# Patient Record
Sex: Female | Born: 1950
Health system: Southern US, Community
[De-identification: ages and names within clinical notes are randomized; demographics above are authoritative.]

## PROBLEM LIST (undated history)

## (undated) DIAGNOSIS — F419 Anxiety disorder, unspecified: Secondary | ICD-10-CM

## (undated) DIAGNOSIS — K8020 Calculus of gallbladder without cholecystitis without obstruction: Secondary | ICD-10-CM

## (undated) DIAGNOSIS — IMO0001 Reserved for inherently not codable concepts without codable children: Secondary | ICD-10-CM

## (undated) DIAGNOSIS — E669 Obesity, unspecified: Secondary | ICD-10-CM

## (undated) DIAGNOSIS — K589 Irritable bowel syndrome without diarrhea: Secondary | ICD-10-CM

## (undated) DIAGNOSIS — I10 Essential (primary) hypertension: Secondary | ICD-10-CM

## (undated) DIAGNOSIS — M5430 Sciatica, unspecified side: Secondary | ICD-10-CM

## (undated) DIAGNOSIS — E785 Hyperlipidemia, unspecified: Secondary | ICD-10-CM

## (undated) HISTORY — DX: Hyperlipidemia, unspecified: E78.5

## (undated) HISTORY — DX: Reserved for inherently not codable concepts without codable children: IMO0001

## (undated) HISTORY — DX: Irritable bowel syndrome, unspecified: K58.9

## (undated) HISTORY — DX: Anxiety disorder, unspecified: F41.9

## (undated) HISTORY — DX: Obesity, unspecified: E66.9

## (undated) HISTORY — DX: Sciatica, unspecified side: M54.30

## (undated) HISTORY — DX: Essential (primary) hypertension: I10

## (undated) HISTORY — DX: Calculus of gallbladder without cholecystitis without obstruction: K80.20

---

## 1997-05-30 ENCOUNTER — Ambulatory Visit (HOSPITAL_COMMUNITY): Admission: RE | Admit: 1997-05-30 | Discharge: 1997-05-30 | Payer: Self-pay | Admitting: Obstetrics & Gynecology

## 1998-06-13 ENCOUNTER — Other Ambulatory Visit: Admission: RE | Admit: 1998-06-13 | Discharge: 1998-06-13 | Payer: Self-pay | Admitting: Obstetrics & Gynecology

## 1999-11-04 ENCOUNTER — Ambulatory Visit (HOSPITAL_COMMUNITY): Admission: RE | Admit: 1999-11-04 | Discharge: 1999-11-04 | Payer: Self-pay | Admitting: Obstetrics & Gynecology

## 1999-11-04 ENCOUNTER — Encounter: Payer: Self-pay | Admitting: Obstetrics & Gynecology

## 1999-11-18 ENCOUNTER — Other Ambulatory Visit: Admission: RE | Admit: 1999-11-18 | Discharge: 1999-11-18 | Payer: Self-pay | Admitting: Obstetrics & Gynecology

## 2000-01-08 ENCOUNTER — Encounter (INDEPENDENT_AMBULATORY_CARE_PROVIDER_SITE_OTHER): Payer: Self-pay

## 2000-01-08 ENCOUNTER — Other Ambulatory Visit: Admission: RE | Admit: 2000-01-08 | Discharge: 2000-01-08 | Payer: Self-pay | Admitting: Obstetrics and Gynecology

## 2000-12-25 ENCOUNTER — Ambulatory Visit (HOSPITAL_COMMUNITY): Admission: RE | Admit: 2000-12-25 | Discharge: 2000-12-25 | Payer: Self-pay | Admitting: Obstetrics and Gynecology

## 2000-12-25 ENCOUNTER — Encounter: Payer: Self-pay | Admitting: Obstetrics and Gynecology

## 2001-03-03 ENCOUNTER — Other Ambulatory Visit: Admission: RE | Admit: 2001-03-03 | Discharge: 2001-03-03 | Payer: Self-pay | Admitting: Obstetrics and Gynecology

## 2002-03-16 ENCOUNTER — Other Ambulatory Visit: Admission: RE | Admit: 2002-03-16 | Discharge: 2002-03-16 | Payer: Self-pay | Admitting: Obstetrics and Gynecology

## 2002-04-05 ENCOUNTER — Encounter: Payer: Self-pay | Admitting: Obstetrics and Gynecology

## 2002-04-05 ENCOUNTER — Ambulatory Visit (HOSPITAL_COMMUNITY): Admission: RE | Admit: 2002-04-05 | Discharge: 2002-04-05 | Payer: Self-pay | Admitting: Obstetrics and Gynecology

## 2003-06-06 ENCOUNTER — Other Ambulatory Visit: Admission: RE | Admit: 2003-06-06 | Discharge: 2003-06-06 | Payer: Self-pay | Admitting: Obstetrics and Gynecology

## 2003-06-08 ENCOUNTER — Ambulatory Visit (HOSPITAL_COMMUNITY): Admission: RE | Admit: 2003-06-08 | Discharge: 2003-06-08 | Payer: Self-pay | Admitting: Obstetrics and Gynecology

## 2004-06-11 ENCOUNTER — Other Ambulatory Visit: Admission: RE | Admit: 2004-06-11 | Discharge: 2004-06-11 | Payer: Self-pay | Admitting: Obstetrics and Gynecology

## 2004-06-28 ENCOUNTER — Ambulatory Visit (HOSPITAL_COMMUNITY): Admission: RE | Admit: 2004-06-28 | Discharge: 2004-06-28 | Payer: Self-pay | Admitting: Obstetrics and Gynecology

## 2004-07-22 ENCOUNTER — Ambulatory Visit: Payer: Self-pay | Admitting: Internal Medicine

## 2004-07-26 ENCOUNTER — Ambulatory Visit: Payer: Self-pay | Admitting: Internal Medicine

## 2004-10-01 ENCOUNTER — Ambulatory Visit: Payer: Self-pay | Admitting: Internal Medicine

## 2004-11-15 ENCOUNTER — Ambulatory Visit: Payer: Self-pay | Admitting: Internal Medicine

## 2005-02-05 ENCOUNTER — Ambulatory Visit: Payer: Self-pay | Admitting: Internal Medicine

## 2005-04-04 ENCOUNTER — Ambulatory Visit: Payer: Self-pay | Admitting: Internal Medicine

## 2005-07-10 ENCOUNTER — Other Ambulatory Visit: Admission: RE | Admit: 2005-07-10 | Discharge: 2005-07-10 | Payer: Self-pay | Admitting: Obstetrics and Gynecology

## 2005-07-28 ENCOUNTER — Ambulatory Visit: Payer: Self-pay | Admitting: Gastroenterology

## 2005-07-28 ENCOUNTER — Ambulatory Visit (HOSPITAL_COMMUNITY): Admission: RE | Admit: 2005-07-28 | Discharge: 2005-07-28 | Payer: Self-pay | Admitting: Obstetrics and Gynecology

## 2005-09-12 ENCOUNTER — Ambulatory Visit: Payer: Self-pay | Admitting: Gastroenterology

## 2005-09-13 ENCOUNTER — Encounter (INDEPENDENT_AMBULATORY_CARE_PROVIDER_SITE_OTHER): Payer: Self-pay | Admitting: Specialist

## 2005-10-21 ENCOUNTER — Ambulatory Visit: Payer: Self-pay | Admitting: Gastroenterology

## 2006-01-14 ENCOUNTER — Ambulatory Visit: Payer: Self-pay | Admitting: Internal Medicine

## 2006-01-22 ENCOUNTER — Ambulatory Visit: Payer: Self-pay | Admitting: Internal Medicine

## 2006-01-27 ENCOUNTER — Encounter: Admission: RE | Admit: 2006-01-27 | Discharge: 2006-01-27 | Payer: Self-pay | Admitting: Internal Medicine

## 2006-02-10 DIAGNOSIS — IMO0001 Reserved for inherently not codable concepts without codable children: Secondary | ICD-10-CM

## 2006-02-10 HISTORY — PX: NM MYOVIEW LTD: HXRAD82

## 2006-02-10 HISTORY — DX: Reserved for inherently not codable concepts without codable children: IMO0001

## 2006-02-16 ENCOUNTER — Ambulatory Visit: Payer: Self-pay

## 2006-02-16 ENCOUNTER — Encounter: Payer: Self-pay | Admitting: Internal Medicine

## 2006-12-31 ENCOUNTER — Telehealth: Payer: Self-pay | Admitting: Internal Medicine

## 2007-01-12 ENCOUNTER — Ambulatory Visit: Payer: Self-pay | Admitting: Internal Medicine

## 2007-01-12 DIAGNOSIS — I10 Essential (primary) hypertension: Secondary | ICD-10-CM | POA: Insufficient documentation

## 2007-01-12 DIAGNOSIS — R29818 Other symptoms and signs involving the nervous system: Secondary | ICD-10-CM | POA: Insufficient documentation

## 2007-01-12 DIAGNOSIS — J309 Allergic rhinitis, unspecified: Secondary | ICD-10-CM | POA: Insufficient documentation

## 2007-01-12 DIAGNOSIS — E785 Hyperlipidemia, unspecified: Secondary | ICD-10-CM | POA: Insufficient documentation

## 2007-02-23 ENCOUNTER — Ambulatory Visit: Payer: Self-pay | Admitting: Internal Medicine

## 2007-02-23 LAB — CONVERTED CEMR LAB
BUN: 12 mg/dL (ref 6–23)
Basophils Relative: 0.5 % (ref 0.0–1.0)
Bilirubin, Direct: 0.2 mg/dL (ref 0.0–0.3)
CO2: 25 meq/L (ref 19–32)
Cholesterol: 172 mg/dL (ref 0–200)
Creatinine,U: 143.4 mg/dL
GFR calc Af Amer: 96 mL/min
Glucose, Bld: 170 mg/dL — ABNORMAL HIGH (ref 70–99)
HCT: 41 % (ref 36.0–46.0)
HDL: 54.3 mg/dL (ref >39.0)
Hemoglobin: 14.6 g/dL (ref 12.0–15.0)
Lymphocytes Relative: 32.4 % (ref 12.0–46.0)
Microalb Creat Ratio: 9.8 mg/g (ref 0.0–30.0)
Microalb, Ur: 1.4 mg/dL (ref 0.0–1.9)
Monocytes Absolute: 0.4 10*3/uL (ref 0.2–0.7)
Monocytes Relative: 6.4 % (ref 3.0–11.0)
Neutro Abs: 4.2 10*3/uL (ref 1.4–7.7)
Neutrophils Relative %: 58.8 % (ref 43.0–77.0)
Potassium: 5.2 meq/L — ABNORMAL HIGH (ref 3.5–5.1)
Sodium: 138 meq/L (ref 135–145)
Total Bilirubin: 0.9 mg/dL (ref 0.3–1.2)
Total Protein: 6.6 g/dL (ref 6.0–8.3)
VLDL: 26 mg/dL (ref 0–40)

## 2007-03-12 ENCOUNTER — Telehealth: Payer: Self-pay | Admitting: Internal Medicine

## 2007-03-22 ENCOUNTER — Ambulatory Visit: Payer: Self-pay | Admitting: Internal Medicine

## 2007-03-22 DIAGNOSIS — E559 Vitamin D deficiency, unspecified: Secondary | ICD-10-CM | POA: Insufficient documentation

## 2007-03-22 DIAGNOSIS — H9209 Otalgia, unspecified ear: Secondary | ICD-10-CM | POA: Insufficient documentation

## 2007-03-22 LAB — CONVERTED CEMR LAB
HDL goal, serum: 40 mg/dL
LDL Goal: 100 mg/dL

## 2007-03-30 ENCOUNTER — Encounter: Payer: Self-pay | Admitting: Internal Medicine

## 2007-04-15 ENCOUNTER — Encounter: Admission: RE | Admit: 2007-04-15 | Discharge: 2007-04-15 | Payer: Self-pay | Admitting: Otolaryngology

## 2007-04-28 ENCOUNTER — Encounter: Payer: Self-pay | Admitting: Internal Medicine

## 2007-06-29 ENCOUNTER — Ambulatory Visit: Payer: Self-pay | Admitting: Internal Medicine

## 2007-06-29 LAB — CONVERTED CEMR LAB: Hgb A1c MFr Bld: 6.9 % — ABNORMAL HIGH (ref 4.6–6.0)

## 2007-07-07 ENCOUNTER — Ambulatory Visit: Payer: Self-pay | Admitting: Internal Medicine

## 2007-07-07 DIAGNOSIS — M25519 Pain in unspecified shoulder: Secondary | ICD-10-CM | POA: Insufficient documentation

## 2007-07-07 DIAGNOSIS — M25559 Pain in unspecified hip: Secondary | ICD-10-CM | POA: Insufficient documentation

## 2007-12-10 ENCOUNTER — Ambulatory Visit: Payer: Self-pay | Admitting: Internal Medicine

## 2007-12-10 LAB — CONVERTED CEMR LAB
Cholesterol: 175 mg/dL (ref 0–200)
Hgb A1c MFr Bld: 7.4 % — ABNORMAL HIGH (ref 4.6–6.0)
Microalb Creat Ratio: 2.6 mg/g (ref 0.0–30.0)
Microalb, Ur: 0.2 mg/dL (ref 0.0–1.9)
Triglycerides: 191 mg/dL — ABNORMAL HIGH (ref 0–149)

## 2007-12-14 ENCOUNTER — Ambulatory Visit: Payer: Self-pay | Admitting: Internal Medicine

## 2008-04-14 ENCOUNTER — Ambulatory Visit: Payer: Self-pay | Admitting: Internal Medicine

## 2008-04-14 LAB — CONVERTED CEMR LAB
AST: 29 units/L (ref 0–37)
Albumin: 3.7 g/dL (ref 3.5–5.2)
Alkaline Phosphatase: 94 units/L (ref 39–117)
Bilirubin, Direct: 0.1 mg/dL (ref 0.0–0.3)
Cholesterol: 184 mg/dL (ref 0–200)
Total CHOL/HDL Ratio: 3.2
Total Protein: 7 g/dL (ref 6.0–8.3)
Triglycerides: 159 mg/dL — ABNORMAL HIGH (ref 0–149)

## 2008-04-28 ENCOUNTER — Ambulatory Visit: Payer: Self-pay | Admitting: Internal Medicine

## 2008-06-05 ENCOUNTER — Telehealth: Payer: Self-pay | Admitting: *Deleted

## 2008-07-12 ENCOUNTER — Ambulatory Visit: Payer: Self-pay | Admitting: Internal Medicine

## 2008-07-12 DIAGNOSIS — K589 Irritable bowel syndrome without diarrhea: Secondary | ICD-10-CM | POA: Insufficient documentation

## 2008-07-12 DIAGNOSIS — M79609 Pain in unspecified limb: Secondary | ICD-10-CM | POA: Insufficient documentation

## 2008-07-17 ENCOUNTER — Ambulatory Visit: Payer: Self-pay | Admitting: Internal Medicine

## 2008-07-21 ENCOUNTER — Ambulatory Visit (HOSPITAL_COMMUNITY): Admission: RE | Admit: 2008-07-21 | Discharge: 2008-07-21 | Payer: Self-pay | Admitting: Obstetrics and Gynecology

## 2008-09-05 ENCOUNTER — Ambulatory Visit: Payer: Self-pay | Admitting: Internal Medicine

## 2008-09-05 LAB — CONVERTED CEMR LAB
BUN: 12 mg/dL (ref 6–23)
CO2: 27 meq/L (ref 19–32)
Creatinine,U: 211.2 mg/dL
GFR calc non Af Amer: 91.9 mL/min (ref >60)
Glucose, Bld: 128 mg/dL — ABNORMAL HIGH (ref 70–99)
Microalb Creat Ratio: 2.4 mg/g (ref 0.0–30.0)
Potassium: 3.9 meq/L (ref 3.5–5.1)
Sodium: 141 meq/L (ref 135–145)
Vit D, 25-Hydroxy: 22 ng/mL — ABNORMAL LOW (ref 30–89)

## 2008-09-14 ENCOUNTER — Telehealth: Payer: Self-pay | Admitting: Internal Medicine

## 2008-09-18 ENCOUNTER — Encounter: Payer: Self-pay | Admitting: Internal Medicine

## 2008-10-09 ENCOUNTER — Ambulatory Visit: Payer: Self-pay | Admitting: Internal Medicine

## 2008-10-09 DIAGNOSIS — M5430 Sciatica, unspecified side: Secondary | ICD-10-CM | POA: Insufficient documentation

## 2008-10-09 DIAGNOSIS — R946 Abnormal results of thyroid function studies: Secondary | ICD-10-CM | POA: Insufficient documentation

## 2008-10-09 DIAGNOSIS — K8020 Calculus of gallbladder without cholecystitis without obstruction: Secondary | ICD-10-CM | POA: Insufficient documentation

## 2008-11-03 ENCOUNTER — Ambulatory Visit: Payer: Self-pay | Admitting: Internal Medicine

## 2008-11-09 LAB — CONVERTED CEMR LAB
AST: 28 units/L (ref 0–37)
Albumin: 3.6 g/dL (ref 3.5–5.2)
Free T4: 0.9 ng/dL (ref 0.6–1.6)
T3, Free: 2.1 pg/mL — ABNORMAL LOW (ref 2.3–4.2)
TSH: 1.06 microintl units/mL (ref 0.35–5.50)
Total Bilirubin: 0.8 mg/dL (ref 0.3–1.2)

## 2008-12-01 ENCOUNTER — Ambulatory Visit: Payer: Self-pay | Admitting: Endocrinology

## 2009-03-21 ENCOUNTER — Telehealth: Payer: Self-pay | Admitting: Internal Medicine

## 2009-03-27 ENCOUNTER — Ambulatory Visit: Payer: Self-pay | Admitting: Internal Medicine

## 2009-03-27 LAB — CONVERTED CEMR LAB
Albumin: 3.5 g/dL (ref 3.5–5.2)
CO2: 27 meq/L (ref 19–32)
Chloride: 106 meq/L (ref 96–112)
Creatinine, Ser: 0.7 mg/dL (ref 0.4–1.2)
Free T4: 1 ng/dL (ref 0.6–1.6)
Hgb A1c MFr Bld: 8 % — ABNORMAL HIGH (ref 4.6–6.5)
Potassium: 4.1 meq/L (ref 3.5–5.1)
Sodium: 138 meq/L (ref 135–145)
T3, Free: 2.8 pg/mL (ref 2.3–4.2)
TSH: 0.92 microintl units/mL (ref 0.35–5.50)
Total Protein: 6.7 g/dL (ref 6.0–8.3)
Vit D, 25-Hydroxy: 44 ng/mL (ref 30–89)

## 2009-04-03 ENCOUNTER — Ambulatory Visit: Payer: Self-pay | Admitting: Internal Medicine

## 2009-04-03 DIAGNOSIS — R05 Cough: Secondary | ICD-10-CM

## 2009-04-03 DIAGNOSIS — R059 Cough, unspecified: Secondary | ICD-10-CM | POA: Insufficient documentation

## 2009-05-25 ENCOUNTER — Ambulatory Visit: Payer: Self-pay | Admitting: Internal Medicine

## 2009-05-25 DIAGNOSIS — M722 Plantar fascial fibromatosis: Secondary | ICD-10-CM | POA: Insufficient documentation

## 2009-09-03 ENCOUNTER — Ambulatory Visit: Payer: Self-pay | Admitting: Internal Medicine

## 2009-09-03 LAB — CONVERTED CEMR LAB
ALT: 22 units/L (ref 0–35)
AST: 21 units/L (ref 0–37)
Albumin: 3.7 g/dL (ref 3.5–5.2)
BUN: 13 mg/dL (ref 6–23)
Cholesterol: 233 mg/dL — ABNORMAL HIGH (ref 0–200)
Creatinine, Ser: 0.7 mg/dL (ref 0.4–1.2)
GFR calc non Af Amer: 94.69 mL/min (ref >60)
Glucose, Bld: 208 mg/dL — ABNORMAL HIGH (ref 70–99)
HDL: 57 mg/dL (ref >39.00)
Hgb A1c MFr Bld: 7.4 % — ABNORMAL HIGH (ref 4.6–6.5)
Microalb Creat Ratio: 1.1 mg/g (ref 0.0–30.0)
Microalb, Ur: 2.6 mg/dL — ABNORMAL HIGH (ref 0.0–1.9)
Potassium: 4.8 meq/L (ref 3.5–5.1)
VLDL: 57 mg/dL — ABNORMAL HIGH (ref 0.0–40.0)

## 2009-09-10 ENCOUNTER — Ambulatory Visit: Payer: Self-pay | Admitting: Internal Medicine

## 2009-09-10 DIAGNOSIS — E669 Obesity, unspecified: Secondary | ICD-10-CM | POA: Insufficient documentation

## 2010-01-14 ENCOUNTER — Ambulatory Visit: Payer: Self-pay | Admitting: Internal Medicine

## 2010-01-14 LAB — CONVERTED CEMR LAB
AST: 31 units/L (ref 0–37)
Albumin: 3.6 g/dL (ref 3.5–5.2)
Alkaline Phosphatase: 98 units/L (ref 39–117)
Cholesterol: 221 mg/dL — ABNORMAL HIGH (ref 0–200)
Direct LDL: 132.6 mg/dL
Total Protein: 6.4 g/dL (ref 6.0–8.3)

## 2010-01-21 ENCOUNTER — Ambulatory Visit: Payer: Self-pay | Admitting: Internal Medicine

## 2010-01-21 DIAGNOSIS — Z9119 Patient's noncompliance with other medical treatment and regimen: Secondary | ICD-10-CM | POA: Insufficient documentation

## 2010-03-03 ENCOUNTER — Encounter: Payer: Self-pay | Admitting: Neurology

## 2010-03-03 ENCOUNTER — Encounter: Payer: Self-pay | Admitting: Otolaryngology

## 2010-03-10 LAB — CONVERTED CEMR LAB
AST: 19 units/L (ref 0–37)
BUN: 12 mg/dL (ref 6–23)
Basophils Absolute: 0.1 10*3/uL (ref 0.0–0.1)
Creatinine,U: 45.1 mg/dL
Glomerular Filtration Rate, Af Am: 96 mL/min/{1.73_m2}
HCT: 43.5 % (ref 36.0–46.0)
Hemoglobin: 14.9 g/dL (ref 12.0–15.0)
MCHC: 34.2 g/dL (ref 30.0–36.0)
MCV: 90.4 fL (ref 78.0–100.0)
Microalb, Ur: 0.2 mg/dL (ref 0.0–1.9)
Monocytes Absolute: 0.6 10*3/uL (ref 0.2–0.7)
Neutrophils Relative %: 63.4 % (ref 43.0–77.0)
Potassium: 3.7 meq/L (ref 3.5–5.1)
Pro B Natriuretic peptide (BNP): 7 pg/mL (ref 0.0–100.0)
TSH: 0.52 microintl units/mL (ref 0.35–5.50)

## 2010-03-14 NOTE — Assessment & Plan Note (Signed)
Summary: 3 month ov//ccm/pt rsc/cjr   Vital Signs:  Patient profile:   60 year old female Menstrual status:  postmenopausal Height:      63 inches Weight:      205 pounds BMI:     36.45 Pulse rate:   80 / minute BP sitting:   112 / 70  (left arm) Cuff size:   large  Vitals Entered By: Romualdo Bolk, CMA (AAMA) (January 21, 2010 9:02 AM) CC: Follow-up visit on labs, Hypertension Management   History of Present Illness: [name] [name] comes in today  for follow up of multiple medical problems . last visit was August 2011.  LIPIDS: taking meds ?  how often no se  DM: not taking the onglyza   cost lus other reasongs  No low s   COUGH :  x 2 weeks  began as runny nose  and inc in am  trying delsym.     no fever cp sob.  denoes med effect  HT :  controlled?Marland Kitchen   Hypertension History:      She denies headache, chest pain, palpitations, dyspnea with exertion, orthopnea, PND, peripheral edema, visual symptoms, neurologic problems, syncope, and side effects from treatment.  She notes no problems with any antihypertensive medication side effects.        Positive major cardiovascular risk factors include female age 58 years old or older, diabetes, hyperlipidemia, and hypertension.  Negative major cardiovascular risk factors include non-tobacco-user status.        Further assessment for target organ damage reveals no history of ASHD, stroke/TIA, or peripheral vascular disease.      Preventive Screening-Counseling & Management  Alcohol-Tobacco     Alcohol drinks/day: 0     Smoking Status: never  Caffeine-Diet-Exercise     Caffeine use/day: 2     Does Patient Exercise: no  Current Medications (verified): 1)  Freestyle Test   Strp (Glucose Blood) .... Use As Directed 2)  Freestyle Unistick Ii Lancets   Misc (Lancets) .... Use As Directed 3)  Amaryl 2 Mg  Tabs (Glimepiride) .Marland Kitchen.. 1 By Mouth Once Daily 4)  Actoplus Met 15-850 Mg  Tabs (Pioglitazone Hcl-Metformin Hcl) .Marland Kitchen.. 1 By Mouth  Two Times A Day 5)  Multivitamins   Tabs (Multiple Vitamin) 6)  Zocor 20 Mg Tabs (Simvastatin) .Marland Kitchen.. 1 By Mouth Once Daily 7)  Glycopyrrolate 2 Mg Tabs (Glycopyrrolate) .Marland Kitchen.. 1 By Mouth Two Times A Day Ibs 8)  Arthrotec 75 75-200 Mg-Mcg Tabs (Diclofenac-Misoprostol) .... As Needed 9)  Losartan Potassium 50 Mg Tabs (Losartan Potassium) .Marland Kitchen.. 1 By Mouth Once Daily For Blood Pressure 10)  Onglyza 5 Mg Tabs (Saxagliptin Hcl) .Marland Kitchen.. 1 By Mouth Once Daily  Allergies (verified): 1)  Naprosyn  Past History:  Past medical, surgical, family and social histories (including risk factors) reviewed, and no changes noted (except as noted below).  Past Medical History: Reviewed history from 10/09/2008 and no changes required. Allergic rhinitis Diabetes mellitus, type II Hyperlipidemia Hypertension myoview low risk study  1/08  IBS  Sciatica   Past Surgical History: Reviewed history from 03/22/2007 and no changes required. C-Sections x [phone]  Past History:  Care Management: Neurology: [facility] Gastroenterology: Dr. Russella Dar Gynecology: Dr. Pennie Rushing Orthopedics: [facility]  Family History: Reviewed history from 12/01/2008 and no changes required. [place] Family History Lung cancer  MGF   MGM Family History Diabetes 1st degree relative father , PGM  Family History Hypertension   MOM died when pt was 71 of AML aunt with  breast cancer age 49 no thyroid problem  Social History: Reviewed history from 05/25/2009 and no changes required. Never Smoked separated single mom Alcohol use-no Drug use-no Regular exercise-no hh of 4  works for [employer] co   sits all day at terminal works [employer]  Review of Systems  The patient denies anorexia, fever, weight loss, weight gain, vision loss, decreased hearing, hoarseness, chest pain, headaches, abdominal pain, melena, hematochezia, severe indigestion/heartburn, transient blindness, difficulty walking, enlarged lymph nodes, angioedema, and  breast masses.         no falling   Physical Exam  General:  Well-developed,well-nourished,in no acute distress; alert,appropriate and cooperative throughout examination Head:  Normocephalic and atraumatic without obvious abnormalities. No apparent alopecia or balding. Eyes:  clear  without discharge  Ears:  R ear normal, L ear normal, and no external deformities.   Nose:  no external deformity and no nasal discharge.   Mouth:  pharynx pink and moist.   Neck:  No deformities, masses, or tenderness noted. Lungs:  Normal respiratory effort, chest expands symmetrically. Lungs are clear to auscultation, no crackles or wheezes.no dullness.   Heart:  Normal rate and regular rhythm. S1 and S2 normal without gallop, murmur, click, rub or other extra sounds.no lifts.   Abdomen:  Bowel sounds positive,abdomen soft and non-tender without masses, organomegaly or hernias noted. Pulses:  pulses intact without delay   Neurologic:  grossly non focal  Cervical Nodes:  No lymphadenopathy noted Psych:  Oriented X3, normally interactive, good eye contact, not anxious appearing, and not depressed appearing.     Impression & Recommendations:  Problem # 1:  DIABETES MELLITUS, TYPE II (ICD-250.00) Assessment Deteriorated compliance/ adherance an issues for various reasons    actos expensive  a nd  risk  asks about  options . Last eye check 2009  should get a n updated one.   consider  swith to plain metformin  but  lifestyle intervention central to success. Her updated medication list for this problem includes:    Amaryl 2 Mg Tabs (Glimepiride) .Marland Kitchen... 1 by mouth once daily    Actoplus Met 15-850 Mg Tabs (Pioglitazone hcl-metformin hcl) .Marland Kitchen... 1 by mouth two times a day    Losartan Potassium 50 Mg Tabs (Losartan potassium) .Marland Kitchen... 1 by mouth once daily for blood pressure    Onglyza 5 Mg Tabs (Saxagliptin hcl) .Marland Kitchen... 1 by mouth once daily  Labs Reviewed: Creat: 0.7 (09/03/2009)     Last Eye Exam: normal  (07/12/2007) Reviewed HgBA1c results: 8.3 (01/14/2010)  7.4 (09/03/2009)  Problem # 2:  HYPERTENSION (ICD-401.9) Assessment: Unchanged  Her updated medication list for this problem includes:    Losartan Potassium 50 Mg Tabs (Losartan potassium) .Marland Kitchen... 1 by mouth once daily for blood pressure  BP today: 112/70 Prior BP: 130/80 (09/10/2009)  10 Yr Risk Heart Disease: 7 % Prior 10 Yr Risk Heart Disease: 11 % (04/28/2008)  Labs Reviewed: K+: 4.8 (09/03/2009) Creat: : 0.7 (09/03/2009)   Chol: 221 (01/14/2010)   HDL: 57.70 (01/14/2010)   LDL: 95 (04/14/2008)   TG: 139.0 (01/14/2010)  Problem # 3:  COUGH (ICD-786.2) Assessment: New ? viral vs allergic   cause.Marland Kitchen u pperairway  syndrome ..   disc rx options  Problem # 4:  HYPERLIPIDEMIA (ICD-272.4)  Her updated medication list for this problem includes:    Zocor 20 Mg Tabs (Simvastatin) .Marland Kitchen... 1 by mouth once daily  Labs Reviewed: SGOT: 31 (01/14/2010)   SGPT: 36 (01/14/2010)  Lipid Goals: Chol Goal:  200 (03/22/2007)   HDL Goal: 40 (03/22/2007)   LDL Goal: 100 (03/22/2007)   TG Goal: 150 (03/22/2007)  10 Yr Risk Heart Disease: 7 % Prior 10 Yr Risk Heart Disease: 11 % (04/28/2008)   HDL:57.70 (01/14/2010), 57.00 (09/03/2009)  LDL:95 (04/14/2008), 82 (12/10/2007)  Chol:221 (01/14/2010), 233 (09/03/2009)  Trig:139.0 (01/14/2010), 285.0 (09/03/2009)  Problem # 5:  NON ADHERENCE  TO MEDICATION (ICD-V15.81) Assessment: New reviewed interventions  to ensure adherance/ compliance   Complete Medication List: 1)  Freestyle Test Strp (Glucose blood) .... Use as directed 2)  Freestyle Unistick Ii Lancets Misc (Lancets) .... Use as directed 3)  Amaryl 2 Mg Tabs (Glimepiride) .Marland Kitchen.. 1 by mouth once daily 4)  Actoplus Met 15-850 Mg Tabs (Pioglitazone hcl-metformin hcl) .Marland Kitchen.. 1 by mouth two times a day 5)  Multivitamins Tabs (Multiple vitamin) 6)  Zocor 20 Mg Tabs (Simvastatin) .Marland Kitchen.. 1 by mouth once daily 7)  Glycopyrrolate 2 Mg Tabs  (Glycopyrrolate) .Marland Kitchen.. 1 by mouth two times a day ibs 8)  Arthrotec 75 75-200 Mg-mcg Tabs (Diclofenac-misoprostol) .... As needed 9)  Losartan Potassium 50 Mg Tabs (Losartan potassium) .Marland Kitchen.. 1 by mouth once daily for blood pressure 10)  Onglyza 5 Mg Tabs (Saxagliptin hcl) .Marland Kitchen.. 1 by mouth once daily 11)  Hydrocodone-homatropine 5-1.5 Mg/71ml Syrp (Hydrocodone-homatropine) .Marland Kitchen.. 1-2 tsp by mouth q4-6 hours as needed cough  Other Orders: Admin 1st Vaccine (95188) Flu Vaccine 50yrs + (41660) Pneumococcal Vaccine (63016) Admin of Any Addtl Vaccine (01093)  Hypertension Assessment/Plan:      The patient's hypertensive risk group is category C: Target organ damage and/or diabetes.  Her calculated 10 year risk of coronary heart disease is 7 %.  Today's blood pressure is 112/70.  Her blood pressure goal is < 130/80.  Patient Instructions: 1)  start the onglyza . 2)  if want to  can change to plain metformin  call for this if want to stop the actos  3)  Cough med as needed antihistamine  at night may help for dripping.  4)  Take meds regularly.  5)  LIPDS , Hg a1c , LFTS , BMP, in 3 months   6)  ROV in 3 months  Prescriptions: HYDROCODONE-HOMATROPINE 5-1.5 MG/5ML SYRP (HYDROCODONE-HOMATROPINE) 1-2 tsp by mouth q4-6 hours as needed cough  #6 oz x 0   Entered and Authorized by:   Madelin Headings MD   Signed by:   Madelin Headings MD on 01/21/2010   Method used:   Print then Give to Patient   RxID:   [id number] ONGLYZA 5 MG TABS (SAXAGLIPTIN HCL) 1 by mouth once daily  #30 x 6   Entered and Authorized by:   Madelin Headings MD   Signed by:   Madelin Headings MD on 01/21/2010   Method used:   Print then Give to Patient   RxID:   [id number]    Orders Added: 1)  Admin 1st Vaccine [90471] 2)  Flu Vaccine 38yrs + [37106] 3)  Pneumococcal Vaccine [26948] 4)  Admin of Any Addtl Vaccine [90472] 5)  Est. Patient Level IV [54627]   Immunizations Administered:  Pneumonia Vaccine:    Vaccine  Type: Pneumovax    Site: right deltoid    Mfr: Merck    Dose: 0.5 ml    Route: IM    Given by: Romualdo Bolk, CMA (AAMA)    Exp. Date: 06/12/2011    Lot #: [id number]    Immunizations Administered:  Pneumonia Vaccine:  Vaccine Type: Pneumovax    Site: right deltoid    Mfr: Merck    Dose: 0.5 ml    Route: IM    Given by: Romualdo Bolk, CMA (AAMA)    Exp. Date: 06/12/2011    Lot #: [id number] Flu Vaccine Consent Questions     Do you have a history of severe allergic reactions to this vaccine? no    Any prior history of allergic reactions to egg and/or gelatin? no    Do you have a sensitivity to the preservative Thimersol? no    Do you have a past history of Guillan-Barre Syndrome? no    Do you currently have an acute febrile illness? no    Have you ever had a severe reaction to latex? no    Vaccine information given and explained to patient? yes    Are you currently pregnant? no    Lot Number:[id number]   Exp Date:08/10/2010   Site Given  Left Deltoid IM Romualdo Bolk, CMA (AAMA)  January 21, 2010 9:06 AM  .lbflu

## 2010-03-14 NOTE — Assessment & Plan Note (Signed)
Summary: 2 MONTH ROV/NJR PT RSC/NJR   Vital Signs:  Patient profile:   60 year old female Menstrual status:  postmenopausal Weight:      206 pounds Pulse rate:   60 / minute BP sitting:   130 / 80  (left arm) Cuff size:   regular  Vitals Entered By: Romualdo Bolk, CMA (AAMA) (September 10, 2009 1:14 PM) CC: Follow-up visit on labs, Hypertension Management   History of Present Illness: [name] [name] comes in today  for diabetes managment  and multiple medical problems   LIPIDs  didnt take the zocor  lost the rx  DM  ran out of onglyza a month ago and didnt get med filled .  HT : see above  doing ok  No change in vision  csp sob neuro  infecctions  ... No reported se of meds.    Hypertension History:      She complains of peripheral edema, but denies headache, chest pain, palpitations, dyspnea with exertion, orthopnea, PND, visual symptoms, neurologic problems, syncope, and side effects from treatment.  She notes no problems with any antihypertensive medication side effects.  Rt ankle swelling.        Positive major cardiovascular risk factors include female age 106 years old or older, diabetes, hyperlipidemia, and hypertension.  Negative major cardiovascular risk factors include non-tobacco-user status.        Further assessment for target organ damage reveals no history of ASHD, stroke/TIA, or peripheral vascular disease.      Preventive Screening-Counseling & Management  Alcohol-Tobacco     Alcohol drinks/day: 0     Smoking Status: never  Caffeine-Diet-Exercise     Caffeine use/day: 2     Does Patient Exercise: no  Current Medications (verified): 1)  Freestyle Test   Strp (Glucose Blood) .... Use As Directed 2)  Freestyle Unistick Ii Lancets   Misc (Lancets) .... Use As Directed 3)  Amaryl 2 Mg  Tabs (Glimepiride) .Marland Kitchen.. 1 By Mouth Once Daily 4)  Actoplus Met 15-850 Mg  Tabs (Pioglitazone Hcl-Metformin Hcl) .Marland Kitchen.. 1 By Mouth Two Times A Day 5)  Vitamin D 1000 Unit  Tabs (Cholecalciferol) 6)  Zocor 20 Mg Tabs (Simvastatin) .Marland Kitchen.. 1 By Mouth Once Daily 7)  Glycopyrrolate 2 Mg Tabs (Glycopyrrolate) .Marland Kitchen.. 1 By Mouth Two Times A Day Ibs 8)  Arthrotec 75 75-200 Mg-Mcg Tabs (Diclofenac-Misoprostol) .... As Needed 9)  Losartan Potassium 50 Mg Tabs (Losartan Potassium) .Marland Kitchen.. 1 By Mouth Once Daily For Blood Pressure 10)  Onglyza 5 Mg Tabs (Saxagliptin Hcl) .Marland Kitchen.. 1 By Mouth Once Daily  Allergies (verified): 1)  Naprosyn  Past History:  Past medical, surgical, family and social histories (including risk factors) reviewed, and no changes noted (except as noted below).  Past Medical History: Reviewed history from 10/09/2008 and no changes required. Allergic rhinitis Diabetes mellitus, type II Hyperlipidemia Hypertension myoview low risk study  1/08  IBS  Sciatica   Past Surgical History: Reviewed history from 03/22/2007 and no changes required. C-Sections x [phone]  Past History:  Care Management: Neurology: [facility] Gastroenterology: Dr. Russella Dar Gynecology: Dr. Pennie Rushing Orthopedics: [facility]  Family History: Reviewed history from 12/01/2008 and no changes required. [place] Family History Lung cancer  MGF   MGM Family History Diabetes 1st degree relative father , PGM  Family History Hypertension   MOM died when pt was 81 of AML aunt with breast cancer age 34 no thyroid problem  Social History: Reviewed history from 05/25/2009 and no changes required. Never  Smoked separated single mom Alcohol use-no Drug use-no Regular exercise-no hh of 4  works for [employer] co   sits all day at terminal works [employer]  Review of Systems  The patient denies anorexia, fever, weight loss, vision loss, chest pain, syncope, abdominal pain, melena, hematochezia, severe indigestion/heartburn, hematuria, muscle weakness, transient blindness, unusual weight change, enlarged lymph nodes, and angioedema.         tired and doesnt sleep well   Physical  Exam  General:  alert, well-developed, and well-nourished.   Head:  normocephalic and atraumatic.   Eyes:  vision grossly intact, pupils equal, and pupils round.   Neck:  No deformities, masses, or tenderness noted. Lungs:  Normal respiratory effort, chest expands symmetrically. Lungs are clear to auscultation, no crackles or wheezes. Heart:  Normal rate and regular rhythm. S1 and S2 normal without gallop, murmur, click, rub or other extra sounds. Abdomen:  Bowel sounds positive,abdomen soft and non-tender without masses, organomegaly or noted. Pulses:  pulses intact without delay   Extremities:  . Neurologic:  non focal Skin:  turgor normal, color normal, no ecchymoses, and no petechiae.   Cervical Nodes:  No lymphadenopathy noted Psych:  Oriented X3, normally interactive, good eye contact, not anxious appearing, and not depressed appearing.    Diabetes Management Exam:    Eye Exam:       Eye Exam done elsewhere          Date: 07/12/2007          Results: normal          Done by: Kerin Perna labs reviewed with patient  Impression & Recommendations:  Problem # 1:  DIABETES MELLITUS, TYPE II (ICD-250.00) rans out of onglyza and didnt continue for thpast month  however still better a1c  The following medications were removed from the medication list:    Lisinopril 10 Mg Tabs (Lisinopril) .Marland Kitchen... 1 by mouth once daily Her updated medication list for this problem includes:    Amaryl 2 Mg Tabs (Glimepiride) .Marland Kitchen... 1 by mouth once daily    Actoplus Met 15-850 Mg Tabs (Pioglitazone hcl-metformin hcl) .Marland Kitchen... 1 by mouth two times a day    Losartan Potassium 50 Mg Tabs (Losartan potassium) .Marland Kitchen... 1 by mouth once daily for blood pressure    Onglyza 5 Mg Tabs (Saxagliptin hcl) .Marland Kitchen... 1 by mouth once daily  Labs Reviewed: Creat: 0.7 (09/03/2009)     Last Eye Exam: normal (07/12/2007) Reviewed HgBA1c results: 7.4 (09/03/2009)  8.0 (03/27/2009)  Problem # 2:  HYPERLIPIDEMIA (ICD-272.4) didnt  start med yet    disc   Her updated medication list for this problem includes:    Zocor 20 Mg Tabs (Simvastatin) .Marland Kitchen... 1 by mouth once daily  Labs Reviewed: SGOT: 21 (09/03/2009)   SGPT: 22 (09/03/2009)  Lipid Goals: Chol Goal: 200 (03/22/2007)   HDL Goal: 40 (03/22/2007)   LDL Goal: 100 (03/22/2007)   TG Goal: 150 (03/22/2007)  Prior 10 Yr Risk Heart Disease: 11 % (04/28/2008)   HDL:57.00 (09/03/2009), 57.0 (04/14/2008)  LDL:95 (04/14/2008), 82 (12/10/2007)  Chol:233 (09/03/2009), 184 (04/14/2008)  Trig:285.0 (09/03/2009), 159 (04/14/2008)  Problem # 3:  HYPERTENSION (ICD-401.9)  The following medications were removed from the medication list:    Lisinopril 10 Mg Tabs (Lisinopril) .Marland Kitchen... 1 by mouth once daily Her updated medication list for this problem includes:    Losartan Potassium 50 Mg Tabs (Losartan potassium) .Marland Kitchen... 1 by mouth once daily for blood pressure  BP today: 130/80 Prior  BP: 122/80 (05/25/2009)  Prior 10 Yr Risk Heart Disease: 11 % (04/28/2008)  Labs Reviewed: K+: 4.8 (09/03/2009) Creat: : 0.7 (09/03/2009)   Chol: 233 (09/03/2009)   HDL: 57.00 (09/03/2009)   LDL: 95 (04/14/2008)   TG: 285.0 (09/03/2009)  Problem # 4:  OBESITY (ICD-278.00)  Ht: 63 (12/01/2008)   Wt: 206 (09/10/2009)   BMI: 35.73 (04/03/2009)  Complete Medication List: 1)  Freestyle Test Strp (Glucose blood) .... Use as directed 2)  Freestyle Unistick Ii Lancets Misc (Lancets) .... Use as directed 3)  Amaryl 2 Mg Tabs (Glimepiride) .Marland Kitchen.. 1 by mouth once daily 4)  Actoplus Met 15-850 Mg Tabs (Pioglitazone hcl-metformin hcl) .Marland Kitchen.. 1 by mouth two times a day 5)  Vitamin D 1000 Unit Tabs (Cholecalciferol) 6)  Zocor 20 Mg Tabs (Simvastatin) .Marland Kitchen.. 1 by mouth once daily 7)  Glycopyrrolate 2 Mg Tabs (Glycopyrrolate) .Marland Kitchen.. 1 by mouth two times a day ibs 8)  Arthrotec 75 75-200 Mg-mcg Tabs (Diclofenac-misoprostol) .... As needed 9)  Losartan Potassium 50 Mg Tabs (Losartan potassium) .Marland Kitchen.. 1 by mouth once daily  for blood pressure 10)  Onglyza 5 Mg Tabs (Saxagliptin hcl) .Marland Kitchen.. 1 by mouth once daily  Hypertension Assessment/Plan:      The patient's hypertensive risk group is category C: Target organ damage and/or diabetes.  Her calculated 10 year risk of coronary heart disease is 11 %.  Today's blood pressure is 130/80.  Her blood pressure goal is < 130/80.  Patient Instructions: 1)  you need to lose weight   to help all of the medical problems . 2)  Restart the onglyza  and  take a lipid med .  3)  Hepatic Panel prior to visit ICD-9:     4)  Lipid panel prior to visit ICD-9 :    272.4  5)  HgBA1c prior to visit  ICD-9:  6)  rov in 3 months  Prescriptions: ZOCOR 20 MG TABS (SIMVASTATIN) 1 by mouth once daily  #30 x 6   Entered and Authorized by:   Madelin Headings MD   Signed by:   Madelin Headings MD on 09/10/2009   Method used:   Electronically to        [facility]  [phone]* (retail)       [address]       [place], [place]  [address]       Ph: [phone] or [phone]       Fax: [phone]   RxID:   [id number]  coupon  and samplr of onglyza

## 2010-03-14 NOTE — Progress Notes (Signed)
Summary: Pt req to add blood sugar check to TSH  Free T3 Free T4  and Lft  Phone Note Call from Patient Call back at Work Phone [phone]   Caller: Patient Summary of Call: Pt has scheduled labs for TSH  Free T3 Free T4  and Lfts per your pt instuction from ov in Aug 2010. Pt would like to add lab to get Blood Sugar checked and Vit D recheck. Please advise.  Initial call taken by: Lucy Antigua,  March 21, 2009 3:33 PM  Follow-up for Phone Call        yes add hg a1c and bmp and vit d     dx diabetes  and vit d deficiency Follow-up by: Madelin Headings MD,  March 22, 2009 10:18 AM  Additional Follow-up for Phone Call Additional follow up Details #1::        I added the labs to pts scheduled lab appt, as noted above per Dr. Fabian Sharp.  Additional Follow-up by: Lucy Antigua,  March 22, 2009 11:43 AM

## 2010-03-14 NOTE — Assessment & Plan Note (Signed)
Summary: 2 month fup//ccm/pt rsc/cjr   Vital Signs:  Patient profile:   60 year old female Menstrual status:  postmenopausal Weight:      200 pounds BP sitting:   122 / 80  (left arm) Cuff size:   regular  Vitals Entered By: Raechel Ache, RN (May 25, 2009 3:23 PM) CC: 2 mo ROV, c/o heel pain. Is Patient Diabetic? Yes   History of Present Illness: [name] [name] comesin comes in today  for follow up of multiple medical problems .  Had problem with both heels   . CO of 6 monthsof symptom when steps in the am.  has tried inserts and stretchiing but still a problem  DM   HAd gotten worse    and Bg  high after a meal s    up to 200 and now getting better after getting onglyza  . NO lows  No se of meds  BP stable LIPIDS: NO change  meds    Preventive Screening-Counseling & Management  Alcohol-Tobacco     Alcohol drinks/day: 0     Smoking Status: never  Caffeine-Diet-Exercise     Caffeine use/day: 2     Does Patient Exercise: no  Allergies: 1)  Naprosyn  Past History:  Past medical, surgical, family and social histories (including risk factors) reviewed, and no changes noted (except as noted below).  Past Medical History: Reviewed history from 10/09/2008 and no changes required. Allergic rhinitis Diabetes mellitus, type II Hyperlipidemia Hypertension myoview low risk study  1/08  IBS  Sciatica   Past Surgical History: Reviewed history from 03/22/2007 and no changes required. C-Sections x [phone]  Family History: Reviewed history from 12/01/2008 and no changes required. [place] Family History Lung cancer  MGF   MGM Family History Diabetes 1st degree relative father , PGM  Family History Hypertension   MOM died when pt was 58 of AML aunt with breast cancer age 2 no thyroid problem  Social History: Reviewed history from 07/12/2008 and no changes required. Never Smoked separated single mom Alcohol use-no Drug use-no Regular exercise-no hh of 4  works  for [employer]   sits all day at terminal works [employer]  Review of Systems  The patient denies anorexia, fever, weight loss, weight gain, vision loss, decreased hearing, chest pain, syncope, dyspnea on exertion, abdominal pain, abnormal bleeding, and enlarged lymph nodes.    Physical Exam  General:  Well-developed,well-nourished,in no acute distress; alert,appropriate and cooperative throughout examination Neck:  No deformities, masses, or tenderness noted. Lungs:  normal respiratory effort, no intercostal retractions, and no accessory muscle use.   Heart:  normal rate and regular rhythm.   Extremities:  no clubbing cyanosis or edema  tender at both heels no deformity there .   Neurologic:  alert & oriented X3 and strength normal in all extremities.   Skin:  turgor normal, color normal, no ecchymoses, and no petechiae.   Cervical Nodes:  No lymphadenopathy noted Psych:  Oriented X3, good eye contact, not anxious appearing, and not depressed appearing.    Diabetes Management Exam:    Foot Exam (with socks and/or shoes not present):       Sensory-Monofilament:          Left foot: normal          Right foot: normal   Impression & Recommendations:  Problem # 1:  DIABETES MELLITUS, TYPE II (ICD-250.00) improved from last visit    reviewed goals and lifestyle intervention   cross  training .   continue on onglyza and samples given. HO x 2  Her updated medication list for this problem includes:    Lisinopril 10 Mg Tabs (Lisinopril) .Marland Kitchen... 1 by mouth once daily    Amaryl 2 Mg Tabs (Glimepiride) .Marland Kitchen... 1 by mouth once daily    Actoplus Met 15-850 Mg Tabs (Pioglitazone hcl-metformin hcl) .Marland Kitchen... 1 by mouth two times a day    Losartan Potassium 50 Mg Tabs (Losartan potassium) .Marland Kitchen... 1 by mouth once daily for blood pressure    Onglyza 5 Mg Tabs (Saxagliptin hcl) .Marland Kitchen... 1 by mouth once daily  Problem # 2:  HYPERTENSION (ICD-401.9)  Her updated medication list for this problem  includes:    Lisinopril 10 Mg Tabs (Lisinopril) .Marland Kitchen... 1 by mouth once daily    Losartan Potassium 50 Mg Tabs (Losartan potassium) .Marland Kitchen... 1 by mouth once daily for blood pressure  Problem # 3:  HYPERLIPIDEMIA (ICD-272.4)  Her updated medication list for this problem includes:    Zocor 20 Mg Tabs (Simvastatin) .Marland Kitchen... 1 by mouth once daily  Problem # 4:  PLANTAR FASCIITIS, BILATERAL (ICD-728.71) rx reviewed  and already has instituted some    have ortho    Her updated medication list for this problem includes:    Arthrotec 75 75-200 Mg-mcg Tabs (Diclofenac-misoprostol) .Marland Kitchen... As needed  Complete Medication List: 1)  Lisinopril 10 Mg Tabs (Lisinopril) .Marland Kitchen.. 1 by mouth once daily 2)  Freestyle Test Strp (Glucose blood) .... Use as directed 3)  Freestyle Unistick Ii Lancets Misc (Lancets) .... Use as directed 4)  Amaryl 2 Mg Tabs (Glimepiride) .Marland Kitchen.. 1 by mouth once daily 5)  Actoplus Met 15-850 Mg Tabs (Pioglitazone hcl-metformin hcl) .Marland Kitchen.. 1 by mouth two times a day 6)  Drisdol 16109 Unit Caps (Ergocalciferol) .Marland Kitchen.. 1 by mouth q week every other week 7)  Zocor 20 Mg Tabs (Simvastatin) .Marland Kitchen.. 1 by mouth once daily 8)  Glycopyrrolate 2 Mg Tabs (Glycopyrrolate) .Marland Kitchen.. 1 by mouth two times a day ibs 9)  Methocarbamol 500 Mg Tabs (Methocarbamol) 10)  Arthrotec 75 75-200 Mg-mcg Tabs (Diclofenac-misoprostol) .... As needed 11)  Losartan Potassium 50 Mg Tabs (Losartan potassium) .Marland Kitchen.. 1 by mouth once daily for blood pressure 12)  Onglyza 5 Mg Tabs (Saxagliptin hcl) .Marland Kitchen.. 1 by mouth once daily  Patient Instructions: 1)  i think you have  plantar fasciitis and  se e Dr Prince Rome  for opinion.  2)  Please schedule a follow-up appointment in 2 months.  3)  BMP prior to visit, ICD-9:  4)  Hepatic Panel prior to visit ICD-9:  5)  Lipid panel prior to visit ICD-9 :  6)  HgBA1c prior to visit  ICD-9:  7)  Urine Microalbumin prior to visit ICD-9 :  Prescriptions: FREESTYLE UNISTICK II LANCETS   MISC (LANCETS) use as  directed  #100 x 11   Entered and Authorized by:   Madelin Headings MD   Signed by:   Madelin Headings MD on 05/25/2009   Method used:   Print then Give to Patient   RxID:   [id number] FREESTYLE TEST   STRP (GLUCOSE BLOOD) use as directed  #100 x 11   Entered and Authorized by:   Madelin Headings MD   Signed by:   Madelin Headings MD on 05/25/2009   Method used:   Print then Give to Patient   RxID:   [id number]   Prevention & Chronic Care Immunizations   Influenza vaccine: Historical  (05/11/2008)  Influenza vaccine due: 01/12/2008    Tetanus booster: 02/10/2005: Historical    Pneumococcal vaccine: Not documented  Colorectal Screening   Hemoccult: Not documented    Colonoscopy: normal  (02/10/2006)  Other Screening   Pap smear: Not documented    Mammogram: Not documented   Smoking status: never  (05/25/2009)  Diabetes Mellitus   HgbA1C: 8.0  (03/27/2009)   Hemoglobin A1C due: 07/15/2008    Eye exam: Not documented    Foot exam: yes  (05/25/2009)   High risk foot: Not documented   Foot care education: Not documented    Urine microalbumin/creatinine ratio: 2.4  (09/05/2008)   Urine microalbumin/cr due: 12/09/2008    Diabetes flowsheet reviewed?: Yes   Progress toward A1C goal: Improved  Lipids   Total Cholesterol: 184  (04/14/2008)   LDL: 95  (04/14/2008)   LDL Direct: Not documented   HDL: 57.0  (04/14/2008)   Triglycerides: 159  (04/14/2008)    SGOT (AST): 23  (03/27/2009)   SGPT (ALT): 26  (03/27/2009)   Alkaline phosphatase: 88  (03/27/2009)   Total bilirubin: 0.6  (03/27/2009)  Hypertension   Last Blood Pressure: 122 / 80  (05/25/2009)   Serum creatinine: 0.7  (03/27/2009)   Serum potassium 4.1  (03/27/2009)  Self-Management Support :    Diabetes self-management support: Not documented    Hypertension self-management support: Not documented    Lipid self-management support: Not documented    greater than 50% of visit spent in  counseling  25 minutes

## 2010-03-14 NOTE — Assessment & Plan Note (Signed)
Summary: follow up/cjr   Vital Signs:  Patient profile:   60 year old female Menstrual status:  postmenopausal Weight:      201 pounds BMI:     35.73 O2 Sat:      98 % on Room air Temp:     98 degrees F oral Pulse rate:   104 / minute BP sitting:   118 / 74  (left arm)  Vitals Entered By: Gladis Riffle, RN (April 03, 2009 3:30 PM)  O2 Flow:  Room air CC: FU, labs done--needs Rx for most meds and is going to [facility] tomorrow so neds done today Is Patient Diabetic? Yes Did you bring your meter with you today? No Comments CBGs average 130 at home, higher after meals   History of Present Illness: [name] [name] comes in today for   for follow up of multiple medical problems .  Since last visit  here  there have been no major changes in health status     has seen dr E for thyroid abnormality and   DM : ran out of medication  for about 2 weeks near  x mas . and some adheranc e.   No  low bg and no infections newer  better now.    130- 160    fasting   no recent  HT :  controlled Cough nagging  minor cough ? cause no cp or sob    on lisinopril   Preventive Screening-Counseling & Management  Alcohol-Tobacco     Alcohol drinks/day: 0     Smoking Status: never  Current Medications (verified): 1)  Lisinopril 10 Mg Tabs (Lisinopril) .Marland Kitchen.. 1 By Mouth Once Daily 2)  Freestyle Test   Strp (Glucose Blood) .... Use As Directed 3)  Freestyle Unistick Ii Lancets   Misc (Lancets) .... Use As Directed 4)  Amaryl 2 Mg  Tabs (Glimepiride) .Marland Kitchen.. 1 By Mouth Once Daily 5)  Actoplus Met 15-850 Mg  Tabs (Pioglitazone Hcl-Metformin Hcl) .Marland Kitchen.. 1 By Mouth Two Times A Day 6)  Drisdol 78295 Unit  Caps (Ergocalciferol) .Marland Kitchen.. 1 By Mouth Q Week Every Other Week 7)  Zocor 20 Mg Tabs (Simvastatin) .Marland Kitchen.. 1 By Mouth Once Daily 8)  Glycopyrrolate 2 Mg Tabs (Glycopyrrolate) .Marland Kitchen.. 1 By Mouth Two Times A Day Ibs 9)  Methocarbamol 500 Mg Tabs (Methocarbamol) 10)  Arthrotec 75 75-200 Mg-Mcg Tabs (Diclofenac-Misoprostol)  .... As Needed  Allergies: 1)  Naprosyn  Past History:  Past medical, surgical, family and social histories (including risk factors) reviewed, and no changes noted (except as noted below).  Past Medical History: Reviewed history from 10/09/2008 and no changes required. Allergic rhinitis Diabetes mellitus, type II Hyperlipidemia Hypertension myoview low risk study  1/08  IBS  Sciatica   Past Surgical History: Reviewed history from 03/22/2007 and no changes required. C-Sections x [phone]  Family History: Reviewed history from 12/01/2008 and no changes required. [place] Family History Lung cancer  MGF   MGM Family History Diabetes 1st degree relative father , PGM  Family History Hypertension   MOM died when pt was 85 of AML aunt with breast cancer age 71 no thyroid problem  Social History: Reviewed history from 07/12/2008 and no changes required. 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         sore heel pain.   hurts when standing.     right latealr rib are apain when moves a certain way at time.   Physical Exam  General:  Well-developed,well-nourished,in no acute distress; alert,appropriate and cooperative throughout examination Head:  normocephalic and atraumatic.   Eyes:  vision grossly intact and pupils equal.   Ears:  R ear normal, L ear normal, and no external deformities.   Neck:  No deformities, masses, or tenderness noted. Lungs:  Normal respiratory effort, chest expands symmetrically. Lungs are  clear to auscultation, no crackles or wheezes. Heart:  Normal rate and regular rhythm. S1 and S2 normal without gallop, murmur, click, rub or other extra sounds. Abdomen:  Bowel sounds positive,abdomen soft and non-tender without masses, organomegaly or  noted.  ? tender right lower rib cage when sitting  no abd pain Pulses:  pulses intact without delay   Neurologic:  nl gait   non focal  Skin:  turgor normal, color normal, no ecchymoses, no petechiae, and no purpura.   Cervical Nodes:  No lymphadenopathy noted Psych:  Oriented X3, good eye contact, not anxious appearing, and not depressed appearing.     Impression & Recommendations:  Problem # 1:  DIABETES MELLITUS, TYPE II (ICD-250.00) Assessment Deteriorated add med and encourage adherence  Her updated medication list for this problem includes:    Lisinopril 10 Mg Tabs (Lisinopril) .Marland Kitchen... 1 by mouth once daily    Amaryl 2 Mg Tabs (Glimepiride) .Marland Kitchen... 1 by mouth once daily    Actoplus Met 15-850 Mg Tabs (Pioglitazone hcl-metformin hcl) .Marland Kitchen... 1 by mouth two times a day    Losartan Potassium 50 Mg Tabs (Losartan potassium) .Marland Kitchen... 1 by mouth once daily for blood pressure    Onglyza 5 Mg Tabs (Saxagliptin hcl) .Marland Kitchen... 1 by mouth once daily  Labs Reviewed: Creat: 0.7 (03/27/2009)    Reviewed HgBA1c results: 8.0 (03/27/2009)  7.4 (09/05/2008)  Problem # 2:  COUGH (ICD-786.2)  ? from ace       change to cozaar   Problem # 3:  HYPERTENSION (ICD-401.9)  Her updated medication list for this problem includes:    Lisinopril 10 Mg Tabs (Lisinopril) .Marland Kitchen... 1 by mouth once daily    Losartan Potassium 50 Mg Tabs (Losartan potassium) .Marland Kitchen... 1 by mouth once daily for blood pressure  Problem # 4:  THYROID FUNCTION TEST, ABNORMAL (ICD-794.5) Assessment: Improved  Problem # 5:  UNSPECIFIED VITAMIN D DEFICIENCY (ICD-268.9) Assessment: Improved can change to otc med   Problem # 6:  HYPERLIPIDEMIA (ICD-272.4)  Her updated medication list for this  problem includes:    Zocor 20 Mg Tabs (Simvastatin) .Marland Kitchen... 1 by mouth once daily  Labs Reviewed: SGOT: 23 (03/27/2009)   SGPT: 26 (03/27/2009)  Lipid Goals: Chol Goal: 200 (03/22/2007)   HDL Goal: 40 (03/22/2007)   LDL Goal: 100 (03/22/2007)   TG Goal: 150 (03/22/2007)  Prior 10 Yr Risk Heart Disease: 11 % (04/28/2008)   HDL:57.0 (04/14/2008), 54.7 (12/10/2007)  LDL:95 (04/14/2008), 82 (12/10/2007)  Chol:184 (04/14/2008), 175 (12/10/2007)  Trig:159 (04/14/2008), 191 (12/10/2007)  Complete Medication List: 1)  Lisinopril 10 Mg Tabs (Lisinopril) .Marland Kitchen.. 1 by mouth once daily 2)  Freestyle Test Strp (Glucose blood) .... Use as directed 3)  Freestyle Unistick Ii Lancets Misc (Lancets) .... Use as directed 4)  Amaryl 2 Mg Tabs (Glimepiride) .Marland Kitchen.. 1 by mouth once daily 5)  Actoplus Met 15-850 Mg Tabs (Pioglitazone hcl-metformin hcl) .Marland Kitchen.. 1 by mouth two times a day 6)  Drisdol 21308 Unit Caps (Ergocalciferol) .Marland Kitchen.. 1 by mouth q week every other week 7)  Zocor 20 Mg Tabs (Simvastatin) .Marland Kitchen.. 1 by mouth once daily 8)  Glycopyrrolate 2 Mg Tabs (Glycopyrrolate) .Marland Kitchen.. 1 by mouth two times a day ibs 9)  Methocarbamol 500 Mg Tabs (Methocarbamol) 10)  Arthrotec 75 75-200 Mg-mcg Tabs (Diclofenac-misoprostol) .... As needed 11)  Losartan Potassium 50 Mg Tabs (Losartan potassium) .Marland Kitchen.. 1 by mouth once daily for blood pressure 12)  Onglyza 5 Mg Tabs (Saxagliptin hcl) .Marland Kitchen.. 1 by mouth once daily  Patient Instructions: 1)  i think your cough could be the Lisinopril  and will  change medication      2)  monitor your Bp readings int the meantime.  3)  ok to do otc vit d  now  4)  Decrease sugars and take your dm med  each day.   5)  Add  onglyza 1 by mouth once daily   6)  rov in 1-2 months and we will check your BP cough and BG readings.  Prescriptions: ONGLYZA 5 MG TABS (SAXAGLIPTIN HCL) 1 by mouth once daily  #30 x 3   Entered and Authorized by:   Madelin Headings MD   Signed by:   Madelin Headings MD on  04/03/2009   Method used:   Print then Give to Patient   RxID:   [id number] ZOCOR 20 MG TABS (SIMVASTATIN) 1 by mouth once daily  #30 x 6   Entered and Authorized by:   Madelin Headings MD   Signed by:   Madelin Headings MD on 04/03/2009   Method used:   Print then Give to Patient   RxID:   [id number] LOSARTAN POTASSIUM 50 MG TABS (LOSARTAN POTASSIUM) 1 by mouth once daily for blood pressure  #30 x 3   Entered and Authorized by:   Madelin Headings MD   Signed by:   Madelin Headings MD on 04/03/2009   Method used:   Print then Give to Patient   RxID:   [id number] ACTOPLUS MET 15-850 MG  TABS (PIOGLITAZONE HCL-METFORMIN HCL) 1 by mouth two times a day  #60 Each x 3   Entered and Authorized by:   Madelin Headings MD   Signed by:   Madelin Headings MD on 04/03/2009   Method used:   Electronically to        [facility]  [phone]* (retail)       [address]       [place], [place]  [address]       Ph: [phone] or [phone]       Fax: [phone]   RxID:   [id number] AMARYL 2 MG  TABS (GLIMEPIRIDE) 1 by mouth once daily  #90 x 3   Entered and Authorized by:   Madelin Headings MD   Signed by:   Madelin Headings MD on 04/03/2009   Method used:   Electronically to        [facility]  [phone]* (retail)       [address]       [place], [place]  [address]       Ph: [phone] or  [phone]       Fax: [phone]   RxID:   [id number]

## 2010-03-29 ENCOUNTER — Other Ambulatory Visit: Payer: Self-pay | Admitting: Internal Medicine

## 2010-06-28 ENCOUNTER — Other Ambulatory Visit: Payer: Self-pay | Admitting: Internal Medicine

## 2010-06-28 DIAGNOSIS — E119 Type 2 diabetes mellitus without complications: Secondary | ICD-10-CM

## 2010-06-28 DIAGNOSIS — I10 Essential (primary) hypertension: Secondary | ICD-10-CM

## 2010-06-28 DIAGNOSIS — E785 Hyperlipidemia, unspecified: Secondary | ICD-10-CM

## 2010-06-28 MED ORDER — SAXAGLIPTIN HCL 5 MG PO TABS: 5.0000 mg | ORAL_TABLET | Freq: Every day | ORAL | Status: DC

## 2010-06-28 MED ORDER — PIOGLITAZONE HCL-METFORMIN HCL 15-850 MG PO TABS: 1.0000 | ORAL_TABLET | Freq: Two times a day (BID) | ORAL | Status: DC

## 2010-06-28 NOTE — Telephone Encounter (Signed)
Refill of Onglyza and Actoplus to [facility] on [address].

## 2010-06-28 NOTE — Telephone Encounter (Signed)
Pt needs to have lipids, hga1c,lft's and bmp with a rov done before next refill. Letter sent to pt. Orders in epic.

## 2010-06-28 NOTE — Assessment & Plan Note (Signed)
[facility]                           GASTROENTEROLOGY OFFICE NOTE   NAME:[name], [name]                     MRN:          [id number]  DATE:10/21/2005                            DOB:          01/09/1951    [name] [name] reports resolution of her symptoms following a course of  Xifaxan.  She has very minimal diarrhea about once a week.  Her colonoscopy  was unremarkable, and the biopsies were negative.  She did not fill her  Robinul, as her symptoms have been so minimal.   CURRENT MEDICATIONS:  Listed on the chart, updated and reviewed.   MEDICATION ALLERGIES:  None known.   PHYSICAL EXAMINATION:  In no acute distress.  Weight 178.8.  Blood pressure  is 100/64, pulse 64 and regular.  She is not re-examined.   ASSESSMENT AND PLAN:  Presumed irritable bowel syndrome.  Substantial  symptomatic improvement following a course of Xifaxan.  Return office visit  p.r.n.  May use Robinul Forte 1 b.i.d. p.r.n. if her symptoms return.                                   Venita Lick. Russella Dar, MD, [facility]   MTS/MedQ  DD:  10/22/2005  DT:  10/23/2005  Job #:  [id number]   cc:   Hal Morales, M.D.

## 2010-07-23 ENCOUNTER — Encounter: Payer: Self-pay | Admitting: Internal Medicine

## 2010-07-31 ENCOUNTER — Other Ambulatory Visit (INDEPENDENT_AMBULATORY_CARE_PROVIDER_SITE_OTHER): Payer: 59

## 2010-07-31 DIAGNOSIS — E119 Type 2 diabetes mellitus without complications: Secondary | ICD-10-CM

## 2010-07-31 DIAGNOSIS — E785 Hyperlipidemia, unspecified: Secondary | ICD-10-CM

## 2010-07-31 DIAGNOSIS — I10 Essential (primary) hypertension: Secondary | ICD-10-CM

## 2010-07-31 LAB — HEPATIC FUNCTION PANEL
ALT: 26 U/L (ref 0–35)
Albumin: 3.7 g/dL (ref 3.5–5.2)
Alkaline Phosphatase: 81 U/L (ref 39–117)
Total Protein: 6.1 g/dL (ref 6.0–8.3)

## 2010-07-31 LAB — BASIC METABOLIC PANEL
CO2: 25 mEq/L (ref 19–32)
Calcium: 8.8 mg/dL (ref 8.4–10.5)
Chloride: 106 mEq/L (ref 96–112)
Creatinine, Ser: 0.6 mg/dL (ref 0.4–1.2)
Glucose, Bld: 165 mg/dL — ABNORMAL HIGH (ref 70–99)

## 2010-07-31 LAB — LIPID PANEL
HDL: 55.1 mg/dL (ref >39.00)
Triglycerides: 150 mg/dL — ABNORMAL HIGH (ref 0.0–149.0)

## 2010-07-31 LAB — HEMOGLOBIN A1C: Hgb A1c MFr Bld: 8.6 % — ABNORMAL HIGH (ref 4.6–6.5)

## 2010-08-05 ENCOUNTER — Encounter: Payer: Self-pay | Admitting: Internal Medicine

## 2010-08-05 ENCOUNTER — Ambulatory Visit (INDEPENDENT_AMBULATORY_CARE_PROVIDER_SITE_OTHER): Payer: 59 | Admitting: Internal Medicine

## 2010-08-05 VITALS — BP 140/90 | HR 126 | Temp 98.6°F | Wt 197.0 lb

## 2010-08-05 DIAGNOSIS — E119 Type 2 diabetes mellitus without complications: Secondary | ICD-10-CM

## 2010-08-05 DIAGNOSIS — R05 Cough: Secondary | ICD-10-CM

## 2010-08-05 DIAGNOSIS — I10 Essential (primary) hypertension: Secondary | ICD-10-CM

## 2010-08-05 DIAGNOSIS — R059 Cough, unspecified: Secondary | ICD-10-CM

## 2010-08-05 DIAGNOSIS — R509 Fever, unspecified: Secondary | ICD-10-CM

## 2010-08-05 MED ORDER — AZITHROMYCIN 250 MG PO TABS: 250.0000 mg | ORAL_TABLET | ORAL | Status: AC

## 2010-08-05 NOTE — Patient Instructions (Signed)
Take the antibiotic in case this is early pneumonia Get chest x ray tomorrow and will be in touch about results.  Your diabetes is not optimally controlled  .Marland Kitchen We can consider changing other meds  Such as  Injectable  victoza or   Insulin...but would wait until  Getting over this illness.

## 2010-08-05 NOTE — Progress Notes (Signed)
  Subjective:    Patient ID: [name] [name], female    DOB: 12/08/1950, 60 y.o.   MRN: [id number]  HPI Visit scheduled as fu diabetes for sda but has acute illness for the last almost week Fever for 3 days or more  About 101 and over.  And cough bad . Taking  ?  Chest congestion.   No sob but very tired.   Grand son had  cough  And had c x ray that was negative.  No vomiting iarrhea  No rashes.  Tried ibuprofen and cough med  Delsym and robitussin.    WU:JWJX bg since  Sick . No vision change skin changes rashes  Swelling. No lows since last visit in Dec 2011. Was supposed to return 3 months ago for fu of disease management. BP has been ok  On meds on arb,  Because of cough issue in the past. LIPIDS:  On simva  Review of Systems Fever nor back pain  NVD see above  No gu sx and no tick bites   Past history family history social history reviewed in the electronic medical record.     Objective:   Physical Exam wdwn in nad  But deep cough   Non toxic looks tired nl color HEENT: Normocephalic ;atraumatic , Eyes;  PERRL, EOMs  Full, lids and conjunctiva clear,,Ears: no deformities, canals nl, TM landmarks normal, Nose: no deformity or discharge but congested  Somewhat hoarse   Mouth : OP clear without lesion or edema . Chest:  ?Clear to A&P  Slight  Crackle at bases?  without wheezes rales or rhonchi CV:  S1-S2 no gallops or murmurs peripheral perfusion is normal Neuro non focal grossly . Skin no acute rashes orpetechiae      Assessment & Plan:  Cough fever concern about early pneumonia because of length of fever and severity of sx.  Will get c xray and add antibiotic at this point. Sx rx and close follow up as appropriate   DM not in control  Counseled. But weill address at follow up .  Has had compliance issue in the past HT slightly up today will follow .

## 2010-08-06 ENCOUNTER — Ambulatory Visit (INDEPENDENT_AMBULATORY_CARE_PROVIDER_SITE_OTHER)
Admission: RE | Admit: 2010-08-06 | Discharge: 2010-08-06 | Disposition: A | Discharge status: Home / Self Care | Payer: 59 | Source: Ambulatory Visit | Attending: Internal Medicine | Admitting: Internal Medicine

## 2010-08-06 ENCOUNTER — Ambulatory Visit: Payer: Self-pay | Admitting: Internal Medicine

## 2010-08-06 ENCOUNTER — Telehealth: Payer: Self-pay | Admitting: *Deleted

## 2010-08-06 DIAGNOSIS — R509 Fever, unspecified: Secondary | ICD-10-CM

## 2010-08-06 DIAGNOSIS — R059 Cough, unspecified: Secondary | ICD-10-CM

## 2010-08-06 DIAGNOSIS — R05 Cough: Secondary | ICD-10-CM

## 2010-08-06 NOTE — Telephone Encounter (Signed)
Pt aware of this. 

## 2010-08-06 NOTE — Telephone Encounter (Signed)
Pt would like chest xray results

## 2010-08-11 ENCOUNTER — Encounter: Payer: Self-pay | Admitting: Internal Medicine

## 2010-08-11 DIAGNOSIS — R509 Fever, unspecified: Secondary | ICD-10-CM | POA: Insufficient documentation

## 2010-08-19 ENCOUNTER — Encounter: Payer: Self-pay | Admitting: Internal Medicine

## 2010-08-19 ENCOUNTER — Ambulatory Visit (INDEPENDENT_AMBULATORY_CARE_PROVIDER_SITE_OTHER): Payer: 59 | Admitting: Internal Medicine

## 2010-08-19 DIAGNOSIS — I10 Essential (primary) hypertension: Secondary | ICD-10-CM

## 2010-08-19 DIAGNOSIS — E785 Hyperlipidemia, unspecified: Secondary | ICD-10-CM

## 2010-08-19 DIAGNOSIS — E119 Type 2 diabetes mellitus without complications: Secondary | ICD-10-CM

## 2010-08-19 MED ORDER — SAXAGLIPTIN HCL 5 MG PO TABS: 5.0000 mg | ORAL_TABLET | Freq: Every day | ORAL | Status: DC

## 2010-08-19 MED ORDER — GLIMEPIRIDE 2 MG PO TABS: ORAL_TABLET | ORAL | Status: DC

## 2010-08-19 MED ORDER — LOSARTAN POTASSIUM 50 MG PO TABS: 50.0000 mg | ORAL_TABLET | Freq: Every day | ORAL | Status: DC

## 2010-08-19 MED ORDER — PIOGLITAZONE HCL-METFORMIN HCL 15-850 MG PO TABS: 1.0000 | ORAL_TABLET | Freq: Two times a day (BID) | ORAL | Status: DC

## 2010-08-19 NOTE — Patient Instructions (Addendum)
Consider victoza    Instead of onglyza  .  Will do nutrition referral Take the glimeprimide   Before breakfast .  If readings are up we can increase to 1.5  Pills  or 3 mg in the am . The actomet plus before/with meals. The simvastatin at night.    ROV in 3 months with HG a1c or  Earlier if needed

## 2010-08-19 NOTE — Progress Notes (Signed)
  Subjective:    Patient ID: [name] [name], female    DOB: 09/04/1950, 60 y.o.   MRN: [id number]  HPI Patient comes in for fu of illness and  multiple medical issues  Better from prev illness  Had  sob when ran up stairs.  Fever resolved in a day or so and now no sob but has  Minor cough now.  DM: same meds no lows  No change in vision . No infections except above . Needs refill meds  Not taling amaryl .   HT taking meds controlled LIPIDS: no se of meds noted    Review of Systems Neg cp sob fever bleeding vision change  New GI GU issues.   Past history family history social history reviewed in the electronic medical record.     Objective:   Physical Exam WDWN in nad Chest:  Clear to A&P without wheezes rales or rhonchi CV:  S1-S2 no gallops or murmurs peripheral perfusion is normal pulse is 86and reg  No clubbing cyanosis or edema Labs reviewed. Lab Results  Component Value Date   HGBA1C 8.6* 07/31/2010   HGBA1C 8.3* 01/14/2010   HGBA1C 7.4* 09/03/2009   Lab Results  Component Value Date   MICROALBUR 2.6* 09/03/2009   LDLCALC 103* 07/31/2010   CREATININE 0.6 07/31/2010         Assessment & Plan:  Dm Not in control  But uncomplicated    Has been out of onglyza for a while  But still out of control   Disc options  changing to victoza  Or consider insulin or the amaryl  But because of not on meds correctly   Would like to review eating habits first .  Refer to nutrition.   HT continue LIPIDS continue RTI better Total visit > 50% spent counseling and coordinating care

## 2010-08-25 ENCOUNTER — Encounter: Payer: Self-pay | Admitting: Internal Medicine

## 2010-09-12 ENCOUNTER — Ambulatory Visit (INDEPENDENT_AMBULATORY_CARE_PROVIDER_SITE_OTHER): Payer: 59 | Admitting: Internal Medicine

## 2010-09-12 ENCOUNTER — Encounter: Payer: Self-pay | Admitting: Internal Medicine

## 2010-09-12 VITALS — BP 120/80 | HR 60 | Temp 98.2°F | Wt 200.0 lb

## 2010-09-12 DIAGNOSIS — E119 Type 2 diabetes mellitus without complications: Secondary | ICD-10-CM

## 2010-09-12 DIAGNOSIS — R05 Cough: Secondary | ICD-10-CM

## 2010-09-12 DIAGNOSIS — L247 Irritant contact dermatitis due to plants, except food: Secondary | ICD-10-CM

## 2010-09-12 DIAGNOSIS — K589 Irritable bowel syndrome without diarrhea: Secondary | ICD-10-CM

## 2010-09-12 DIAGNOSIS — L255 Unspecified contact dermatitis due to plants, except food: Secondary | ICD-10-CM

## 2010-09-12 DIAGNOSIS — R059 Cough, unspecified: Secondary | ICD-10-CM

## 2010-09-12 MED ORDER — FLUOCINONIDE 0.05 % EX CREA: TOPICAL_CREAM | CUTANEOUS | Status: DC

## 2010-09-12 MED ORDER — GLYCOPYRROLATE 2 MG PO TABS: 2.0000 mg | ORAL_TABLET | Freq: Two times a day (BID) | ORAL | Status: DC

## 2010-09-12 MED ORDER — PREDNISONE 10 MG PO TABS: ORAL_TABLET | ORAL | Status: DC

## 2010-09-12 NOTE — Patient Instructions (Signed)
Take prednisone for plant contact dermatitis. Try to not scratch Call if signs of infection  Fever. Secondary infection.   Prednisone can increase your blood sugar.  So monitor in the meantime . Call if 250 and over for advice. Will check on nutrition referfal status

## 2010-09-12 NOTE — Progress Notes (Signed)
  Subjective:    Patient ID: [name] [name], female    DOB: 1950-11-25, 60 y.o.   MRN: [id number]  HPI Comes in today with very itchy rash onset after working in garden  4 days ago. Began on arms and now spreading rash to face both sides .  No eye pain but some left eye irritation.  No fever pain pustules .   Using 1 hcs but not that helpful.  Still has cough but better no sob. Waiting on nutrition referral for dm. No change see lat visit.  Want refill of IBS  meds doing ok  Given in past per GI.  Review of Systems Neg cp sob swelling or edema  Pain or numbness vision change as per HPI  Past history family history social history reviewed in the electronic medical record.     Objective:   Physical Exam WDWN in nad with rash on face and arms  HEENT: Normocephalic ;atraumatic , Eyes;  PERRL, EOMs  Full, lids and conjunctiva clear,,Ears: no deformities, canals nl, TM landmarks normal, Nose: no deformity or discharge  Mouth : OP clear without lesion or edema. Neck no masses Skin linear pink rash under both eyes not on  area spared by  glasses   Also patches of red area on forearms neck and hands  Rest of trunk is clear.       Assessment & Plan:  Contact dermatitis including face  Presumed plant contact Diabetes   Disc  Se of prednisone  And   onitor BG   Will check on nutrition consults Cough:  Mild pos infectious   ?  Now off the ACE   Will see how does after  pred rx .  IBS  Refill med for her.

## 2010-11-15 ENCOUNTER — Other Ambulatory Visit: Payer: Self-pay | Admitting: Internal Medicine

## 2010-11-22 ENCOUNTER — Other Ambulatory Visit: Payer: 59

## 2010-11-29 ENCOUNTER — Ambulatory Visit: Payer: 59 | Admitting: Internal Medicine

## 2010-12-13 ENCOUNTER — Ambulatory Visit: Payer: 59 | Admitting: Internal Medicine

## 2010-12-27 ENCOUNTER — Other Ambulatory Visit: Payer: 59

## 2011-01-01 ENCOUNTER — Other Ambulatory Visit (INDEPENDENT_AMBULATORY_CARE_PROVIDER_SITE_OTHER): Payer: 59

## 2011-01-01 DIAGNOSIS — E119 Type 2 diabetes mellitus without complications: Secondary | ICD-10-CM

## 2011-01-01 LAB — HEMOGLOBIN A1C: Hgb A1c MFr Bld: 7.7 % — ABNORMAL HIGH (ref 4.6–6.5)

## 2011-01-01 LAB — BASIC METABOLIC PANEL
CO2: 24 mEq/L (ref 19–32)
Chloride: 105 mEq/L (ref 96–112)
Sodium: 139 mEq/L (ref 135–145)

## 2011-01-10 ENCOUNTER — Encounter: Payer: Self-pay | Admitting: Internal Medicine

## 2011-01-10 ENCOUNTER — Ambulatory Visit (INDEPENDENT_AMBULATORY_CARE_PROVIDER_SITE_OTHER): Payer: 59 | Admitting: Internal Medicine

## 2011-01-10 VITALS — BP 100/60 | HR 66 | Wt 211.0 lb

## 2011-01-10 DIAGNOSIS — E119 Type 2 diabetes mellitus without complications: Secondary | ICD-10-CM

## 2011-01-10 DIAGNOSIS — N644 Mastodynia: Secondary | ICD-10-CM | POA: Insufficient documentation

## 2011-01-10 DIAGNOSIS — Z23 Encounter for immunization: Secondary | ICD-10-CM

## 2011-01-10 DIAGNOSIS — I10 Essential (primary) hypertension: Secondary | ICD-10-CM

## 2011-01-10 DIAGNOSIS — E785 Hyperlipidemia, unspecified: Secondary | ICD-10-CM

## 2011-01-10 NOTE — Patient Instructions (Addendum)
Will arrange a diagnostic mammo gram .  fo right breast tenderness   And have you call about the diabetes nutrition referral.  i the meantime Intensify lifestyle interventions.  Otherwise recheck in 3 months for diabetes.

## 2011-01-10 NOTE — Progress Notes (Signed)
Subjective:    Patient ID: [name] [name], female    DOB: [birth year], 60 y.o.   MRN: [id number]  HPI Patient comes in today for follow up of  multiple medical problems.    DM not checking very often   Never heard about nutriton consults no se of meds  No hx of lows or infections   New problem:Sore  Breast   Right   Due for mammo called [facility] and told to see pcp for any further orders.  Has tenderness and aching right breast without dc for the past months no skin changes usnsure if important. Is concerned. HT  Stable LIPIDS: nose of meds    Review of Systems No cp sob edema falling  Bleeding rest as above.  Past Medical History  Diagnosis Date  . Allergic rhinitis   . Diabetes mellitus   . Hyperlipidemia   . Hypertension   . IBS (irritable bowel syndrome)   . Sciatica   . Normal nuclear stress test 1 /08    low risk study     History   Social History  . Marital Status: Legally Separated    Spouse Name: N/A    Number of Children: N/A  . Years of Education: N/A   Occupational History  . Not on file.   Social History Main Topics  . Smoking status: Never Smoker   . Smokeless tobacco: Not on file  . Alcohol Use: No  . Drug Use: No  . Sexually Active:    Other Topics Concern  . 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 Cesarean section     x 3; 08,65,78  . Nm myoview ltd 02/2006    low risk study    Family History  Problem Relation Age of Onset  . Acute myelogenous leukemia Mother     died when pat age 78   . Diabetes Father     Allergies  Allergen Reactions  . Naproxen     REACTION: Dizziness and nausea, slowed down pt's reaction time    Current Outpatient Prescriptions on File Prior to Visit  Medication Sig Dispense Refill  . FreeStyle Unistick II Lancets MISC by Does not apply route.        Marland Kitchen  glimepiride (AMARYL) 2 MG tablet take 1 to  1.5 tablets  Before breakfast or as directed  45 tablet  6  . Glucose Blood (FREESTYLE TEST VI) by In Vitro route.        Marland Kitchen glycopyrrolate (ROBINUL) 2 MG tablet Take 1 tablet (2 mg total) by mouth 2 (two) times daily.  60 tablet  1  . losartan (COZAAR) 50 MG tablet Take 1 tablet (50 mg total) by mouth daily.  30 tablet  3  . MULTIPLE VITAMIN PO Take by mouth.        . pioglitazone-metformin (ACTOPLUS MET) 15-850 MG per tablet Take 1 tablet by mouth 2 (two) times daily with a meal.  60 tablet  3  . saxagliptin HCl (ONGLYZA) 5 MG TABS tablet Take 1 tablet (5 mg total) by mouth daily.  30 tablet  3  . simvastatin (ZOCOR) 20 MG tablet TAKE ONE TABLET BY MOUTH EVERY DAY  30 tablet  0    BP 100/60  Pulse 66  Wt 211 lb (95.709 kg)  Objective:   Physical Exam WDWN in and Chest:  Clear to A&P without wheezes rales or rhonchi CV:  S1-S2 no gallops or murmurs peripheral perfusion is normal Neck: Supple without adenopathy or masses or bruits Chest:  Clear to A&P without wheezes rales or rhonchi CV:  S1-S2 no gallops or murmurs peripheral perfusion is normal Breast: normal by inspection . No dimpling, discharge, or discharge .   Co tenderness ruo q Small less than pea sized obile ? Cyst at that area  Axilla is clear  Left breast normal Abdomen:  Sof,t normal bowel sounds without hepatosplenomegaly, no guarding rebound or masses no CVA tenderness   Lab Results  Component Value Date   WBC 6.9 02/23/2007   HGB 14.6 02/23/2007   HCT 41.0 02/23/2007   PLT 350 02/23/2007   GLUCOSE 164* 01/01/2011   CHOL 188 07/31/2010   TRIG 150.0* 07/31/2010   HDL 55.10 07/31/2010   LDLDIRECT 132.6 01/14/2010   LDLCALC 103* 07/31/2010   ALT 26 07/31/2010   AST 28 07/31/2010   NA 139 01/01/2011   K 3.5 01/01/2011   CL 105 01/01/2011   CREATININE 0.7 01/01/2011   BUN 14 01/01/2011   CO2 24 01/01/2011   TSH 0.92 03/27/2009   HGBA1C 7.7* 01/01/2011   MICROALBUR 2.6*  09/03/2009        Assessment & Plan:  DM    Better but need more help not at best goal  reviewed ehr and referral for nutrition done but  She never answered the phone and no VM noted. I suppose they didn't send  Mail or call her at work.   Have her call on her own. Consider change to GLP1 agent.   She wasn't to Intensify lifestyle interventions. First .  Breast tenderness right ? Sig of  Poss cyst tiny in the area of tenderness  Refer for diagnostic  mammo evaluation  HT stable LIPid no change

## 2011-01-12 NOTE — Assessment & Plan Note (Signed)
Ok to refer for diagnostic as due for mammo also.  Pt doesn't have phone message ability  Set up so can miss out on appts . She advised call work number  Or mail.

## 2011-01-13 ENCOUNTER — Other Ambulatory Visit: Payer: Self-pay | Admitting: Internal Medicine

## 2011-01-13 ENCOUNTER — Telehealth: Payer: Self-pay

## 2011-01-13 DIAGNOSIS — IMO0002 Reserved for concepts with insufficient information to code with codable children: Secondary | ICD-10-CM

## 2011-01-13 NOTE — Telephone Encounter (Signed)
Order changed.

## 2011-01-13 NOTE — Telephone Encounter (Signed)
Per Remus [facility] from [facility] pt's orders in Epic need to be changed to Diagnositc Bilateral.

## 2011-01-28 ENCOUNTER — Ambulatory Visit
Admission: RE | Admit: 2011-01-28 | Discharge: 2011-01-28 | Disposition: A | Discharge status: Home / Self Care | Payer: 59 | Source: Ambulatory Visit | Attending: Internal Medicine | Admitting: Internal Medicine

## 2011-01-28 ENCOUNTER — Other Ambulatory Visit: Payer: Self-pay | Admitting: Internal Medicine

## 2011-01-28 DIAGNOSIS — IMO0002 Reserved for concepts with insufficient information to code with codable children: Secondary | ICD-10-CM

## 2011-01-28 DIAGNOSIS — N6310 Unspecified lump in the right breast, unspecified quadrant: Secondary | ICD-10-CM

## 2011-02-10 ENCOUNTER — Ambulatory Visit
Admission: RE | Admit: 2011-02-10 | Discharge: 2011-02-10 | Disposition: A | Discharge status: Home / Self Care | Payer: 59 | Source: Ambulatory Visit | Attending: Internal Medicine | Admitting: Internal Medicine

## 2011-02-10 DIAGNOSIS — N6310 Unspecified lump in the right breast, unspecified quadrant: Secondary | ICD-10-CM

## 2011-02-27 ENCOUNTER — Ambulatory Visit: Payer: 59 | Admitting: *Deleted

## 2011-02-27 ENCOUNTER — Telehealth: Payer: Self-pay | Admitting: *Deleted

## 2011-02-27 DIAGNOSIS — E119 Type 2 diabetes mellitus without complications: Secondary | ICD-10-CM

## 2011-02-27 DIAGNOSIS — I10 Essential (primary) hypertension: Secondary | ICD-10-CM

## 2011-02-27 DIAGNOSIS — E785 Hyperlipidemia, unspecified: Secondary | ICD-10-CM

## 2011-02-27 MED ORDER — SIMVASTATIN 20 MG PO TABS: 20.0000 mg | ORAL_TABLET | Freq: Every day | ORAL | Status: DC

## 2011-02-27 MED ORDER — PIOGLITAZONE HCL-METFORMIN HCL 15-850 MG PO TABS: 1.0000 | ORAL_TABLET | Freq: Two times a day (BID) | ORAL | Status: DC

## 2011-02-27 NOTE — Telephone Encounter (Signed)
Refill on actosplus and zocor

## 2011-03-05 ENCOUNTER — Encounter: Payer: Self-pay | Admitting: Internal Medicine

## 2011-03-05 ENCOUNTER — Ambulatory Visit (INDEPENDENT_AMBULATORY_CARE_PROVIDER_SITE_OTHER): Payer: 59 | Admitting: Internal Medicine

## 2011-03-05 VITALS — BP 140/90 | HR 104 | Temp 99.0°F | Wt 207.0 lb

## 2011-03-05 DIAGNOSIS — I10 Essential (primary) hypertension: Secondary | ICD-10-CM

## 2011-03-05 DIAGNOSIS — E119 Type 2 diabetes mellitus without complications: Secondary | ICD-10-CM

## 2011-03-05 DIAGNOSIS — J029 Acute pharyngitis, unspecified: Secondary | ICD-10-CM | POA: Insufficient documentation

## 2011-03-05 DIAGNOSIS — J069 Acute upper respiratory infection, unspecified: Secondary | ICD-10-CM | POA: Insufficient documentation

## 2011-03-05 NOTE — Progress Notes (Signed)
  Subjective:    Patient ID: [name] [name], female    DOB: 18-Jun-1950, 61 y.o.   MRN: [id number]  HPI  Patient comes in today for SDA  For acute problem evaluation. For acute resp illness.  Out of work  For a week. Onset with bad Sore throat   And head congestion and cough .    gs with fever.   Eyes watery  No pus. Cough is minimal using robitussin no cp sob wheeze and no fever chills just tired and throat hurts . No face pain like sinus and slight drainage.  Concerned about exposing others at work  And length of illness  Review of Systems NEG fever cp sob  dibetes bp changes no bleeding NV   Hx of recurrent upper throat air burps  No pain not related to above. No hiccoughs Past history family history social history reviewed in the electronic medical record.     Objective:   Physical Exam WDWN in NAD  quiet respirations; mildly congested  somewhat hoarse. Non toxic . HEENT: Normocephalic ;atraumatic , Eyes;  PERRL, EOMs  Full, lids and conjunctiva mildly red  No discharge ,,Ears: no deformities, canals nl, TM landmarks normal, Nose: no deformity or discharge but congested;face non tender Mouth : OP clear without lesion or edema .  2+ red no ulcers  Good airway Neck: Supple without adenopathy or masses or bruits somwhat tender ac area  Chest:  Clear to A&P without wheezes rales or rhonchi CV:  S1-S2 no gallops or murmurs peripheral perfusion is normal Skin :nl perfusion and no acute rashes   Left wrist with ganglion non tender .      Assessment & Plan:  Acute resp infection prob viral poss adeno  \Counseled. About alarm features and if    persistent or progressive to call for  reeval etc.  Work when improved  Nl hygiene to avoid  Infecting others.  Ht ;DM  No changes

## 2011-03-05 NOTE — Patient Instructions (Signed)
This is probably a viral respiratory infection . Adenovirus and other viruses can cause this .  Antibiotics dont help a viral infection Should improve in the next week cough can last 2-3 week. Call if fever  Shortness o breath  Sinus pains that are unrelenting or other concerns.  Gargles  tylenol etc for  Sx .

## 2011-03-11 ENCOUNTER — Ambulatory Visit: Payer: 59 | Admitting: *Deleted

## 2011-03-27 ENCOUNTER — Encounter: Payer: 59 | Attending: Internal Medicine | Admitting: *Deleted

## 2011-03-27 ENCOUNTER — Encounter: Payer: Self-pay | Admitting: *Deleted

## 2011-03-27 DIAGNOSIS — E785 Hyperlipidemia, unspecified: Secondary | ICD-10-CM | POA: Insufficient documentation

## 2011-03-27 DIAGNOSIS — I10 Essential (primary) hypertension: Secondary | ICD-10-CM | POA: Insufficient documentation

## 2011-03-27 DIAGNOSIS — Z713 Dietary counseling and surveillance: Secondary | ICD-10-CM | POA: Insufficient documentation

## 2011-03-27 DIAGNOSIS — E119 Type 2 diabetes mellitus without complications: Secondary | ICD-10-CM | POA: Insufficient documentation

## 2011-03-27 NOTE — Progress Notes (Signed)
Medical Nutrition Therapy:  Appt start time: 1600 end time:  1700.  Assessment:  Primary concerns today: T2DM, Hyperlipidemia, HTN.  Pt here with A1c of 7.7 % for assessment of T2DM, lipids, and HTN.  Works sedentary job (data entry) and has son/grandson living with her.  Very tired when home from work; has trouble finding time to exercise.  Eats out (fast food) most meals and reports drinking only diet soda/water. Has a "horrible sweet tooth" and often snacks on chocolate during the day and ice cream at night.  Encouraged to start walking, even if just 1 day on weekend to start.  Pt also not checking BG as instructed by MD.  Last BG was over a month ago and pt states typically ranges from 120-200+ mg.  Average A1c of ~8% over last 3 years.  No pain reported at this time.  Lab Results  Component Value Date   HGBA1C 7.7* 01/01/2011   MEDICATIONS: See medication list; reconciled with pt.   DIETARY INTAKE:  Usual eating pattern includes 3 meals and 1-2 snacks per day.  24-hr recall:  B ( AM): [employer] pancakes w/ syrup; 1 pat butter; water or diet coke or nothing  Snk ( AM): Roasted nuts; usually nothing  L ( PM): [place] - 6" steak and cheese sub; 1/2 a small FF, ketchup; diet coke Snk ( PM): None D ( PM): Cooks what grandson likes; Lasagna, spaghetti, steak, or meatloaf - OR - Fast Food Snk ( PM): None (if any, popcorn or ice cream)  Usual physical activity: None   Estimated energy needs: (Note: starting at maintenance per pt request - will reassess at f/u) 1500 calories 165-170 g carbohydrates 110-115 g protein 40-42 g fat  Progress Towards Goal(s):  In progress.   Nutritional Diagnosis:  Snyder-2.1 Impaired nutrient utilization related to glucose metabolism as evidenced by A1c of 7.7% and patient reported excessive CHO intake.    Intervention/Goals:  Follow Diabetes Meal Plan as instructed (see yellow card).  Eat 3 meals and 2 snacks, or every 3-5 hrs.  Add lean protein foods  to all meals/snacks.  Monitor glucose levels as instructed by your doctor.  Check at least fasting blood sugar every morning.  Aim for 2 times/day - second time 2 hours after first bite of meal.  Aim for 20-30 mins of physical activity 2 days a week.  Bring glucose log to your next nutrition visit.  Handouts given during visit include:  Living Well with Diabetes  Snack List/45 g CHO menu  Fiber Foods List  HTN/Chol handouts  Monitoring/Evaluation:  Dietary intake, exercise, A1c, BG trends, and body weight in 4 week(s).

## 2011-03-27 NOTE — Patient Instructions (Addendum)
Goals:  Follow Diabetes Meal Plan as instructed (see yellow card).  Eat 3 meals and 2 snacks, or every 3-5 hrs.  Add lean protein foods to all meals/snacks.  Monitor glucose levels as instructed by your doctor.  Check at least fasting blood sugar every morning.  Aim for 2 times/day - second time 2 hours after first bite of meal.  Aim for 20-30 mins of physical activity 2 days a week.  Bring glucose log to your next nutrition visit.

## 2011-04-23 ENCOUNTER — Other Ambulatory Visit: Payer: Self-pay | Admitting: Internal Medicine

## 2011-04-24 ENCOUNTER — Ambulatory Visit: Payer: 59 | Admitting: *Deleted

## 2011-04-29 ENCOUNTER — Other Ambulatory Visit: Payer: 59

## 2011-05-06 ENCOUNTER — Ambulatory Visit: Payer: 59 | Admitting: Internal Medicine

## 2011-05-06 ENCOUNTER — Other Ambulatory Visit (INDEPENDENT_AMBULATORY_CARE_PROVIDER_SITE_OTHER): Payer: 59

## 2011-05-06 DIAGNOSIS — IMO0001 Reserved for inherently not codable concepts without codable children: Secondary | ICD-10-CM

## 2011-05-13 ENCOUNTER — Ambulatory Visit: Payer: 59 | Admitting: Internal Medicine

## 2011-05-20 ENCOUNTER — Ambulatory Visit: Payer: 59 | Admitting: Internal Medicine

## 2011-05-28 ENCOUNTER — Ambulatory Visit: Payer: Self-pay | Admitting: Obstetrics and Gynecology

## 2011-05-28 ENCOUNTER — Ambulatory Visit: Payer: 59 | Admitting: *Deleted

## 2011-05-30 ENCOUNTER — Encounter: Payer: Self-pay | Admitting: Internal Medicine

## 2011-05-30 ENCOUNTER — Ambulatory Visit (INDEPENDENT_AMBULATORY_CARE_PROVIDER_SITE_OTHER): Payer: 59 | Admitting: Internal Medicine

## 2011-05-30 VITALS — BP 140/90 | HR 98 | Temp 98.5°F | Wt 206.0 lb

## 2011-05-30 DIAGNOSIS — E119 Type 2 diabetes mellitus without complications: Secondary | ICD-10-CM

## 2011-05-30 DIAGNOSIS — E785 Hyperlipidemia, unspecified: Secondary | ICD-10-CM

## 2011-05-30 DIAGNOSIS — E669 Obesity, unspecified: Secondary | ICD-10-CM

## 2011-05-30 DIAGNOSIS — F439 Reaction to severe stress, unspecified: Secondary | ICD-10-CM | POA: Insufficient documentation

## 2011-05-30 DIAGNOSIS — Z639 Problem related to primary support group, unspecified: Secondary | ICD-10-CM

## 2011-05-30 DIAGNOSIS — E1165 Type 2 diabetes mellitus with hyperglycemia: Secondary | ICD-10-CM

## 2011-05-30 DIAGNOSIS — Z91120 Patient's intentional underdosing of medication regimen due to financial hardship: Secondary | ICD-10-CM | POA: Insufficient documentation

## 2011-05-30 DIAGNOSIS — Z9119 Patient's noncompliance with other medical treatment and regimen: Secondary | ICD-10-CM

## 2011-05-30 DIAGNOSIS — Y639 Failure in dosage during unspecified surgical and medical care: Secondary | ICD-10-CM

## 2011-05-30 DIAGNOSIS — I10 Essential (primary) hypertension: Secondary | ICD-10-CM

## 2011-05-30 DIAGNOSIS — IMO0001 Reserved for inherently not codable concepts without codable children: Secondary | ICD-10-CM

## 2011-05-30 MED ORDER — GLIMEPIRIDE 2 MG PO TABS: ORAL_TABLET | ORAL | Status: DC

## 2011-05-30 NOTE — Progress Notes (Signed)
Subjective:    Patient ID: [name] [name], female    DOB: [birth year], 61 y.o.   MRN: [id number]  HPI Patient comes in today for followup of multiple medical management.  Diabetes :  Is aware her sugars are up Ran out of most meds for a month or so   financial reasons couldn't pay for all of them and then b day cakes or other activities.   feeling ok except for stress.    childrens  issues at home and also had to put her Dog to sleep.  When she does have them she misses the Amaryl a good bit of the time forgets.  Did see nutritionist   hasnt time to implemented yet. Has a followup appointment [employer].   No major change in her vision hearing chest pain shortness of breath syncope. Cold sore.   Dentist gave her a cream and pills to take at the onset of her next cold sore Review of Systems Negative fever chest pain weight loss syncope.  Past history family history social history reviewed in the electronic medical record. Outpatient Encounter Prescriptions as of 05/30/2011  Medication Sig Dispense Refill  . FreeStyle Unistick II Lancets MISC by Does not apply route.        Marland Kitchen glimepiride (AMARYL) 2 MG tablet take  1.5 tablets  Before breakfast or as directed  135 tablet  2  . Glucose Blood (FREESTYLE TEST VI) by In Vitro route.        Marland Kitchen glycopyrrolate (ROBINUL) 2 MG tablet Take 1 tablet (2 mg total) by mouth 2 (two) times daily.  60 tablet  1  . losartan (COZAAR) 50 MG tablet Take 1 tablet (50 mg total) by mouth daily.  30 tablet  3  . MULTIPLE VITAMIN PO Take by mouth.        . ONGLYZA 5 MG TABS tablet TAKE ONE TABLET BY MOUTH ONE TIME DAILY  30 tablet  0  . pioglitazone-metformin (ACTOPLUS MET) 15-850 MG per tablet TAKE ONE TABLET BY MOUTH TWICE DAILY WITH FOOD  60 tablet  0  . simvastatin (ZOCOR) 20 MG tablet TAKE ONE TABLET BY MOUTH ONE TIME DAILY  30 tablet  0  . DISCONTD: glimepiride (AMARYL) 2 MG tablet take 1 to  1.5 tablets  Before breakfast or as directed  45 tablet  6        Objective:   Physical Exam BP 140/90  Pulse 98  Temp(Src) 98.5 F (36.9 C) (Oral)  Wt 206 lb (93.441 kg) Well-developed well-nourished in no acute distress  Appears generally well tearful at times when discussing her stress. Lab Results  Component Value Date   HGBA1C 9.6* 05/06/2011       Assessment & Plan:   DM  worsened control   however this is because she has not been taking all the medicines as prescribed for a couple of reasons one financial and to stress. We reviewed at length options interventions she plans on exercising and following up with the nutritionist and we discussed pillboxes twice a day and other strategies to maintain compliance. Otherwise other options would be switched to Victoza  and/or insulin intervention but   the nonadherence is a big part of the problem.  We will have her come back in a shorter amount of time such as about 6 weeks in followup.  HT  irreg meds   Cold sores    LIPIDS also   Total visit > 50% spent counseling and coordinating  care

## 2011-05-30 NOTE — Patient Instructions (Addendum)
Use pill box  And take meds   Twice a day  As we discussed am and pm.  Take 1.5 of amaryl. In the am ( 3 mg  )   We could increase this if needed   Next step either  victoza like meds or insulin basal. And or adding welchol.

## 2011-06-04 ENCOUNTER — Ambulatory Visit: Payer: 59 | Admitting: *Deleted

## 2011-06-12 ENCOUNTER — Ambulatory Visit: Payer: Self-pay | Admitting: Obstetrics and Gynecology

## 2011-06-16 ENCOUNTER — Encounter: Payer: Self-pay | Admitting: *Deleted

## 2011-06-16 ENCOUNTER — Encounter: Payer: 59 | Attending: Internal Medicine | Admitting: *Deleted

## 2011-06-16 VITALS — Ht 63.5 in | Wt 203.3 lb

## 2011-06-16 DIAGNOSIS — E119 Type 2 diabetes mellitus without complications: Secondary | ICD-10-CM | POA: Insufficient documentation

## 2011-06-16 DIAGNOSIS — E785 Hyperlipidemia, unspecified: Secondary | ICD-10-CM | POA: Insufficient documentation

## 2011-06-16 DIAGNOSIS — E1165 Type 2 diabetes mellitus with hyperglycemia: Secondary | ICD-10-CM

## 2011-06-16 DIAGNOSIS — I10 Essential (primary) hypertension: Secondary | ICD-10-CM | POA: Insufficient documentation

## 2011-06-16 DIAGNOSIS — Z713 Dietary counseling and surveillance: Secondary | ICD-10-CM | POA: Insufficient documentation

## 2011-06-16 NOTE — Patient Instructions (Signed)
Goals:  Follow Diabetes Meal Plan as instructed (see yellow card).  Eat 3 meals and 2 snacks, or every 3-5 hrs.  Add lean protein foods to all meals/snacks.  Monitor glucose levels as instructed by your doctor.  Check at least fasting blood sugar every morning.  Aim for 2 times/day - second time 2 hours after first bite of meal.  Aim for 20-30 mins of physical activity 2-4 days a week.  Bring glucose log to your next nutrition visit.

## 2011-06-16 NOTE — Progress Notes (Signed)
Medical Nutrition Therapy:  Appt start time: 1030 end time:  1100.  Assessment:  Primary concerns today: T2DM, Hyperlipidemia, HTN.  Reports very stressful last few months. States she has not been able to incorporate the nutrition component into her life.  Reports FBG in 120s and 2 hr PPG in 200s. Returning to MD in 6-8 weeks for recheck of A1c.  Reports she was not compliant with medications d/t forgetting and financial reasons. A1c up from 7.7% to 9.8%.   Lab Results  Component Value Date   HGBA1C 9.6* 05/06/2011   MEDICATIONS: See medication list; reconciled with pt.   DIETARY INTAKE:  Usual eating pattern includes 3 meals and 1-2 snacks per day. Snacks on pretzels filled with peanut butter. Drinks only water.  Usual physical activity:  None; just purchased membership to [employer] needs: (Note: continue maintenance until f/u) 1500 calories 165-170 g carbohydrates 110-115 g protein 40-42 g fat  Progress Towards Goal(s):  In progress.   Nutritional Diagnosis:  Wheeler-2.1 Impaired nutrient utilization related to glucose metabolism as evidenced by A1c of 7.7% and patient reported excessive CHO intake.    Intervention/Goals:  Follow Diabetes Meal Plan as instructed (see yellow card).  Eat 3 meals and 2 snacks, or every 3-5 hrs.  Add lean protein foods to all meals/snacks.  Monitor glucose levels as instructed by your doctor.  Check at least fasting blood sugar every morning.  Aim for 2 times/day - second time 2 hours after first bite of meal.  Aim for 20-30 mins of physical activity 2 days a week.  Bring glucose log to your next nutrition visit.  Monitoring/Evaluation:  Dietary intake, exercise, A1c, BG trends, and body weight in 8 week(s).

## 2011-06-17 ENCOUNTER — Other Ambulatory Visit: Payer: Self-pay | Admitting: Internal Medicine

## 2011-06-21 ENCOUNTER — Other Ambulatory Visit: Payer: Self-pay | Admitting: Internal Medicine

## 2011-08-10 ENCOUNTER — Other Ambulatory Visit: Payer: Self-pay | Admitting: Internal Medicine

## 2011-08-11 ENCOUNTER — Encounter: Payer: Self-pay | Admitting: Obstetrics and Gynecology

## 2011-08-11 ENCOUNTER — Ambulatory Visit (INDEPENDENT_AMBULATORY_CARE_PROVIDER_SITE_OTHER): Payer: 59 | Admitting: Obstetrics and Gynecology

## 2011-08-11 VITALS — BP 150/82 | Ht 63.5 in | Wt 205.0 lb

## 2011-08-11 DIAGNOSIS — N63 Unspecified lump in unspecified breast: Secondary | ICD-10-CM | POA: Insufficient documentation

## 2011-08-11 DIAGNOSIS — R928 Other abnormal and inconclusive findings on diagnostic imaging of breast: Secondary | ICD-10-CM

## 2011-08-11 DIAGNOSIS — Z124 Encounter for screening for malignant neoplasm of cervix: Secondary | ICD-10-CM

## 2011-08-11 NOTE — Addendum Note (Signed)
Addended by: Lerry Liner D on: 08/11/2011 04:50 PM   Modules accepted: Orders

## 2011-08-11 NOTE — Progress Notes (Signed)
Last Pap: 06/03/10 cryo in early 20s WNL: Yes Regular Periods:no Contraception: none  Monthly Breast exam:yes Tetanus<60yrs:yes Nl.Bladder Function:yes Daily BMs:yes Healthy Diet:yes Calcium:no Mammogram:yes Due for 61month re eval from last mammogram  Date of Mammogram: 01/13/11 Exercise:no Have often Exercise:none Seatbelt: yes Abuse at home: no Stressful work:no Sigmoid-colonoscopy: 56yrs ago-IBS Dr.Stark Bone Density: No PCP: Dr. Fabian Sharp Change in PMH: no change Change in FMH:no change BMI-36 Subjective:    [name] [name] is a 61 y.o. female G3P3 who presents for annual exam.  The patient has no complaints today.   The following portions of the patient's history were reviewed and updated as appropriate: allergies, current medications, past family history, past medical history, past social history, past surgical history and problem list.  Review of Systems Pertinent items are noted in HPI. Gastrointestinal:No change in bowel habits, no abdominal pain, no rectal bleeding Genitourinary:negative for dysuria, frequency, hematuria, nocturia and urinary incontinence    Objective:     BP 150/82  Ht 5' 3.5" (1.613 m)  Wt 205 lb (92.987 kg)  BMI 35.74 kg/m2  Weight:  Wt Readings from Last 1 Encounters:  08/11/11 205 lb (92.987 kg)     BMI: Body mass index is 35.74 kg/(m^2). General Appearance: Alert, appropriate appearance for age. No acute distress HEENT: Grossly normal Neck / Thyroid: Supple, no masses, nodes or enlargement Lungs: clear to auscultation bilaterally Back: No CVA tenderness Breast Exam: No masses or nodes.No dimpling, nipple retraction or discharge. Cardiovascular: Regular rate and rhythm. S1, S2, no murmur Gastrointestinal: Soft, non-tender, no masses or organomegaly Pelvic Exam: External genitalia: normal general appearance Vaginal: atrophic mucosa Cervix: atrophic Adnexa: non palpable Uterus: exam limited by body habitus Rectovaginal: normal rectal,  no masses Lymphatic Exam: Non-palpable nodes in neck, clavicular, axillary, or inguinal regions Skin: no rash or abnormalities Neurologic: Normal gait and speech, no tremor  Psychiatric: Alert and oriented, appropriate affect.    Urinalysis:Not done      Assessment:    Menopause  Right breast irregularity on MG   Plan:    All questions answered. Mammogram followup with [place] as recommended Pap smear. with HPV  Follow-up:  for annual exam

## 2011-08-12 LAB — PAP IG AND HPV HIGH-RISK

## 2011-08-19 ENCOUNTER — Ambulatory Visit: Payer: 59

## 2011-08-22 ENCOUNTER — Ambulatory Visit
Admission: RE | Admit: 2011-08-22 | Discharge: 2011-08-22 | Disposition: A | Discharge status: Home / Self Care | Payer: 59 | Source: Ambulatory Visit | Attending: Obstetrics and Gynecology | Admitting: Obstetrics and Gynecology

## 2011-08-22 DIAGNOSIS — N63 Unspecified lump in unspecified breast: Secondary | ICD-10-CM

## 2011-09-09 ENCOUNTER — Other Ambulatory Visit: Payer: Self-pay | Admitting: Internal Medicine

## 2011-09-09 MED ORDER — LOSARTAN POTASSIUM 50 MG PO TABS: 50.0000 mg | ORAL_TABLET | Freq: Every day | ORAL | Status: DC

## 2011-10-01 ENCOUNTER — Ambulatory Visit: Payer: 59 | Admitting: Internal Medicine

## 2011-10-07 ENCOUNTER — Ambulatory Visit (INDEPENDENT_AMBULATORY_CARE_PROVIDER_SITE_OTHER): Payer: 59 | Admitting: Internal Medicine

## 2011-10-07 ENCOUNTER — Encounter: Payer: Self-pay | Admitting: Internal Medicine

## 2011-10-07 VITALS — BP 108/70 | HR 103 | Temp 98.0°F | Wt 204.0 lb

## 2011-10-07 DIAGNOSIS — E1165 Type 2 diabetes mellitus with hyperglycemia: Secondary | ICD-10-CM

## 2011-10-07 DIAGNOSIS — E785 Hyperlipidemia, unspecified: Secondary | ICD-10-CM

## 2011-10-07 DIAGNOSIS — K589 Irritable bowel syndrome without diarrhea: Secondary | ICD-10-CM | POA: Insufficient documentation

## 2011-10-07 DIAGNOSIS — IMO0001 Reserved for inherently not codable concepts without codable children: Secondary | ICD-10-CM

## 2011-10-07 DIAGNOSIS — I10 Essential (primary) hypertension: Secondary | ICD-10-CM

## 2011-10-07 MED ORDER — SAXAGLIPTIN HCL 5 MG PO TABS: 5.0000 mg | ORAL_TABLET | Freq: Every day | ORAL | Status: DC

## 2011-10-07 NOTE — Patient Instructions (Signed)
Continue lifestyle intervention healthy eating and exercise . Try probiotic for the bowel sx ;sometimes the metformin can cause a problem.  Align   May have a coupon on line.   Get fasting ;labs  This Thursday and then will plan follow up.

## 2011-10-07 NOTE — Progress Notes (Signed)
  Subjective:    Patient ID: [name] [name], female    DOB: 1950/06/26, 61 y.o.   MRN: [id number]  HPI  Patient comes in today for follow up of  multiple medical problems.  Her last visit was in the spring and she was due for a followup in 5-6 weeks however she's a little late coming in. She states however she's doing better Taking meds more regularly now with different strategies Bp good BG dm  Doing better   With pill box  last reading was 139 in am   no hypoglycemia. Denies any vision changes new numbness in the feet.  Has not had laboratory tests done before this visit. New problem she's been having with her stomach has a history of IBS diagnosed a number of years ago but has some urgency and loose bowels at times probably 50% of the days of the week. There is no blood or vomiting weight loss fever.  She doesn't think it's related to a specific thing in her diet but is not sure. Review of Systems Negative for fever weight loss bleeding vision changes new chest pain or shortness of breath see above no current cough Past history family history social history reviewed in the electronic medical record.   Objective:   Physical Exam BP 108/70  Pulse 103  Temp 98 F (36.7 C) (Oral)  Wt 204 lb (92.534 kg)  SpO2 98% Neck: Supple without adenopathy or masses or bruits Chest:  Clear to A&P without wheezes rales or rhonchi CV:  S1-S2 no gallops or murmurs peripheral perfusion is normal Abdomen:  Sof,t normal bowel sounds without hepatosplenomegaly, no guarding rebound or masses no CVA tenderness No clubbing cyanosis or edema Diabetes check of feet normal neurologically grossly normal    Assessment & Plan:  DM prev out of control pt says better   With adherance but hasn't had it checked recently needs hemoglobin A1c but because she needs other labs she will get fasting labs this week. BX close A1c  GI IBS sx worse about 50 % of days it is possible it is from IBS also possible the metformin is  aggravating the situation however would not want to take her off of it without very good reason because of her diabetes. Align samples given plus over-the-counter and labs done if not getting better she is going to followup with [place] check a celiac panel today. If not getting better May need GI consult. She is seen I believe Dr. Russella Dar in the past  HT stable continue medication

## 2011-10-09 ENCOUNTER — Other Ambulatory Visit (INDEPENDENT_AMBULATORY_CARE_PROVIDER_SITE_OTHER): Payer: 59

## 2011-10-09 DIAGNOSIS — I10 Essential (primary) hypertension: Secondary | ICD-10-CM

## 2011-10-09 DIAGNOSIS — IMO0001 Reserved for inherently not codable concepts without codable children: Secondary | ICD-10-CM

## 2011-10-09 DIAGNOSIS — K589 Irritable bowel syndrome without diarrhea: Secondary | ICD-10-CM

## 2011-10-09 DIAGNOSIS — E785 Hyperlipidemia, unspecified: Secondary | ICD-10-CM

## 2011-10-09 DIAGNOSIS — E1165 Type 2 diabetes mellitus with hyperglycemia: Secondary | ICD-10-CM

## 2011-10-09 LAB — BASIC METABOLIC PANEL
BUN: 13 mg/dL (ref 6–23)
CO2: 25 mEq/L (ref 19–32)
Calcium: 8.8 mg/dL (ref 8.4–10.5)
GFR: 99.03 mL/min (ref >60.00)
Glucose, Bld: 148 mg/dL — ABNORMAL HIGH (ref 70–99)

## 2011-10-09 LAB — MICROALBUMIN / CREATININE URINE RATIO
Creatinine,U: 81.6 mg/dL
Microalb Creat Ratio: 1 mg/g (ref 0.0–30.0)
Microalb, Ur: 0.8 mg/dL (ref 0.0–1.9)

## 2011-10-09 LAB — LIPID PANEL
HDL: 60.7 mg/dL (ref >39.00)
LDL Cholesterol: 73 mg/dL (ref 0–99)
VLDL: 33.2 mg/dL (ref 0.0–40.0)

## 2011-10-09 LAB — CBC WITH DIFFERENTIAL/PLATELET
Basophils Absolute: 0 10*3/uL (ref 0.0–0.1)
Eosinophils Absolute: 0.1 10*3/uL (ref 0.0–0.7)
HCT: 39.5 % (ref 36.0–46.0)
Hemoglobin: 13 g/dL (ref 12.0–15.0)
Lymphocytes Relative: 39.9 % (ref 12.0–46.0)
Lymphs Abs: 2.4 10*3/uL (ref 0.7–4.0)
MCHC: 32.9 g/dL (ref 30.0–36.0)
Monocytes Relative: 7.2 % (ref 3.0–12.0)
Neutro Abs: 3 10*3/uL (ref 1.4–7.7)
Platelets: 272 10*3/uL (ref 150.0–400.0)
RDW: 13.4 % (ref 11.5–14.6)

## 2011-10-09 LAB — TSH: TSH: 0.27 u[IU]/mL — ABNORMAL LOW (ref 0.35–5.50)

## 2011-10-09 LAB — HEPATIC FUNCTION PANEL
AST: 23 U/L (ref 0–37)
Alkaline Phosphatase: 71 U/L (ref 39–117)
Total Bilirubin: 0.6 mg/dL (ref 0.3–1.2)

## 2011-10-10 LAB — CELIAC PANEL 10
Gliadin IgA: 14.8 U/mL (ref <20)
Gliadin IgG: 8.8 U/mL (ref <20)
IgA: 248 mg/dL (ref 69–380)
Tissue Transglutaminase Ab, IgA: 9.1 U/mL (ref <20)

## 2011-10-21 ENCOUNTER — Other Ambulatory Visit: Payer: Self-pay | Admitting: Family Medicine

## 2011-10-21 ENCOUNTER — Encounter: Payer: Self-pay | Admitting: Internal Medicine

## 2011-10-21 DIAGNOSIS — R946 Abnormal results of thyroid function studies: Secondary | ICD-10-CM

## 2011-11-14 ENCOUNTER — Other Ambulatory Visit: Payer: Self-pay | Admitting: Internal Medicine

## 2011-12-31 ENCOUNTER — Other Ambulatory Visit: Payer: Self-pay | Admitting: Internal Medicine

## 2012-01-05 ENCOUNTER — Other Ambulatory Visit (INDEPENDENT_AMBULATORY_CARE_PROVIDER_SITE_OTHER): Payer: 59

## 2012-01-05 DIAGNOSIS — R946 Abnormal results of thyroid function studies: Secondary | ICD-10-CM

## 2012-01-05 LAB — T3, FREE: T3, Free: 2.7 pg/mL (ref 2.3–4.2)

## 2012-01-15 ENCOUNTER — Encounter: Payer: Self-pay | Admitting: Internal Medicine

## 2012-01-15 ENCOUNTER — Ambulatory Visit (INDEPENDENT_AMBULATORY_CARE_PROVIDER_SITE_OTHER): Payer: 59 | Admitting: Internal Medicine

## 2012-01-15 VITALS — BP 120/70 | HR 116 | Temp 98.3°F | Wt 213.0 lb

## 2012-01-15 DIAGNOSIS — E119 Type 2 diabetes mellitus without complications: Secondary | ICD-10-CM

## 2012-01-15 DIAGNOSIS — R946 Abnormal results of thyroid function studies: Secondary | ICD-10-CM

## 2012-01-15 DIAGNOSIS — Z23 Encounter for immunization: Secondary | ICD-10-CM

## 2012-01-15 DIAGNOSIS — I10 Essential (primary) hypertension: Secondary | ICD-10-CM

## 2012-01-15 DIAGNOSIS — L989 Disorder of the skin and subcutaneous tissue, unspecified: Secondary | ICD-10-CM

## 2012-01-15 NOTE — Patient Instructions (Signed)
Monitor your  Thyroid for now  Check skin areas at next visit  The leg area seems like a wart  Can use wart medicine.      If needed at next visit consider victoza or onglyza  Injectable.   This can help  Lose weight and to help with diabetes control. consider endocrinology evaluation if needed for thyroid and or diabetes control.

## 2012-01-15 NOTE — Progress Notes (Signed)
Chief Complaint  Patient presents with  . Follow-up    Labs  . Diabetes    thyroid test    HPI: Patient comes in today for follow up of  multiple medical problems.  Since last visit diarrhea is some better  Has ibs used the align samples may have helped some.  No v no blood. DM  Sugars 120 130 in am some pp in 200 s hard to lose weight per pt and no exercise now.  No lows.  No vision or neuro changes. BP  doing ok  No change meds  check skin spots right leg  Crusty for a few weeks.   ROS: See pertinent positives and negatives per HPI.no current palpitation skin change rashes  No hx of thyroid disease but had low tsh one other time.  Past Medical History  Diagnosis Date  . Allergic rhinitis   . Diabetes mellitus   . Hyperlipidemia   . Hypertension   . IBS (irritable bowel syndrome)   . Sciatica   . Normal nuclear stress test 1 /08    low risk study     Family History  Problem Relation Age of Onset  . Acute myelogenous leukemia Mother     died when pat age 49   . Diabetes Father   . Diabetes Paternal Grandfather     History   Social History  . Marital Status: Legally Separated    Spouse Name: N/A    Number of Children: N/A  . Years of Education: N/A   Social History Main Topics  . Smoking status: Never Smoker   . Smokeless tobacco: Never Used  . Alcohol Use: No  . Drug Use: No  . Sexually Active: None   Other Topics Concern  . 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 FreeStyle Unistick II Lancets MISC by Does not apply route.        Marland Kitchen glimepiride (AMARYL) 2 MG tablet take  1.5 tablets  Before breakfast or as directed  135 tablet  2  . Glucose Blood (FREESTYLE TEST VI) by In Vitro route.        Marland Kitchen  glycopyrrolate (ROBINUL) 2 MG tablet Take 1 tablet (2 mg total) by mouth 2 (two) times daily.  60 tablet  1  . losartan (COZAAR) 50 MG tablet TAKE ONE TABLET BY MOUTH ONE TIME DAILY  30 tablet  5  . MULTIPLE VITAMIN PO Take by mouth.        . pioglitazone-metformin (ACTOPLUS MET) 15-850 MG per tablet TAKE ONE TABLET BY MOUTH TWICE DAILY WITH FOOD  60 tablet  2  . saxagliptin HCl (ONGLYZA) 5 MG TABS tablet Take 1 tablet (5 mg total) by mouth daily.  30 tablet  11  . simvastatin (ZOCOR) 20 MG tablet TAKE ONE TABLET BY MOUTH ONE TIME DAILY  30 tablet  6    EXAM:  BP 120/70  Pulse 116  Temp 98.3 F (36.8 C) (Oral)  Wt 213 lb (96.616 kg)  SpO2 94%  There is no height on file to calculate BMI. Wt Readings from Last 3 Encounters:  01/15/12 213 lb (96.616 kg)  10/07/11 204 lb (92.534 kg)  08/11/11 205 lb (92.987 kg)  GENERAL: vitals reviewed and listed  above, alert, oriented, appears well hydrated and in no acute distress  HEENT: atraumatic, conjunctiva  clear, no obvious abnormalities on inspection of external nose and ears OP : no lesion edema or exudate   NECK: no obvious masses on inspection palpation   LUNGS: clear to auscultation bilaterally, no wheezes, rales or rhonchi, good air movement  CV: HRRR, no clubbing cyanosis or  peripheral edema nl cap refill  Skin right lower leg 2-3 mm warty lesion round  And few faint papules  Face left temple one poss angioma and 3-4 mm flat tan  Mole  Regular and smooth. MS: moves all extremities without noticeable focal  abnormality  PSYCH: pleasant and cooperative, no obvious depression or anxiety Lab Results  Component Value Date   WBC 6.1 10/09/2011   HGB 13.0 10/09/2011   HCT 39.5 10/09/2011   PLT 272.0 10/09/2011   GLUCOSE 148* 10/09/2011   CHOL 167 10/09/2011   TRIG 166.0* 10/09/2011   HDL 60.70 10/09/2011   LDLDIRECT 132.6 01/14/2010   LDLCALC 73 10/09/2011   ALT 26 10/09/2011   AST 23 10/09/2011   NA 136 10/09/2011   K 4.2 10/09/2011   CL  102 10/09/2011   CREATININE 0.7 10/09/2011   BUN 13 10/09/2011   CO2 25 10/09/2011   TSH 1.01 01/05/2012   HGBA1C 7.1* 10/09/2011   MICROALBUR 0.8 10/09/2011    ASSESSMENT AND PLAN:  Discussed the following assessment and plan:  1. DIABETES MELLITUS, TYPE II    a1c not done this last testt  HO on victoza  or byetta if not controlled  at next visit hesitant to use an injectable   2. HYPERTENSION    controlled   3. THYROID FUNCTION TEST, ABNORMAL    now nl disc subclinical hyperthyroid wants to wait and follow for now  4. Need for prophylactic vaccination and inoculation against influenza   5. Skin lesion    prob wart  face lesions seem benign will follow  weight loss strategies   -Patient advised to return or notify health care team  immediately if symptoms worsen or persist or new concerns arise.  Patient Instructions  Monitor your  Thyroid for now  Check skin areas at next visit  The leg area seems like a wart  Can use wart medicine.      If needed at next visit consider victoza or onglyza  Injectable.   This can help  Lose weight and to help with diabetes control. consider endocrinology evaluation if needed for thyroid and or diabetes control.     Neta Mends. Marik Sedore M.D.

## 2012-03-30 ENCOUNTER — Other Ambulatory Visit: Payer: Self-pay | Admitting: Obstetrics and Gynecology

## 2012-03-30 DIAGNOSIS — Z1231 Encounter for screening mammogram for malignant neoplasm of breast: Secondary | ICD-10-CM

## 2012-04-12 ENCOUNTER — Ambulatory Visit: Payer: 59

## 2012-04-19 ENCOUNTER — Ambulatory Visit: Payer: 59

## 2012-04-26 ENCOUNTER — Ambulatory Visit: Payer: 59

## 2012-05-10 ENCOUNTER — Ambulatory Visit
Admission: RE | Admit: 2012-05-10 | Discharge: 2012-05-10 | Disposition: A | Discharge status: Home / Self Care | Payer: 59 | Source: Ambulatory Visit | Attending: Obstetrics and Gynecology | Admitting: Obstetrics and Gynecology

## 2012-05-10 DIAGNOSIS — Z1231 Encounter for screening mammogram for malignant neoplasm of breast: Secondary | ICD-10-CM

## 2012-05-12 LAB — HM MAMMOGRAPHY

## 2012-06-06 ENCOUNTER — Other Ambulatory Visit: Payer: Self-pay | Admitting: Internal Medicine

## 2012-06-11 ENCOUNTER — Other Ambulatory Visit (INDEPENDENT_AMBULATORY_CARE_PROVIDER_SITE_OTHER): Payer: 59

## 2012-06-11 DIAGNOSIS — R946 Abnormal results of thyroid function studies: Secondary | ICD-10-CM

## 2012-06-11 DIAGNOSIS — IMO0001 Reserved for inherently not codable concepts without codable children: Secondary | ICD-10-CM

## 2012-06-11 LAB — T4, FREE: Free T4: 0.99 ng/dL (ref 0.60–1.60)

## 2012-06-11 LAB — TSH: TSH: 0.86 u[IU]/mL (ref 0.35–5.50)

## 2012-06-14 ENCOUNTER — Ambulatory Visit (INDEPENDENT_AMBULATORY_CARE_PROVIDER_SITE_OTHER): Payer: 59 | Admitting: Internal Medicine

## 2012-06-14 ENCOUNTER — Encounter: Payer: Self-pay | Admitting: Internal Medicine

## 2012-06-14 VITALS — BP 120/70 | HR 108 | Temp 98.2°F | Wt 211.0 lb

## 2012-06-14 DIAGNOSIS — I10 Essential (primary) hypertension: Secondary | ICD-10-CM

## 2012-06-14 DIAGNOSIS — M79606 Pain in leg, unspecified: Secondary | ICD-10-CM

## 2012-06-14 DIAGNOSIS — M79609 Pain in unspecified limb: Secondary | ICD-10-CM

## 2012-06-14 DIAGNOSIS — E119 Type 2 diabetes mellitus without complications: Secondary | ICD-10-CM

## 2012-06-14 DIAGNOSIS — E785 Hyperlipidemia, unspecified: Secondary | ICD-10-CM

## 2012-06-14 MED ORDER — MELOXICAM 7.5 MG PO TABS: ORAL_TABLET | ORAL | Status: DC

## 2012-06-14 MED ORDER — GLIMEPIRIDE 4 MG PO TABS: 4.0000 mg | ORAL_TABLET | Freq: Every day | ORAL | Status: DC

## 2012-06-14 NOTE — Patient Instructions (Addendum)
Increase the amaryl.  To 4 mg for now.     Can monitor for low blood sugar but don't think .   This will happen..  Check  readings as best .. Possible.    Hg a1c in 3 months   Stop the lipid medication temporarily 3 weeks  Simvastation.  To see if any help   And we should have you see orthopedics.   Can take   meloxicam short term to see if helps.

## 2012-06-14 NOTE — Progress Notes (Signed)
Chief Complaint  Patient presents with  . Follow-up    Hasn't checked sugar in a few weeks.    HPI: Patient comes in today for follow up of  multiple medical problems.  Aches and pains  Effecting sleep sides ofd legs  To knees   Worse at night.   Uncertain if she's having any side effects of the cholesterol medicine thinks it's more her joints. Diabetes hasn't really been checking her sugars,  is really resistant to any injectable medication no lobe blood sugars ROS: See pertinent positives and negatives per HPI.  Past Medical History  Diagnosis Date  . Allergic rhinitis   . Diabetes mellitus   . Hyperlipidemia   . Hypertension   . IBS (irritable bowel syndrome)   . Sciatica   . Normal nuclear stress test 1 /08    low risk study     Family History  Problem Relation Age of Onset  . Acute myelogenous leukemia Mother     died when pat age 80   . Diabetes Father   . Diabetes Paternal Grandfather     History   Social History  . Marital Status: Legally Separated    Spouse Name: N/A    Number of Children: N/A  . Years of Education: N/A   Social History Main Topics  . Smoking status: Never Smoker   . Smokeless tobacco: Never Used  . Alcohol Use: No  . Drug Use: No  . Sexually Active: None   Other Topics Concern  . 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 FreeStyle Unistick II Lancets MISC by Does not apply route.        . Glucose Blood (FREESTYLE TEST VI) by In Vitro route.        Marland Kitchen glycopyrrolate (ROBINUL) 2 MG tablet Take 1 tablet (2 mg total) by mouth 2 (two) times daily.  60 tablet  1  . losartan (COZAAR) 50 MG tablet TAKE ONE TABLET BY MOUTH ONE TIME DAILY  30 tablet  5  . MULTIPLE  VITAMIN PO Take by mouth.        . pioglitazone-metformin (ACTOPLUS MET) 15-850 MG per tablet TAKE ONE TABLET BY MOUTH TWICE DAILY WITH FOOD  60 tablet  1  . saxagliptin HCl (ONGLYZA) 5 MG TABS tablet Take 1 tablet (5 mg total) by mouth daily.  30 tablet  11  . simvastatin (ZOCOR) 20 MG tablet TAKE ONE TABLET BY MOUTH ONE TIME DAILY  30 tablet  6  . [DISCONTINUED] glimepiride (AMARYL) 2 MG tablet take  1.5 tablets  Before breakfast or as directed  135 tablet  2  . glimepiride (AMARYL) 4 MG tablet Take 1 tablet (4 mg total) by mouth daily before breakfast.  90 tablet  3  . [DISCONTINUED] meloxicam (MOBIC) 7.5 MG tablet Take 1-2 per day for pain  30 tablet  1   No facility-administered encounter medications on file as of 06/14/2012.    EXAM:  BP 120/70  Pulse 108  Temp(Src) 98.2 F (36.8 C) (Oral)  Wt 211 lb (95.709 kg)  BMI 36.79 kg/m2  SpO2 97%  Body mass index is 36.79  kg/(m^2).  GENERAL: vitals reviewed and listed above, alert, oriented, appears well hydrated and in no acute distress  HEENT: atraumatic, conjunctiva  clear, no obvious abnormalities on inspection of external nose and ears OP : no lesion edema or exudate   NECK: no obvious masses on inspection palpation  LUNGS: clear to auscultation bilaterally, no wheezes, rales or rhonchi, good air movement  CV: HRRR, no clubbing cyanosis or  peripheral edema nl cap refill   MS: moves all extremities without noticeable focal  abnormality no specific joint swelling some DJD changes. Diabetic foot exam normal PSYCH: pleasant and cooperative, no obvious depression or anxiety Lab Results  Component Value Date   WBC 6.1 10/09/2011   HGB 13.0 10/09/2011   HCT 39.5 10/09/2011   PLT 272.0 10/09/2011   GLUCOSE 148* 10/09/2011   CHOL 167 10/09/2011   TRIG 166.0* 10/09/2011   HDL 60.70 10/09/2011   LDLDIRECT 132.6 01/14/2010   LDLCALC 73 10/09/2011   ALT 26 10/09/2011   AST 23 10/09/2011   NA 136 10/09/2011   K 4.2 10/09/2011   CL 102  10/09/2011   CREATININE 0.7 10/09/2011   BUN 13 10/09/2011   CO2 25 10/09/2011   TSH 0.86 06/11/2012   HGBA1C 8.2* 06/11/2012   MICROALBUR 0.8 10/09/2011    ASSESSMENT AND PLAN:  Discussed the following assessment and plan:  Type II or unspecified type diabetes mellitus without mention of complication, not stated as uncontrolled - Plan: POC Glucose (CBG)  HYPERTENSION  HYPERLIPIDEMIA  Leg pain, lateral, unspecified laterality - Plan: Ambulatory referral to Orthopedic Surgery Diabetes it is not going the right direction discussed other medication options we'll increase the Amaryl to 4 mg at this time need to do better job of dietary changes  In regard to her joint pains and possibly muscle aches would stop her statin and the short run to see if it makes any difference in try a mild anti-inflammatory and followup she cannot take Aleve because she gets so drowsiness-type feeling she can take ibuprofen but it wasn't very helpful. Denies any specific GI bleeding ulcers. She has leg radiating pain which could be back or hip ? Bursitis  related ;suggest see orthopedics seen Dr. Prince Rome in the past for other things -Patient advised to return or notify health care team  if symptoms worsen or persist or new concerns arise.  Patient Instructions  Increase the amaryl.  To 4 mg for now.     Can monitor for low blood sugar but don't think .   This will happen..  Check  readings as best .. Possible.    Hg a1c in 3 months   Stop the lipid medication temporarily 3 weeks  Simvastation.  To see if any help   And we should have you see orthopedics.   Can take   meloxicam short term to see if helps.    Neta Mends. Isiaha Greenup M.D.

## 2012-06-18 ENCOUNTER — Telehealth: Payer: Self-pay | Admitting: Internal Medicine

## 2012-06-18 DIAGNOSIS — M79606 Pain in leg, unspecified: Secondary | ICD-10-CM | POA: Insufficient documentation

## 2012-06-18 MED ORDER — CELECOXIB 200 MG PO CAPS: 200.0000 mg | ORAL_CAPSULE | Freq: Every day | ORAL | Status: DC

## 2012-06-18 NOTE — Telephone Encounter (Signed)
Can try celebrex   200 mg once a day  Disp 14  To see how she does .

## 2012-06-18 NOTE — Telephone Encounter (Signed)
Medication sent to the pharmacy.  Tried to inform the patient.  Called home/cell but the voicemail box has not been set up.

## 2012-06-18 NOTE — Telephone Encounter (Signed)
Patient states she was placed Mobic/Meloxicam 7.5mg  tablet on Monday 06/14/12. She reports diarrhea after taking the medication on Monday 06/14/12 and Tuesday 06/15/12.+Diarrhea which has since tapered off.  Reviewed Meloxicam side effects. Diarrhea and stomach discomfort are some side effects.  She is wondering can she try another medication. Allergy to Naproxen-"feel weird".  Pharmacy- [facility]  At [facility]. Please contact patient for any [phone].

## 2012-06-21 NOTE — Telephone Encounter (Signed)
Called home/cell.  Was unable to leave a message.  The voicemail has not been set up.

## 2012-06-22 NOTE — Telephone Encounter (Signed)
Spoke to the pt.  She did pick up rx on Friday.  So far has taken 1 pill and it has not upset her stomach.

## 2012-07-01 ENCOUNTER — Encounter: Payer: Self-pay | Admitting: Internal Medicine

## 2012-08-19 ENCOUNTER — Other Ambulatory Visit: Payer: Self-pay

## 2012-09-03 ENCOUNTER — Other Ambulatory Visit: Payer: Self-pay | Admitting: Internal Medicine

## 2012-09-07 ENCOUNTER — Other Ambulatory Visit: Payer: 59

## 2012-09-14 ENCOUNTER — Ambulatory Visit: Payer: 59 | Admitting: Internal Medicine

## 2012-09-24 ENCOUNTER — Other Ambulatory Visit (INDEPENDENT_AMBULATORY_CARE_PROVIDER_SITE_OTHER): Payer: 59

## 2012-09-24 DIAGNOSIS — IMO0001 Reserved for inherently not codable concepts without codable children: Secondary | ICD-10-CM

## 2012-09-24 LAB — HEMOGLOBIN A1C: Hgb A1c MFr Bld: 8.1 % — ABNORMAL HIGH (ref 4.6–6.5)

## 2012-10-01 ENCOUNTER — Ambulatory Visit: Payer: 59 | Admitting: Internal Medicine

## 2012-10-08 ENCOUNTER — Ambulatory Visit: Payer: 59 | Admitting: Internal Medicine

## 2012-10-19 ENCOUNTER — Encounter: Payer: Self-pay | Admitting: Internal Medicine

## 2012-10-19 ENCOUNTER — Ambulatory Visit (INDEPENDENT_AMBULATORY_CARE_PROVIDER_SITE_OTHER): Payer: 59 | Admitting: Internal Medicine

## 2012-10-19 VITALS — BP 130/76 | HR 102 | Temp 97.7°F | Wt 209.0 lb

## 2012-10-19 DIAGNOSIS — E669 Obesity, unspecified: Secondary | ICD-10-CM

## 2012-10-19 DIAGNOSIS — IMO0001 Reserved for inherently not codable concepts without codable children: Secondary | ICD-10-CM

## 2012-10-19 DIAGNOSIS — Z23 Encounter for immunization: Secondary | ICD-10-CM

## 2012-10-19 DIAGNOSIS — K589 Irritable bowel syndrome without diarrhea: Secondary | ICD-10-CM

## 2012-10-19 DIAGNOSIS — E785 Hyperlipidemia, unspecified: Secondary | ICD-10-CM

## 2012-10-19 DIAGNOSIS — E1165 Type 2 diabetes mellitus with hyperglycemia: Secondary | ICD-10-CM

## 2012-10-19 DIAGNOSIS — I10 Essential (primary) hypertension: Secondary | ICD-10-CM

## 2012-10-19 MED ORDER — PIOGLITAZONE HCL-METFORMIN HCL 15-850 MG PO TABS: ORAL_TABLET | ORAL | Status: DC

## 2012-10-19 NOTE — Patient Instructions (Addendum)
Continue lifestyle intervention healthy eating and exercise . No sweets . Can try adding new med class that most common side effect is uti and years tinfection but may not do this., it works but spilling sugar out in the urine and many people have weight loss with this,  Check bg fasting at least 3 x pere week and post eating  Also at least 3 x per week.  Contact us if not decreasing in a month Other wise  Hg a1c  Lipids and bmp and ROV in 3 months  ( before end of year) Overdue for these labs  If not improving we may get endocrinology involved .

## 2012-10-19 NOTE — Progress Notes (Signed)
Chief Complaint  Patient presents with  . Follow-up    HPI: Patient comes in today for follow up of  multiple medical problems.  DM no new sx trying harder to cut out sugars has decreased desserts to 1 or 2 at a week at the most is becoming more active your the last month with his grandson has moved out. Has lost a few pounds. Needs a refill on ACTOplus met. No history of low blood sugars. Blood sugars only occasionally 142/  -200. Really doesn't want to go on an injectable medicines asks about newer class of medicine she has seen on tv. ROS: See pertinent positives and negatives per HPI.  Past Medical History  Diagnosis Date  . Allergic rhinitis   . Diabetes mellitus   . Hyperlipidemia   . Hypertension   . IBS (irritable bowel syndrome)   . Sciatica   . Normal nuclear stress test 1 /08    low risk study     Family History  Problem Relation Age of Onset  . Acute myelogenous leukemia Mother     died when pat age 60   . Diabetes Father   . Diabetes Paternal Grandfather     History   Social History  . Marital Status: Legally Separated    Spouse Name: N/A    Number of Children: N/A  . Years of Education: N/A   Social History Main Topics  . Smoking status: Never Smoker   . Smokeless tobacco: Never Used  . Alcohol Use: No  . Drug Use: No  . Sexual Activity: None   Other Topics Concern  . 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 FreeStyle Unistick II Lancets MISC by Does not apply route.        Marland Kitchen glimepiride (AMARYL) 4 MG tablet Take 1 tablet (4 mg total) by mouth daily before breakfast.  90 tablet  3  . Glucose Blood (FREESTYLE TEST VI) by In Vitro route.        Marland Kitchen glycopyrrolate (ROBINUL) 2  MG tablet Take 1 tablet (2 mg total) by mouth 2 (two) times daily.  60 tablet  1  . losartan (COZAAR) 50 MG tablet TAKE ONE TABLET BY MOUTH ONE TIME DAILY  30 tablet  5  . MULTIPLE VITAMIN PO Take by mouth.        . pioglitazone-metformin (ACTOPLUS MET) 15-850 MG per tablet TAKE ONE TABLET BY MOUTH TWICE DAILY WITH FOOD  60 tablet  11  . saxagliptin HCl (ONGLYZA) 5 MG TABS tablet Take 1 tablet (5 mg total) by mouth daily.  30 tablet  11  . [DISCONTINUED] pioglitazone-metformin (ACTOPLUS MET) 15-850 MG per tablet TAKE ONE TABLET BY MOUTH TWICE DAILY WITH FOOD   60 tablet  0  . simvastatin (ZOCOR) 20 MG tablet TAKE ONE TABLET BY MOUTH ONE TIME DAILY  30 tablet  6  . [DISCONTINUED] celecoxib (CELEBREX) 200 MG capsule Take 1 capsule (200 mg total) by mouth daily.  14 capsule  0   No facility-administered encounter medications on file as of 10/19/2012.    EXAM:  BP 130/76  Pulse 102  Temp(Src) 97.7 F (  36.5 C) (Oral)  Wt 209 lb (94.802 kg)  BMI 36.44 kg/m2  SpO2 98%  Body mass index is 36.44 kg/(m^2).  GENERAL: vitals reviewed and listed above, alert, oriented, appears well hydrated and in no acute distress HEENT: atraumatic, conjunctiva  clear, no obvious abnormalities on inspection of external nose and ears  MS: moves all extremities without noticeable focal  abnormality PSYCH: pleasant and cooperative, no obvious depression or anxiety Lab Results  Component Value Date   WBC 6.1 10/09/2011   HGB 13.0 10/09/2011   HCT 39.5 10/09/2011   PLT 272.0 10/09/2011   GLUCOSE 148* 10/09/2011   CHOL 167 10/09/2011   TRIG 166.0* 10/09/2011   HDL 60.70 10/09/2011   LDLDIRECT 132.6 01/14/2010   LDLCALC 73 10/09/2011   ALT 26 10/09/2011   AST 23 10/09/2011   NA 136 10/09/2011   K 4.2 10/09/2011   CL 102 10/09/2011   CREATININE 0.7 10/09/2011   BUN 13 10/09/2011   CO2 25 10/09/2011   TSH 0.86 06/11/2012   HGBA1C 8.1* 09/24/2012   MICROALBUR 0.8 10/09/2011   Reviewed laboratory studies an A1c. ASSESSMENT AND  PLAN:  Discussed the following assessment and plan:  Diabetes mellitus type 2, uncontrolled  Need for prophylactic vaccination and inoculation against other viral diseases(V04.89) - Plan: Flu Vaccine QUAD 36+ mos PF IM (Fluarix)  IBS (irritable bowel syndrome)  HYPERTENSION  HYPERLIPIDEMIA  OBESITY Discussed other options. She should continue with her better lifestyle intervention reviewed samples of jardiance 10 mg given 1 po qd and coupon if wishes to continue this . There are others in class of med if covered better by insurance and works   Risk benefit of medication discussed. She is alos overdue for other monitoring labs  Can do at next check in 3 months or earlier if needed -Patient advised to return or notify health care team  if symptoms worsen or persist or new concerns arise.  Patient Instructions  Continue lifestyle intervention healthy eating and exercise . No sweets . Can try adding new med class that most common side effect is uti and years tinfection but may not do this., it works but spilling sugar out in the urine and many people have weight loss with this,  Check bg fasting at least 3 x pere week and post eating  Also at least 3 x per week.  Contact us if not decreasing in a month Other wise  Hg a1c  Lipids and bmp and ROV in 3 months  ( before end of year) Overdue for these labs  If not improving we may get endocrinology involved .    Neta Mends. Panosh M.D. healthy eating and exercise . No sweets . Can try adding new med class that most common side effect is uti and years tinfection but may not do this., it works but spilling sugar out in the urine and many people have weight loss with this,  Check bg fasting at least 3 x pere week and post eating  Also at least 3 x per week.  Contact us if not decreasing in a month Other wise  Hg a1c  Lipids and bmp and ROV in 3 months  ( before end of year) Overdue for these labs  If not improving we may get endocrinology involved .    Neta Mends. Panosh M.D.

## 2012-11-12 ENCOUNTER — Other Ambulatory Visit: Payer: Self-pay | Admitting: Internal Medicine

## 2012-12-30 ENCOUNTER — Encounter: Payer: Self-pay | Admitting: Family Medicine

## 2012-12-30 ENCOUNTER — Ambulatory Visit (INDEPENDENT_AMBULATORY_CARE_PROVIDER_SITE_OTHER): Payer: 59 | Admitting: Family Medicine

## 2012-12-30 VITALS — BP 128/76 | HR 123 | Temp 98.2°F | Wt 206.0 lb

## 2012-12-30 DIAGNOSIS — L259 Unspecified contact dermatitis, unspecified cause: Secondary | ICD-10-CM

## 2012-12-30 MED ORDER — PREDNISONE 10 MG PO TABS: ORAL_TABLET | ORAL | Status: DC

## 2012-12-30 NOTE — Progress Notes (Signed)
Pre visit review using our clinic review tool, if applicable. No additional management support is needed unless otherwise documented below in the visit note. 

## 2012-12-30 NOTE — Progress Notes (Signed)
  Subjective:    Patient ID: [name] [name], female    DOB: [birth year], 62 y.o.   MRN: [id number]  HPI Here for 3 days of itchy rash all over the body including the face. Last weekend she cleared a lot of brush and vines off her property.    Review of Systems  Constitutional: Negative.   Skin: Positive for rash.       Objective:   Physical Exam  Constitutional: She appears well-developed and well-nourished.  Cardiovascular: Normal rate, regular rhythm, normal heart sounds and intact distal pulses.   Pulmonary/Chest: Effort normal and breath sounds normal.  Skin:  Red macules and vesicles as above           Assessment & Plan:  Add Benadryl prn

## 2013-01-22 ENCOUNTER — Other Ambulatory Visit: Payer: Self-pay | Admitting: Internal Medicine

## 2013-03-26 IMAGING — CR DG CHEST 2V
2 series · 2 of 2 positions shown · non-contrast
Comparison: 01/27/2006

CLINICAL DATA: Cough.  Fever.  Diabetes and hypertension.

CHEST - 2 VIEW

[view not recorded (1 of 2)]
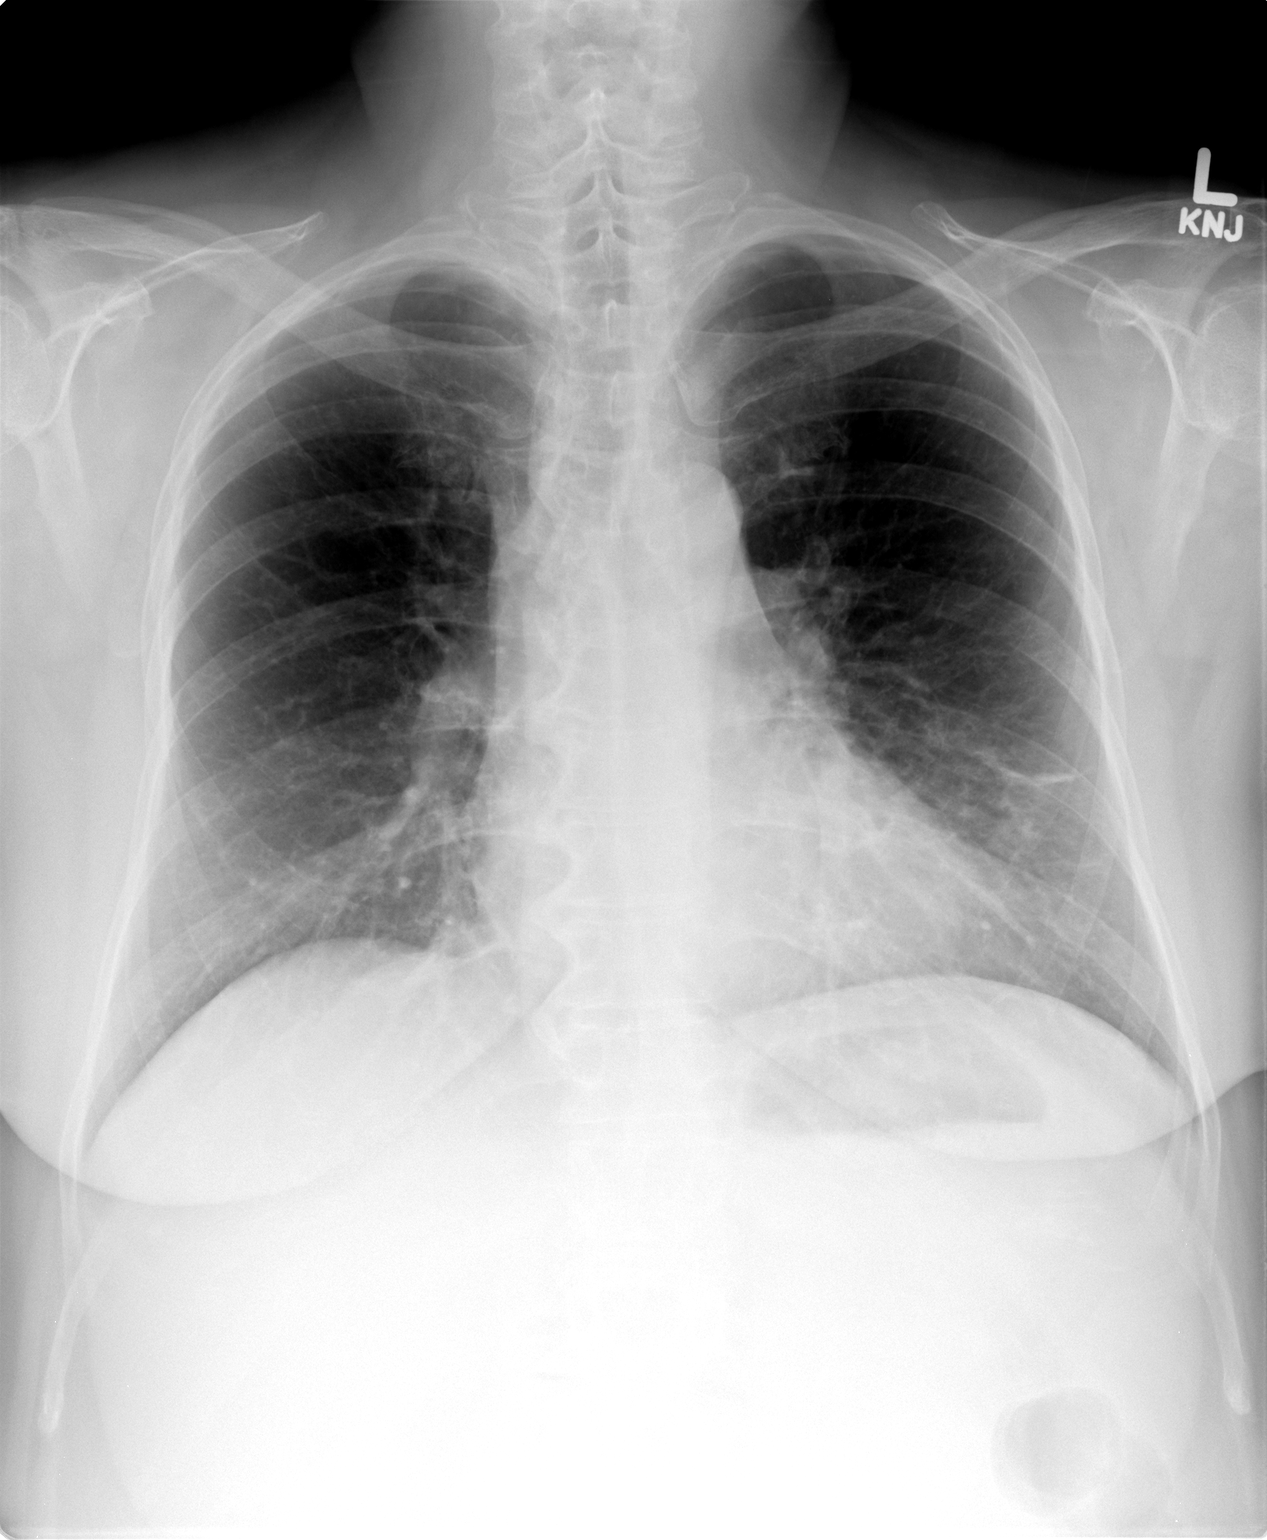

[view not recorded (2 of 2)]
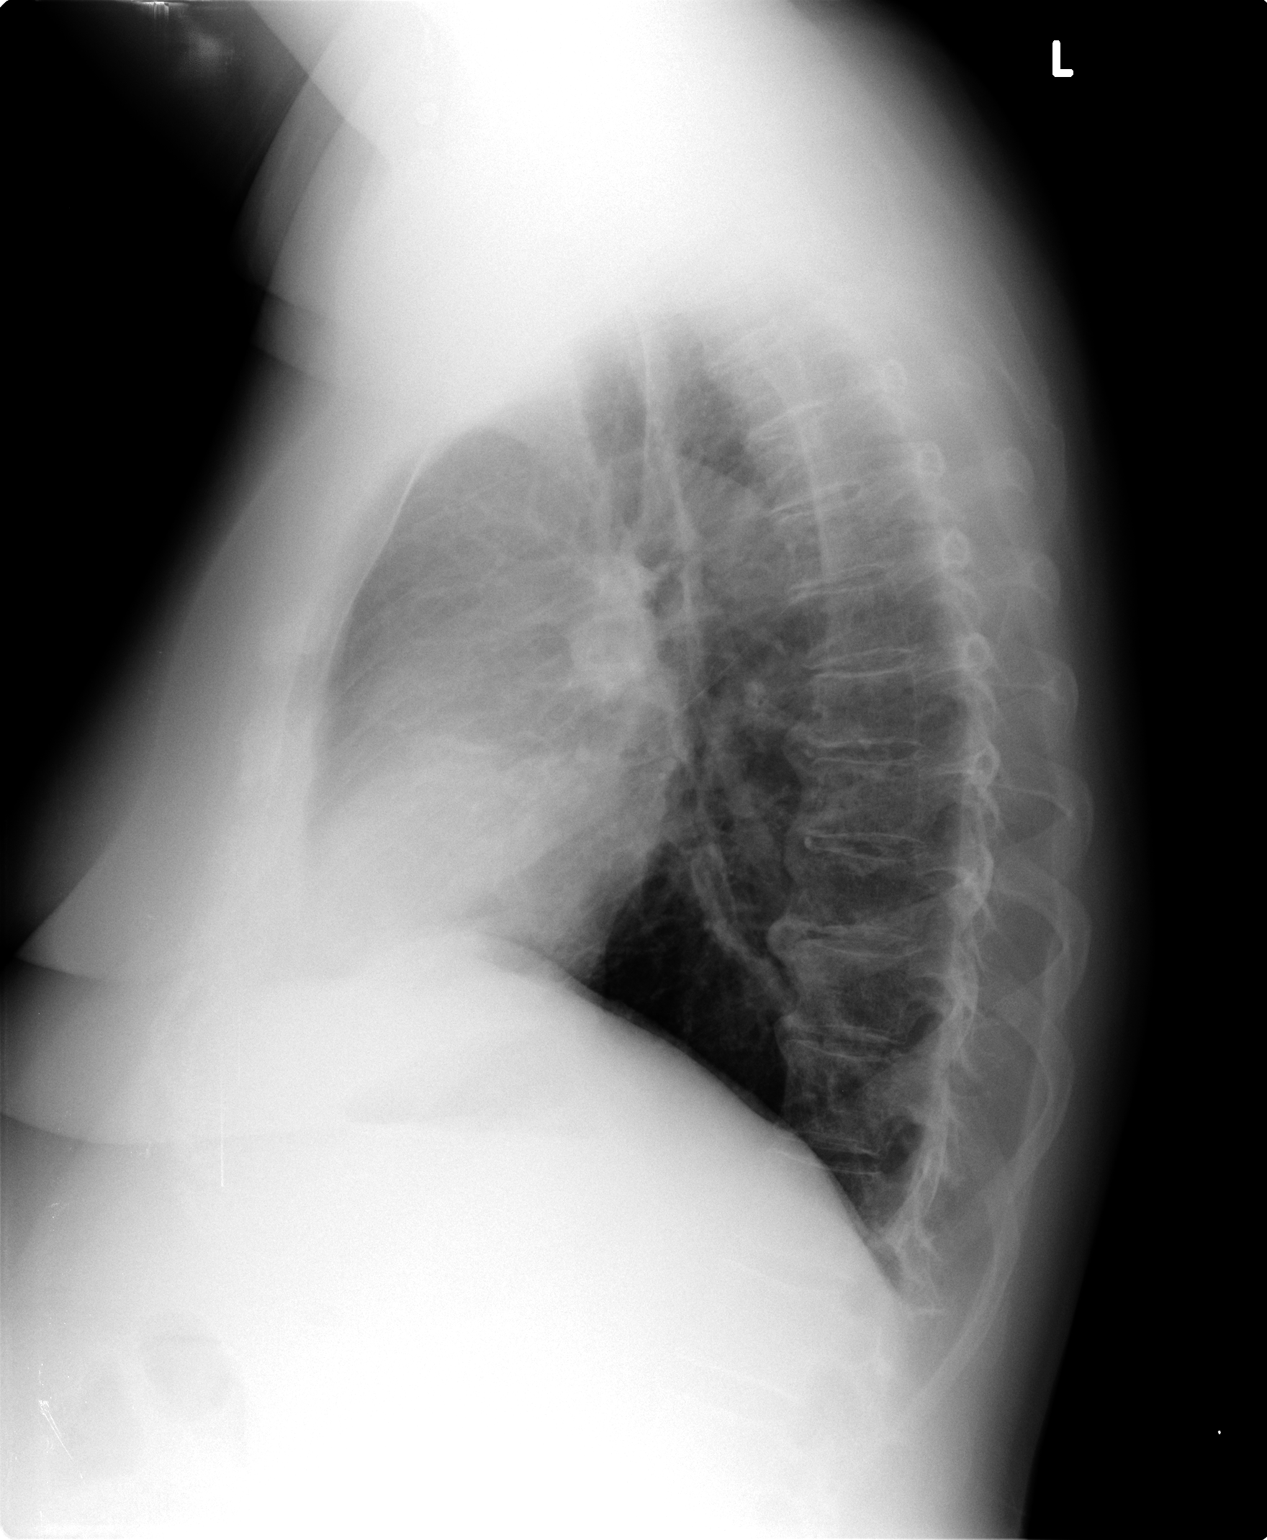

[2 of 2 positions shown; findings below may reference images not displayed]

FINDINGS: Mild linear opacity is seen in the lingula which is new
since prior study and may be due to atelectasis or scarring.  No
evidence of pulmonary air space disease or pleural effusion.  No
mass or adenopathy identified.  Heart size is normal.
IMPRESSION: 1.  Mild linear atelectasis or scarring.
2.  No evidence of pneumonia or pleural effusion.

## 2013-09-08 ENCOUNTER — Telehealth: Payer: Self-pay | Admitting: *Deleted

## 2013-09-08 DIAGNOSIS — E785 Hyperlipidemia, unspecified: Secondary | ICD-10-CM

## 2013-09-08 DIAGNOSIS — E119 Type 2 diabetes mellitus without complications: Secondary | ICD-10-CM

## 2013-09-08 MED ORDER — SAXAGLIPTIN HCL 5 MG PO TABS: ORAL_TABLET | ORAL | Status: DC

## 2013-09-08 NOTE — Telephone Encounter (Signed)
Patient coming in for follow up DM Lipid, a1c. Bmet, micro albumin ordered Diabetic bundle

## 2013-09-15 ENCOUNTER — Other Ambulatory Visit (INDEPENDENT_AMBULATORY_CARE_PROVIDER_SITE_OTHER): Payer: 59

## 2013-09-15 DIAGNOSIS — E785 Hyperlipidemia, unspecified: Secondary | ICD-10-CM

## 2013-09-15 DIAGNOSIS — R946 Abnormal results of thyroid function studies: Secondary | ICD-10-CM

## 2013-09-15 DIAGNOSIS — R799 Abnormal finding of blood chemistry, unspecified: Secondary | ICD-10-CM

## 2013-09-15 DIAGNOSIS — E119 Type 2 diabetes mellitus without complications: Secondary | ICD-10-CM

## 2013-09-15 LAB — LIPID PANEL
CHOLESTEROL: 209 mg/dL — AB (ref 0–200)
HDL: 59.8 mg/dL (ref >39.00)
NonHDL: 149.2
Total CHOL/HDL Ratio: 3
Triglycerides: 205 mg/dL — ABNORMAL HIGH (ref 0.0–149.0)
VLDL: 41 mg/dL — AB (ref 0.0–40.0)

## 2013-09-15 LAB — BASIC METABOLIC PANEL
BUN: 11 mg/dL (ref 6–23)
CALCIUM: 9 mg/dL (ref 8.4–10.5)
CO2: 24 mEq/L (ref 19–32)
CREATININE: 0.8 mg/dL (ref 0.4–1.2)
Chloride: 103 mEq/L (ref 96–112)
GFR: 82.14 mL/min (ref >60.00)
GLUCOSE: 184 mg/dL — AB (ref 70–99)
Potassium: 4.6 mEq/L (ref 3.5–5.1)
Sodium: 137 mEq/L (ref 135–145)

## 2013-09-15 LAB — HEMOGLOBIN A1C: HEMOGLOBIN A1C: 9.2 % — AB (ref 4.6–6.5)

## 2013-09-15 LAB — MICROALBUMIN / CREATININE URINE RATIO
CREATININE, U: 65.4 mg/dL
MICROALB UR: 0.1 mg/dL (ref 0.0–1.9)
MICROALB/CREAT RATIO: 0.2 mg/g (ref 0.0–30.0)

## 2013-09-15 LAB — TSH: TSH: 1.17 u[IU]/mL (ref 0.35–4.50)

## 2013-09-15 LAB — LDL CHOLESTEROL, DIRECT: LDL DIRECT: 137.5 mg/dL

## 2013-09-29 ENCOUNTER — Ambulatory Visit: Payer: 59 | Admitting: Internal Medicine

## 2013-10-06 ENCOUNTER — Encounter: Payer: Self-pay | Admitting: Internal Medicine

## 2013-10-06 ENCOUNTER — Ambulatory Visit (INDEPENDENT_AMBULATORY_CARE_PROVIDER_SITE_OTHER): Payer: 59 | Admitting: Internal Medicine

## 2013-10-06 VITALS — BP 138/80 | Temp 98.1°F | Ht 63.5 in | Wt 209.0 lb

## 2013-10-06 DIAGNOSIS — IMO0002 Reserved for concepts with insufficient information to code with codable children: Secondary | ICD-10-CM

## 2013-10-06 DIAGNOSIS — E785 Hyperlipidemia, unspecified: Secondary | ICD-10-CM

## 2013-10-06 DIAGNOSIS — R6884 Jaw pain: Secondary | ICD-10-CM

## 2013-10-06 DIAGNOSIS — I10 Essential (primary) hypertension: Secondary | ICD-10-CM

## 2013-10-06 DIAGNOSIS — Z23 Encounter for immunization: Secondary | ICD-10-CM

## 2013-10-06 DIAGNOSIS — E1165 Type 2 diabetes mellitus with hyperglycemia: Secondary | ICD-10-CM

## 2013-10-06 DIAGNOSIS — IMO0001 Reserved for inherently not codable concepts without codable children: Secondary | ICD-10-CM

## 2013-10-06 LAB — GLUCOSE, POCT (MANUAL RESULT ENTRY): POC Glucose: 234 mg/dl — AB (ref 70–99)

## 2013-10-06 MED ORDER — FREESTYLE UNISTICK II LANCETS MISC: Status: DC

## 2013-10-06 MED ORDER — GLUCOSE BLOOD VI STRP: ORAL_STRIP | Status: AC

## 2013-10-06 MED ORDER — LIRAGLUTIDE 18 MG/3ML ~~LOC~~ SOPN: PEN_INJECTOR | SUBCUTANEOUS | Status: DC

## 2013-10-06 MED ORDER — INSULIN PEN NEEDLE 32G X 4 MM MISC: Status: DC

## 2013-10-06 MED ORDER — ATORVASTATIN CALCIUM 20 MG PO TABS: 20.0000 mg | ORAL_TABLET | Freq: Every day | ORAL | Status: DC

## 2013-10-06 NOTE — Patient Instructions (Addendum)
Begin victoza injections  Each day.    .6 each am  After a week increase to 1.2 . Check BGs  Twice a day to make sure no low sugars. Can stop the amaryl ( or at least cut in 1/2)    To avoid low blood sugars  Initially will have some nausea.  And decrease appetite .  Begin atorvastatin for your cholesterol .  Get back on blood pressure med .  Plan labs lipid and hga1c in 3 months    OV or Send in readings of BG in 1 month depending on control .  Will get ent to see  You about the left jaw pain.

## 2013-10-06 NOTE — Progress Notes (Signed)
Pre visit review using our clinic review tool, if applicable. No additional management support is needed unless otherwise documented below in the visit note.  Chief Complaint  Patient presents with  . Follow-up  . Diabetes  . Hyperlipidemia  . Hypertension    HPI: [name] [name] comesin  Over due for diseae management called in by "diabetecbundle"  Her last visit was almost a year ago she has been on clomipramine night Actos plus met on the [place]. Was out she's had some chaos in the household with the son moving back in with grandson stable at this time. She continues to work full-time. of on the [place] for about a month. Hasn't been checking her blood sugars because she can't find her new machine. State she is trying to do appropriate exercise in the right but is difficult sometimes with an inactive job and sweets at work. Denies any low blood sugars.  Hypertension  Ran out of blood pressure medicine and just hasn't picked it up for the last month has been good Diabetes Mellitus as above no vision changes numbness in feet has some tingling in the fourth and fifth finger at times. There is no weakness  Dyslipidemia off of the simvastatin thought it was causing the cough uncertain if had other side effects and to back on her medicine.  Obesity  The same  Work hard to resist   No recent. exercise.  New problem: Left jaw angle pain . This is been happening off and on over the last year or so soreness and left angle of the jaw and underneath without associated trauma sometimes is severe is getting more frequent no dental problems causing at not worse with swallowing and not related to TMJ area or otherwise your pain.  ROS: See pertinent positives and negatives per HPI. no chest pain shortness of breath syncope unusual depression. Some stress.   Past Medical History  Diagnosis Date  . Allergic rhinitis   . Diabetes mellitus   . Hyperlipidemia   . Hypertension   . IBS (irritable bowel  syndrome)   . Sciatica   . Normal nuclear stress test 1 /08    low risk study     Family History  Problem Relation Age of Onset  . Acute myelogenous leukemia Mother     died when pat age 16   . Diabetes Father   . Diabetes Paternal Grandfather     History   Social History  . Marital Status: Legally Separated    Spouse Name: N/A    Number of Children: N/A  . Years of Education: N/A   Social History Main Topics  . Smoking status: Never Smoker   . Smokeless tobacco: Never Used  . Alcohol Use: No  . Drug Use: No  . Sexual Activity: None   Other Topics Concern  . 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           Outpatient Encounter Prescriptions as of 10/06/2013  Medication Sig  . FREESTYLE UNISTICK II LANCETS MISC Use to check blood sugar 1-2 times daily  . glimepiride (AMARYL) 4 MG tablet Take 1  tablet (4 mg total) by mouth daily before breakfast.  . Glucose Blood (FREESTYLE TEST VI) by In Vitro route.    Marland Kitchen glycopyrrolate (ROBINUL) 2 MG tablet Take 1 tablet (2 mg total) by mouth 2 (two) times daily.  . Insulin Pen Needle (NOVOFINE PLUS) 32G X 4 MM MISC Use once daily with Victoza injection.  Marland Kitchen losartan (COZAAR) 50 MG tablet Take one tablet by mouth one time daily  . MULTIPLE VITAMIN PO Take by mouth.    . pioglitazone-metformin (ACTOPLUS MET) 15-850 MG per tablet TAKE ONE TABLET BY MOUTH TWICE DAILY WITH FOOD  . saxagliptin HCl (ONGLYZA) 5 MG TABS tablet Take one tablet by mouth one time daily  . [DISCONTINUED] FreeStyle Unistick II Lancets MISC by Does not apply route.    . [DISCONTINUED] Insulin Pen Needle (NOVOFINE PLUS) 32G X 4 MM MISC Use once daily with Victoza injection.  Marland Kitchen atorvastatin (LIPITOR) 20 MG tablet Take 1  tablet (20 mg total) by mouth daily.  Marland Kitchen glucose blood test strip Use to check blood sugar 1-2 times daily  . Liraglutide (VICTOZA) 18 MG/3ML SOPN Initial: 0.6 mg SUBQ every day for 1 week; Type 2 diabetes mellitus: (then increase to 1.2 mg SUBQ once daily;  . simvastatin (ZOCOR) 20 MG tablet TAKE ONE TABLET BY MOUTH ONE TIME DAILY  . [DISCONTINUED] predniSONE (DELTASONE) 10 MG tablet Take 4 tabs a day for 3 days, then 3 a day for 3 days, then 2 a day for 3 days, then 1 a day for 3 days, then stop    EXAM:  BP 138/80  Temp(Src) 98.1 F (36.7 C) (Oral)  Ht 5' 3.5" (1.613 m)  Wt 209 lb (94.802 kg)  BMI 36.44 kg/m2  Body mass index is 36.44 kg/(m^2).  GENERAL: vitals reviewed and listed above, alert, oriented, appears well hydrated and in no acute distress HEENT: atraumatic, conjunctiva  clear, no obvious abnormalities on inspection of external nose and ears OP : no lesion edema or exudate  tenderness is around the ankle of the left jaw around the parotid gland but I don't really feel a mass. Maybe slightly asymmetrical compared to the right. TMs are clear EACs clear  NECK: no obvious masses on inspection palpation  LUNGS: clear to auscultation bilaterally, no wheezes, rales or rhonchi, good air movement CV: HRRR, no clubbing cyanosis or  peripheral edema nl cap refill  MS: moves all extremities without noticeable focal  abnormality PSYCH: pleasant and cooperative, no obvious depression or anxiety diabertic foot exxam  Normal. Some nail thickeninf right great no ulcer Lab Results  Component Value Date   WBC 6.1 10/09/2011   HGB 13.0 10/09/2011   HCT 39.5 10/09/2011   PLT 272.0 10/09/2011   GLUCOSE 184* 09/15/2013   CHOL 209* 09/15/2013   TRIG 205.0* 09/15/2013   HDL 59.80 09/15/2013   LDLDIRECT 137.5 09/15/2013   LDLCALC 73 10/09/2011   ALT 26 10/09/2011   AST 23 10/09/2011   NA 137 09/15/2013   K 4.6 09/15/2013   CL 103 09/15/2013   CREATININE 0.8 09/15/2013   BUN 11 09/15/2013   CO2 24 09/15/2013   TSH  1.17 09/15/2013   HGBA1C 9.2* 09/15/2013   MICROALBUR 0.1 09/15/2013    ASSESSMENT AND PLAN:  Discussed the following assessment and plan:  Diabetes mellitus type 2, uncontrolled - uncontrolled disc optins dis referral can add basal insulin glp1 - Plan: POC Glucose (CBG)  Need for prophylactic vaccination and inoculation against influenza - Plan: Flu Vaccine  QUAD 36+ mos PF IM (Fluarix Quad PF)  Jaw pain, non-TMJ - Uncertain cause that could ENT exam because of persistent progressive symptoms - Plan: Ambulatory referral to ENT  Unspecified essential hypertension - Have been okay but ran out of blood pressure medicine  Other and unspecified hyperlipidemia - Start atorvastatin 20 mg has been off Cymbalta about a year  discussed and sample given victoza he to do 0.6 every day for a week and increase to 1.2 cut back on the sulfonylurea monitor blood sugars twice a day followup in one month unless doing well and can followup later  Injection training per MA post prandial bg after oatmeal today in 200 range  Consider endocrine referral and or basal insulin therapy. Close followup appropriate. -Patient advised to return or notify health care team  if symptoms worsen ,persist or new concerns arise.  Patient Instructions  Begin victoza injections  Each day.    .6 each am  After a week increase to 1.2 . Check BGs  Twice a day to make sure no low sugars. Can stop the amaryl ( or at least cut in 1/2)    To avoid low blood sugars  Initially will have some nausea.  And decrease appetite .  Begin atorvastatin for your cholesterol .  Get back on blood pressure med .  Plan labs lipid and hga1c in 3 months    OV or Send in readings of BG in 1 month depending on control .  Will get ent to see  You about the left jaw pain.     Standley Brooking. Panosh M.D.  Total visit 40 mins > 50% spent counseling and coordinating care

## 2013-10-20 ENCOUNTER — Other Ambulatory Visit: Payer: Self-pay | Admitting: Internal Medicine

## 2013-10-20 NOTE — Telephone Encounter (Signed)
Sent to the pharmacy by e-scribe. 

## 2013-10-20 NOTE — Telephone Encounter (Signed)
Refill x 6 months 

## 2013-12-12 ENCOUNTER — Encounter: Payer: Self-pay | Admitting: Internal Medicine

## 2013-12-29 ENCOUNTER — Other Ambulatory Visit: Payer: Self-pay | Admitting: Internal Medicine

## 2013-12-30 ENCOUNTER — Telehealth: Payer: Self-pay | Admitting: Family Medicine

## 2013-12-30 ENCOUNTER — Other Ambulatory Visit: Payer: Self-pay | Admitting: Family Medicine

## 2013-12-30 DIAGNOSIS — E1165 Type 2 diabetes mellitus with hyperglycemia: Secondary | ICD-10-CM

## 2013-12-30 DIAGNOSIS — E785 Hyperlipidemia, unspecified: Secondary | ICD-10-CM

## 2013-12-30 DIAGNOSIS — IMO0002 Reserved for concepts with insufficient information to code with codable children: Secondary | ICD-10-CM

## 2013-12-30 NOTE — Telephone Encounter (Signed)
Pt is now due for lab work and a follow up visit with Dr. Regis Bill.  I have placed the orders.  Please help the pt to get on both schedules. Please do lab work before follow up with [place].  Thanks!

## 2013-12-30 NOTE — Telephone Encounter (Signed)
Sent to the pharmacy by e-scribe.  Sent a telephone note to scheduling to have lab work and follow up visit.

## 2014-01-03 NOTE — Telephone Encounter (Signed)
[name] pt decline to sch lab etc at this time. Pt would like you to call her back at work number

## 2014-01-09 MED ORDER — SAXAGLIPTIN HCL 5 MG PO TABS: ORAL_TABLET | ORAL | Status: DC

## 2014-01-09 NOTE — Telephone Encounter (Signed)
Spoke to the pt.  She has had some unexpected bills and is unable to schedule appointments at this time.  Stated she will call back for Feb. 2016 appointments. Needed a refill on her Onglyza.  Will send in enough to last until Feb.  Will send in a reminder for both appointments.

## 2014-07-28 ENCOUNTER — Ambulatory Visit: Payer: Self-pay | Admitting: Family Medicine

## 2014-07-28 ENCOUNTER — Ambulatory Visit (INDEPENDENT_AMBULATORY_CARE_PROVIDER_SITE_OTHER): Payer: 59 | Admitting: Internal Medicine

## 2014-07-28 ENCOUNTER — Other Ambulatory Visit: Payer: Self-pay | Admitting: Family Medicine

## 2014-07-28 ENCOUNTER — Ambulatory Visit (INDEPENDENT_AMBULATORY_CARE_PROVIDER_SITE_OTHER)
Admission: RE | Admit: 2014-07-28 | Discharge: 2014-07-28 | Disposition: A | Discharge status: Home / Self Care | Payer: 59 | Source: Ambulatory Visit | Attending: Internal Medicine | Admitting: Internal Medicine

## 2014-07-28 ENCOUNTER — Encounter: Payer: Self-pay | Admitting: Internal Medicine

## 2014-07-28 VITALS — BP 144/84 | Temp 98.2°F | Ht 63.5 in | Wt 198.2 lb

## 2014-07-28 DIAGNOSIS — R221 Localized swelling, mass and lump, neck: Secondary | ICD-10-CM

## 2014-07-28 DIAGNOSIS — E119 Type 2 diabetes mellitus without complications: Secondary | ICD-10-CM | POA: Diagnosis not present

## 2014-07-28 DIAGNOSIS — I10 Essential (primary) hypertension: Secondary | ICD-10-CM

## 2014-07-28 DIAGNOSIS — E785 Hyperlipidemia, unspecified: Secondary | ICD-10-CM

## 2014-07-28 DIAGNOSIS — M25511 Pain in right shoulder: Secondary | ICD-10-CM

## 2014-07-28 DIAGNOSIS — IMO0002 Reserved for concepts with insufficient information to code with codable children: Secondary | ICD-10-CM

## 2014-07-28 DIAGNOSIS — E1165 Type 2 diabetes mellitus with hyperglycemia: Secondary | ICD-10-CM

## 2014-07-28 DIAGNOSIS — M25519 Pain in unspecified shoulder: Secondary | ICD-10-CM | POA: Insufficient documentation

## 2014-07-28 MED ORDER — MELOXICAM 15 MG PO TABS: 15.0000 mg | ORAL_TABLET | Freq: Every day | ORAL | Status: DC

## 2014-07-28 NOTE — Progress Notes (Signed)
Pre visit review using our clinic review tool, if applicable. No additional management support is needed unless otherwise documented below in the visit note.   Chief Complaint  Patient presents with  . "KNOT" in neck    X1week.  Also has pain in her shoulder and at her shoulder blade.  Unsure if the two are connected.    HPI: Patient [name] [name]  comes in today for SDA for  new problem evaluation. Onset about a week of increasing right shoulder pain worse at night when she lays on it without associated trauma although has had some repetitive motion. No associated fever has used 1-2 ibuprofen 1-2 times a day without a lot of help. She then began to notice some swelling in the suprasternal notch of her neck that is now larger and tender no dysphasia although some pain on the right side with swallowing has never had this before no shortness of breath. No coughing or fever. ROS: See pertinent positives and negatives per HPI. She is overdue for her diabetes and metabolic checks so has an appointment for lab work and follow-up next week. She hasn't checked her blood sugar in quite a while. States she has a sore spot on the base of her left foot question heel spur no trauma bothers her she walks long distances otherwise okay. Has seen Dr. Derry Lory in sports medicine before  Past Medical History  Diagnosis Date  . Allergic rhinitis   . Diabetes mellitus   . Hyperlipidemia   . Hypertension   . IBS (irritable bowel syndrome)   . Sciatica   . Normal nuclear stress test 1 /08    low risk study     Family History  Problem Relation Age of Onset  . Acute myelogenous leukemia Mother     died when pat age 27   . Diabetes Father   . Diabetes Paternal Grandfather     History   Social History  . Marital Status: Legally Separated    Spouse Name: N/A  . Number of Children: N/A  . Years of Education: N/A   Social History Main Topics  . Smoking status: Never Smoker   . Smokeless tobacco: Never  Used  . Alcohol Use: No  . Drug Use: No  . Sexual Activity: Not on file   Other Topics Concern  . 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           Outpatient Prescriptions Prior to Visit  Medication Sig Dispense Refill  . atorvastatin (LIPITOR) 20 MG tablet Take 1 tablet (20 mg total) by mouth daily. 90 tablet 3  . FREESTYLE UNISTICK II LANCETS MISC Use to check blood sugar 1-2 times daily 100 each 3  . glimepiride (AMARYL) 4 MG tablet Take 1 tablet (4 mg total) by mouth daily before breakfast. 90 tablet 3  . Glucose Blood (FREESTYLE TEST VI) by In Vitro route.      Marland Kitchen glucose blood test strip Use to check blood sugar 1-2 times daily 100 each 3  . Insulin Pen Needle (NOVOFINE PLUS) 32G X 4 MM MISC Use once daily with Victoza injection.  100 each 3  . losartan (COZAAR) 50 MG tablet Take one tablet by mouth one time daily 30 tablet 5  . ONGLYZA 5 MG TABS tablet TAKE ONE TABLET BY MOUTH ONE TIME DAILY  (Patient taking differently: TAKE ONE TABLET BY MOUTH ONE TIME DAILY) 30 tablet 1  . pioglitazone-metformin (ACTOPLUS MET) 15-850 MG per tablet TAKE ONE TABLET BY MOUTH TWICE DAILY WITH FOOD  60 tablet 5  . glycopyrrolate (ROBINUL) 2 MG tablet Take 1 tablet (2 mg total) by mouth 2 (two) times daily. (Patient not taking: Reported on 07/28/2014) 60 tablet 1  . JARDIANCE 10 MG TABS TAKE ONE TABLET BY MOUTH ONE TIME DAILY  (Patient not taking: Reported on 07/28/2014) 30 tablet 5  . Liraglutide (VICTOZA) 18 MG/3ML SOPN Initial: 0.6 mg SUBQ every day for 1 week; Type 2 diabetes mellitus: (then increase to 1.2 mg SUBQ once daily; (Patient not taking: Reported on 07/28/2014) 15 mL prn  . MULTIPLE VITAMIN PO Take by mouth.      . simvastatin (ZOCOR) 20 MG tablet  TAKE ONE TABLET BY MOUTH ONE TIME DAILY (Patient not taking: Reported on 07/28/2014) 30 tablet 6  . saxagliptin HCl (ONGLYZA) 5 MG TABS tablet Take one tablet by mouth one time daily 30 tablet 2   No facility-administered medications prior to visit.     EXAM:  BP 144/84 mmHg  Temp(Src) 98.2 F (36.8 C) (Oral)  Ht 5' 3.5" (1.613 m)  Wt 198 lb 3.2 oz (89.903 kg)  BMI 34.55 kg/m2  Body mass index is 34.55 kg/(m^2).  GENERAL: vitals reviewed and listed above, alert, oriented, appears well hydrated and in no acute distress with some obvious rounded swelling in the suprasternal notch. HEENT: atraumatic, conjunctiva  clear, no obvious abnormalities on inspection of external nose and ears OP : no lesion edema or exudate tongue is midline. NECK:  Supple without bony tenderness no bruit there is a 3 to force recently meter soft round superficial nodule in the supra sternal notch. It doesn't appear to be attached to the thyroid. Overrides  the sternum and the clavicular sternal joints LUNGS: clear to auscultation bilaterally, no wheezes, rales or rhonchi, good air movement CV: HRRR, no clubbing cyanosis or  peripheral edema nl cap refill  Abdomen soft without organomegaly guarding or rebound. MS: moves all extremities right shoulder decreased elevation range of motion because of discomfort but no acute swelling. No obvious neurologic deficit. PSYCH: pleasant and cooperative, no obvious depression or anxiety  ASSESSMENT AND PLAN:  Discussed the following assessment and plan:  Lump in neck - see tst. - Plan: DG Chest 2 View, DG Cervical Spine Complete, DG Shoulder Right, Basic metabolic panel, CBC with Differential/Platelet, Hepatic function panel, TSH  Pain in shoulder region, right - Plan: DG Chest 2 View, DG Cervical Spine Complete, DG Shoulder Right, Basic metabolic panel, CBC with Differential/Platelet, Hepatic function panel, TSH  Type 2 diabetes mellitus without complication - lack of fu  unknown status  - Plan: Basic metabolic panel, CBC with Differential/Platelet, Hepatic function panel, TSH  Essential hypertension - Plan: Basic metabolic panel, CBC with Differential/Platelet, Hepatic function panel, TSH  Type II diabetes mellitus, uncontrolled - Plan: Hemoglobin A1c  Hyperlipidemia - Plan: Lipid panel Uncertain cause of lump feels like a lipoma but in an odd place and now it is tender and she has right shoulder pain. I don't think it is thyroid but I guess that is possible check chest x-ray shoulder x-rayed neck x-ray. May need imaging  study of the neck and/or ENT such as CT scan. Plan full set of laboratory tests within the next week before follow-up make a decision about further plans. Stop the ibuprofen try Mobitz once a day for 7-10 days. She couldn't take naproxen because it made her feel bad but it wasn't an allergy. Caution with anti-inflammatory because of her diabetes diagnosis. -Patient advised to return or notify health care team  if symptoms worsen ,persist or new concerns arise.  Patient Instructions  Uncertain cause of the lump poss cyst  Or lipoma   Plan x ray chest  Shoulder  Neck  May need ct scan of neck . Get full set of labs cbc diff   Bmp lipid lfts  hga1c and tsh    After x ray and lab will decide on next step . Can use  anitinflammatory  In the interim    Heel spurt see dr Derry Lory if  persistent or progressive and affecting your  Daily living.    Standley Brooking. Panosh M.D.

## 2014-07-28 NOTE — Patient Instructions (Addendum)
Uncertain cause of the lump poss cyst  Or lipoma   Plan x ray chest  Shoulder  Neck  May need ct scan of neck . Get full set of labs cbc diff   Bmp lipid lfts  hga1c and tsh    After x ray and lab will decide on next step . Can use  anitinflammatory  In the interim    Heel spur see dr Derry Lory if  persistent or progressive and affecting your  Daily living.

## 2014-08-01 ENCOUNTER — Other Ambulatory Visit: Payer: 59

## 2014-08-01 LAB — BASIC METABOLIC PANEL
BUN: 12 mg/dL (ref 6–23)
CALCIUM: 9.2 mg/dL (ref 8.4–10.5)
CHLORIDE: 100 meq/L (ref 96–112)
CO2: 27 meq/L (ref 19–32)
Creatinine, Ser: 0.62 mg/dL (ref 0.40–1.20)
GFR: 103.6 mL/min (ref >60.00)
Glucose, Bld: 259 mg/dL — ABNORMAL HIGH (ref 70–99)
POTASSIUM: 4.6 meq/L (ref 3.5–5.1)
Sodium: 134 mEq/L — ABNORMAL LOW (ref 135–145)

## 2014-08-01 LAB — LIPID PANEL
Cholesterol: 236 mg/dL — ABNORMAL HIGH (ref 0–200)
HDL: 56.1 mg/dL (ref >39.00)
NonHDL: 179.9
Total CHOL/HDL Ratio: 4
Triglycerides: 262 mg/dL — ABNORMAL HIGH (ref 0.0–149.0)
VLDL: 52.4 mg/dL — AB (ref 0.0–40.0)

## 2014-08-01 LAB — CBC WITH DIFFERENTIAL/PLATELET
Basophils Absolute: 0.1 10*3/uL (ref 0.0–0.1)
Basophils Relative: 0.9 % (ref 0.0–3.0)
Eosinophils Absolute: 0.2 10*3/uL (ref 0.0–0.7)
Eosinophils Relative: 2.8 % (ref 0.0–5.0)
HCT: 43.1 % (ref 36.0–46.0)
Hemoglobin: 14.5 g/dL (ref 12.0–15.0)
LYMPHS PCT: 33.9 % (ref 12.0–46.0)
Lymphs Abs: 2.3 10*3/uL (ref 0.7–4.0)
MCHC: 33.7 g/dL (ref 30.0–36.0)
MCV: 88.7 fl (ref 78.0–100.0)
Monocytes Absolute: 0.4 10*3/uL (ref 0.1–1.0)
Monocytes Relative: 6.1 % (ref 3.0–12.0)
NEUTROS PCT: 56.3 % (ref 43.0–77.0)
Neutro Abs: 3.8 10*3/uL (ref 1.4–7.7)
PLATELETS: 320 10*3/uL (ref 150.0–400.0)
RBC: 4.86 Mil/uL (ref 3.87–5.11)
RDW: 13.1 % (ref 11.5–15.5)
WBC: 6.7 10*3/uL (ref 4.0–10.5)

## 2014-08-01 LAB — HEPATIC FUNCTION PANEL
ALBUMIN: 3.6 g/dL (ref 3.5–5.2)
ALT: 15 U/L (ref 0–35)
AST: 16 U/L (ref 0–37)
Alkaline Phosphatase: 87 U/L (ref 39–117)
Bilirubin, Direct: 0.1 mg/dL (ref 0.0–0.3)
Total Bilirubin: 0.6 mg/dL (ref 0.2–1.2)
Total Protein: 6.6 g/dL (ref 6.0–8.3)

## 2014-08-01 LAB — TSH: TSH: 1.33 u[IU]/mL (ref 0.35–4.50)

## 2014-08-01 LAB — LDL CHOLESTEROL, DIRECT: Direct LDL: 134 mg/dL

## 2014-08-01 LAB — HEMOGLOBIN A1C: Hgb A1c MFr Bld: 10.1 % — ABNORMAL HIGH (ref 4.6–6.5)

## 2014-08-04 ENCOUNTER — Ambulatory Visit: Payer: Self-pay | Admitting: Internal Medicine

## 2014-08-04 ENCOUNTER — Other Ambulatory Visit: Payer: Self-pay | Admitting: Family Medicine

## 2014-08-04 DIAGNOSIS — M25511 Pain in right shoulder: Secondary | ICD-10-CM

## 2014-08-04 DIAGNOSIS — R221 Localized swelling, mass and lump, neck: Secondary | ICD-10-CM

## 2014-08-07 ENCOUNTER — Encounter: Payer: Self-pay | Admitting: Internal Medicine

## 2014-08-07 ENCOUNTER — Ambulatory Visit (INDEPENDENT_AMBULATORY_CARE_PROVIDER_SITE_OTHER): Payer: 59 | Admitting: Internal Medicine

## 2014-08-07 VITALS — BP 136/96 | Temp 98.5°F | Ht 63.5 in | Wt 198.1 lb

## 2014-08-07 DIAGNOSIS — Z91120 Patient's intentional underdosing of medication regimen due to financial hardship: Secondary | ICD-10-CM

## 2014-08-07 DIAGNOSIS — E785 Hyperlipidemia, unspecified: Secondary | ICD-10-CM

## 2014-08-07 DIAGNOSIS — IMO0002 Reserved for concepts with insufficient information to code with codable children: Secondary | ICD-10-CM

## 2014-08-07 DIAGNOSIS — E1165 Type 2 diabetes mellitus with hyperglycemia: Secondary | ICD-10-CM

## 2014-08-07 DIAGNOSIS — I10 Essential (primary) hypertension: Secondary | ICD-10-CM

## 2014-08-07 DIAGNOSIS — R221 Localized swelling, mass and lump, neck: Secondary | ICD-10-CM

## 2014-08-07 DIAGNOSIS — Z9119 Patient's noncompliance with other medical treatment and regimen: Secondary | ICD-10-CM

## 2014-08-07 DIAGNOSIS — Z91199 Patient's noncompliance with other medical treatment and regimen due to unspecified reason: Secondary | ICD-10-CM

## 2014-08-07 MED ORDER — ALPRAZOLAM 0.5 MG PO TABS: 0.5000 mg | ORAL_TABLET | Freq: Two times a day (BID) | ORAL | Status: DC | PRN

## 2014-08-07 MED ORDER — SAXAGLIPTIN HCL 5 MG PO TABS: 5.0000 mg | ORAL_TABLET | Freq: Every day | ORAL | Status: DC

## 2014-08-07 NOTE — Progress Notes (Signed)
Pre visit review using our clinic review tool, if applicable. No additional management support is needed unless otherwise documented below in the visit note.  Chief Complaint  Patient presents with  . Follow-up    HPI: [name] [name] 64 y.o. here for fu of multiple issues  Lump in neck slightly less tender more laterally to the right. No fevers. Has appointment for CT scan of neck and chest tomorrow asked for anxiety pill history of panic with procedure something like Xanax. History of Valium lasting too long.  Dm labs not checking and overdue for monitoring  hasn't taken Actos plus met for a week or 2 ran out of Onglyza days ago is taking [name] 9. Took the ketosis samples and then forgot a prescription filled never took the [place] aunts. She is also not taking atorvastatin because she hasn't been able to afford her medicines yet. Is trying to get her mortgage paid first although think should get better in the next couple months. Denies any low blood sugars. Denies any numbness in her feet or major changes in her vision. She is not using any co-pay cards.  She is going out of town next week as for samples. ROS: See pertinent positives and negatives per HPI.  Past Medical History  Diagnosis Date  . Allergic rhinitis   . Diabetes mellitus   . Hyperlipidemia   . Hypertension   . IBS (irritable bowel syndrome)   . Sciatica   . Normal nuclear stress test 1 /08    low risk study     Family History  Problem Relation Age of Onset  . Acute myelogenous leukemia Mother     died when pat age 25   . Diabetes Father   . Diabetes Paternal Grandfather     History   Social History  . Marital Status: Legally Separated    Spouse Name: N/A  . Number of Children: N/A  . Years of Education: N/A   Social History Main Topics  . Smoking status: Never Smoker   . Smokeless tobacco: Never Used  . Alcohol Use: No  . Drug Use: No  . Sexual Activity: Not on file   Other Topics  Concern  . 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           Outpatient Prescriptions Prior to Visit  Medication Sig Dispense Refill  . FREESTYLE UNISTICK II LANCETS MISC Use to check blood sugar 1-2 times daily 100 each 3  . glimepiride (AMARYL) 4 MG tablet Take 1 tablet (4 mg total) by mouth daily before breakfast. 90 tablet 3  . Glucose Blood (FREESTYLE TEST VI) by In Vitro route.      Marland Kitchen glucose blood test strip Use to check blood sugar 1-2 times daily 100 each 3  . Insulin Pen Needle (NOVOFINE PLUS) 32G X 4 MM MISC Use once daily with Victoza injection. 100 each 3  . losartan (COZAAR) 50 MG tablet Take one tablet by mouth one time daily 30 tablet 5  . meloxicam (MOBIC) 15 MG tablet Take 1 tablet (15 mg total) by mouth daily. For 7-10 days and  then as needed 30 tablet 0  . pioglitazone-metformin (ACTOPLUS MET) 15-850 MG per tablet TAKE ONE TABLET BY MOUTH TWICE DAILY WITH FOOD  60 tablet 5  . ONGLYZA 5 MG TABS tablet TAKE ONE TABLET BY MOUTH ONE TIME DAILY  (Patient taking differently: TAKE ONE TABLET BY MOUTH ONE TIME DAILY) 30 tablet 1  . atorvastatin (LIPITOR) 20 MG tablet Take 1 tablet (20 mg total) by mouth daily. (Patient not taking: Reported on 08/07/2014) 90 tablet 3  . glycopyrrolate (ROBINUL) 2 MG tablet Take 1 tablet (2 mg total) by mouth 2 (two) times daily. (Patient not taking: Reported on 07/28/2014) 60 tablet 1  . JARDIANCE 10 MG TABS TAKE ONE TABLET BY MOUTH ONE TIME DAILY  (Patient not taking: Reported on 07/28/2014) 30 tablet 5  . Liraglutide (VICTOZA) 18 MG/3ML SOPN Initial: 0.6 mg SUBQ every day for 1 week; Type 2 diabetes mellitus: (then increase to 1.2 mg SUBQ once daily; (Patient not taking: Reported on 07/28/2014) 15 mL prn  .  MULTIPLE VITAMIN PO Take by mouth.      . simvastatin (ZOCOR) 20 MG tablet TAKE ONE TABLET BY MOUTH ONE TIME DAILY (Patient not taking: Reported on 07/28/2014) 30 tablet 6   No facility-administered medications prior to visit.     EXAM:  BP 136/96 mmHg  Temp(Src) 98.5 F (36.9 C) (Oral)  Ht 5' 3.5" (1.613 m)  Wt 198 lb 1.6 oz (89.858 kg)  BMI 34.54 kg/m2  Body mass index is 34.54 kg/(m^2).  GENERAL: vitals reviewed and listed above, alert, oriented, appears well hydrated and in no acute distress HEENT: atraumatic, conjunctiva  clear, no obvious abnormalities on inspection of external nose and ears OP : no lesion edema or exudate  NECK:  Fullness in the suprasternal notch is somewhat improved prominent right clavicle area. LUNGS: clear to auscultation bilaterally, no wheezes, rales or rhonchi,  CV: HRRR, no clubbing cyanosis or  peripheral edema nl cap refill  MS: moves all extremities without noticeable focal  Abnormality Diabetic Foot Exam - Simple   Simple Foot Form  Diabetic Foot exam was performed with the following findings:  Yes 08/07/2014  5:13 PM  Visual Inspection  No deformities, no ulcerations, no other skin breakdown bilaterally:  Yes  Sensation Testing  Intact to touch and monofilament testing bilaterally:  Yes  Pulse Check  Posterior Tibialis and Dorsalis pulse intact bilaterally:  Yes  Comments     PSYCH: pleasant and cooperative, no obvious depression or anxiety Lab Results  Component Value Date   WBC 6.7 08/01/2014   HGB 14.5 08/01/2014   HCT 43.1 08/01/2014   PLT 320.0 08/01/2014   GLUCOSE 259* 08/01/2014   CHOL 236* 08/01/2014   TRIG 262.0* 08/01/2014   HDL 56.10 08/01/2014   LDLDIRECT 134.0 08/01/2014   LDLCALC 73 10/09/2011   ALT 15 08/01/2014   AST 16 08/01/2014   NA 134* 08/01/2014   K 4.6 08/01/2014   CL 100 08/01/2014   CREATININE 0.62 08/01/2014   BUN 12 08/01/2014   CO2 27 08/01/2014   TSH 1.33 08/01/2014   HGBA1C 10.1* 08/01/2014    MICROALBUR 0.1 09/15/2013   BP Readings from Last 3 Encounters:  08/07/14 136/96  07/28/14 144/84  10/06/13 138/80   Diabetes Health Maintenance Due  Topic Date Due  . OPHTHALMOLOGY EXAM  07/11/2008  . URINE MICROALBUMIN  09/16/2014  . FOOT EXAM  10/07/2014  . HEMOGLOBIN A1C  01/31/2015      ASSESSMENT AND PLAN:  Discussed  the following assessment and plan:  Type II diabetes mellitus, uncontrolled  Lump in neck - Scan for tomorrow prescription for for alprazolam if needed preprocedure  Diabetes mellitus type 2, uncontrolled  Intentional underdosing of medication regimen by patient due to financial hardship  NON ADHERENCE  TO MEDICATION  Essential hypertension  Hyperlipidemia - Currently not on medication cost hasn't started yet Overall her diabetes is been put aside by her she hasn't been checking hasn't been able to take regular medicines except the inexpensive ones on a regular basis. Which is Madelynn Done per my. Have her look into generic metformin and generic Actos cost and we can rewrite it. Onglyza is not a very robust medication although to a co-pay card for given to her today. I rather her be on either Victoza or truly [place] depending on her insurance plan if her thyroid is okay on the scan. There are co-pay cards for this and she may be able to afford this drop other meds. Continue her blood pressure medicine may have to adjust the dose up. Advise we get the scan first and then make plan of action about the diabetes and she should look into all these costs.  First not to be referred to endocrinology at this point because rather tried to get back on medication and see if it controls. -Patient advised to return or notify health care team  if symptoms worsen ,persist or new concerns arise. We no longer give samples except for injectable but did review for co-pay cards. Patient Instructions  Consider going back on victoza or trulicity if thyroid is ok on the scan .  These  can  work quite well May be less expensive to do metformin and actos as separated rx   Ask pharmacist .   I  can rewrite it .  Can [place] alprazolam pre scan if needed.  Your diabetes is out of control .   Ask your insurance which machine and strips are covered .      Standley Brooking. Panosh M.D.

## 2014-08-07 NOTE — Patient Instructions (Signed)
Consider going back on victoza or trulicity if thyroid is ok on the scan .  These  can work quite well May be less expensive to do metformin and actos as separated rx   Ask pharmacist .   I  can rewrite it .  Can [place] alprazolam pre scan if needed.  Your diabetes is out of control .   Ask your insurance which machine and strips are covered .

## 2014-08-08 ENCOUNTER — Ambulatory Visit (INDEPENDENT_AMBULATORY_CARE_PROVIDER_SITE_OTHER)
Admission: RE | Admit: 2014-08-08 | Discharge: 2014-08-08 | Disposition: A | Discharge status: Home / Self Care | Payer: 59 | Source: Ambulatory Visit | Attending: Internal Medicine | Admitting: Internal Medicine

## 2014-08-08 DIAGNOSIS — R221 Localized swelling, mass and lump, neck: Secondary | ICD-10-CM

## 2014-08-08 DIAGNOSIS — M25511 Pain in right shoulder: Secondary | ICD-10-CM

## 2014-08-08 MED ORDER — IOHEXOL 300 MG/ML  SOLN
80.0000 mL | Freq: Once | INTRAMUSCULAR | Status: AC | PRN
  Administered 2014-08-08: 80 mL via INTRAVENOUS

## 2014-12-04 ENCOUNTER — Ambulatory Visit (INDEPENDENT_AMBULATORY_CARE_PROVIDER_SITE_OTHER): Payer: 59 | Admitting: Internal Medicine

## 2014-12-04 ENCOUNTER — Encounter: Payer: Self-pay | Admitting: Internal Medicine

## 2014-12-04 VITALS — BP 126/80 | Temp 98.1°F | Wt 190.6 lb

## 2014-12-04 DIAGNOSIS — R300 Dysuria: Secondary | ICD-10-CM | POA: Diagnosis not present

## 2014-12-04 DIAGNOSIS — E1165 Type 2 diabetes mellitus with hyperglycemia: Secondary | ICD-10-CM | POA: Diagnosis not present

## 2014-12-04 DIAGNOSIS — I10 Essential (primary) hypertension: Secondary | ICD-10-CM

## 2014-12-04 DIAGNOSIS — E785 Hyperlipidemia, unspecified: Secondary | ICD-10-CM

## 2014-12-04 DIAGNOSIS — IMO0001 Reserved for inherently not codable concepts without codable children: Secondary | ICD-10-CM

## 2014-12-04 LAB — POCT URINALYSIS DIPSTICK
Bilirubin, UA: NEGATIVE
NITRITE UA: NEGATIVE
PH UA: 5
Spec Grav, UA: 1.02
Urobilinogen, UA: 0.2

## 2014-12-04 MED ORDER — METFORMIN HCL 850 MG PO TABS: 850.0000 mg | ORAL_TABLET | Freq: Two times a day (BID) | ORAL | Status: DC

## 2014-12-04 MED ORDER — LIRAGLUTIDE 18 MG/3ML ~~LOC~~ SOPN: PEN_INJECTOR | SUBCUTANEOUS | Status: DC

## 2014-12-04 MED ORDER — PIOGLITAZONE HCL 15 MG PO TABS: 15.0000 mg | ORAL_TABLET | Freq: Every day | ORAL | Status: DC

## 2014-12-04 MED ORDER — LOSARTAN POTASSIUM 50 MG PO TABS: 50.0000 mg | ORAL_TABLET | Freq: Every day | ORAL | Status: DC

## 2014-12-04 MED ORDER — NITROFURANTOIN MONOHYD MACRO 100 MG PO CAPS: 100.0000 mg | ORAL_CAPSULE | Freq: Two times a day (BID) | ORAL | Status: DC

## 2014-12-04 NOTE — Patient Instructions (Addendum)
Treat  uti  Pending culture test.   If  Sx persist then let us know.  Can send in victoza injection to be injected once a day. Marland Kitchen0.6 each day for 1 weeks and then 1.2 each day   . If too nauseous then can stay at lower dose longer.  Check   Blood sugars  1-2 x per day  ROV in 6-8 weeks    To review  And then plan  Labs at that time.   Urinary Tract Infection Urinary tract infections (UTIs) can develop anywhere along your urinary tract. Your urinary tract is your body's drainage system for removing wastes and extra water. Your urinary tract includes two kidneys, two ureters, a bladder, and a urethra. Your kidneys are a pair of bean-shaped organs. Each kidney is about the size of your fist. They are located below your ribs, one on each side of your spine. CAUSES Infections are caused by microbes, which are microscopic organisms, including fungi, viruses, and bacteria. These organisms are so small that they can only be seen through a microscope. Bacteria are the microbes that most commonly cause UTIs. SYMPTOMS  Symptoms of UTIs may vary by age and gender of the patient and by the location of the infection. Symptoms in young women typically include a frequent and intense urge to urinate and a painful, burning feeling in the bladder or urethra during urination. Older women and men are more likely to be tired, shaky, and weak and have muscle aches and abdominal pain. A fever may mean the infection is in your kidneys. Other symptoms of a kidney infection include pain in your back or sides below the ribs, nausea, and vomiting. DIAGNOSIS To diagnose a UTI, your caregiver will ask you about your symptoms. Your caregiver will also ask you to provide a urine sample. The urine sample will be tested for bacteria and white blood cells. White blood cells are made by your body to help fight infection. TREATMENT  Typically, UTIs can be treated with medication. Because most UTIs are caused by a bacterial infection,  they usually can be treated with the use of antibiotics. The choice of antibiotic and length of treatment depend on your symptoms and the type of bacteria causing your infection. HOME CARE INSTRUCTIONS  If you were prescribed antibiotics, take them exactly as your caregiver instructs you. Finish the medication even if you feel better after you have only taken some of the medication.  Drink enough water and fluids to keep your urine clear or pale yellow.  Avoid caffeine, tea, and carbonated beverages. They tend to irritate your bladder.  Empty your bladder often. Avoid holding urine for long periods of time.  Empty your bladder before and after sexual intercourse.  After a bowel movement, women should cleanse from front to back. Use each tissue only once. SEEK MEDICAL CARE IF:   You have back pain.  You develop a fever.  Your symptoms do not begin to resolve within 3 days. SEEK IMMEDIATE MEDICAL CARE IF:   You have severe back pain or lower abdominal pain.  You develop chills.  You have nausea or vomiting.  You have continued burning or discomfort with urination. MAKE SURE YOU:   Understand these instructions.  Will watch your condition.  Will get help right away if you are not doing well or get worse.   This information is not intended to replace advice given to you by your health care provider. Make sure you discuss any questions you  have with your health care provider.   Document Released: 11/06/2004 Document Revised: 10/18/2014 Document Reviewed: 03/07/2011 Elsevier Interactive Patient Education [employer].

## 2014-12-04 NOTE — Progress Notes (Signed)
Pre visit review using our clinic review tool, if applicable. No additional management support is needed unless otherwise documented below in the visit note.  Chief Complaint  Patient presents with  . Dysuria  . Urinary Frequency    med for diabetes     HPI: Patient [name] [name]  comes in today for SDA for  new problem evaluation.  Dysuria and uti sx without flank  Pain for fever  For days  No rx .    Also  wants to try now to get victoza insuance will pay  hasnt been monitoring bg since last visit and no fu at that time .  Would like to get on med and then check labs  No falling numbness  ROS: See pertinent positives and negatives per HPI. No cp sob syncope fever chills or hematuria   Past Medical History  Diagnosis Date  . Allergic rhinitis   . Diabetes mellitus   . Hyperlipidemia   . Hypertension   . IBS (irritable bowel syndrome)   . Sciatica   . Normal nuclear stress test 1 /08    low risk study     Family History  Problem Relation Age of Onset  . Acute myelogenous leukemia Mother     died when pat age 21   . Diabetes Father   . Diabetes Paternal Grandfather     Social History   Social History  . Marital Status: Legally Separated    Spouse Name: N/A  . Number of Children: N/A  . Years of Education: N/A   Social History Main Topics  . Smoking status: Never Smoker   . Smokeless tobacco: Never Used  . Alcohol Use: No  . Drug Use: No  . Sexual Activity: Not Asked   Other Topics Concern  . 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           Outpatient Prescriptions Prior to Visit  Medication Sig Dispense Refill  . FREESTYLE UNISTICK II LANCETS MISC Use to check blood sugar 1-2 times daily 100 each  3  . glimepiride (AMARYL) 4 MG tablet Take 1 tablet (4 mg total) by mouth daily before breakfast. 90 tablet 3  . Glucose Blood (FREESTYLE TEST VI) by In Vitro route.      Marland Kitchen glucose blood test strip Use to check blood sugar 1-2 times daily 100 each 3  . Insulin Pen Needle (NOVOFINE PLUS) 32G X 4 MM MISC Use once daily with Victoza injection. 100 each 3  . saxagliptin HCl (ONGLYZA) 5 MG TABS tablet Take 1 tablet (5 mg total) by mouth daily. 30 tablet 6  . losartan (COZAAR) 50 MG tablet Take one tablet by mouth one time daily 30 tablet 5  . pioglitazone-metformin (ACTOPLUS MET) 15-850 MG per tablet TAKE ONE TABLET BY MOUTH TWICE DAILY WITH FOOD  60 tablet 5  . atorvastatin (LIPITOR) 20 MG tablet Take 1 tablet (20 mg total) by mouth daily. (Patient not taking: Reported on 08/07/2014) 90 tablet 3  . ALPRAZolam (XANAX) 0.5 MG tablet Take 1 tablet (0.5 mg total) by mouth 2 (two) times daily as needed for  anxiety (procedure). 4 tablet 0  . glycopyrrolate (ROBINUL) 2 MG tablet Take 1 tablet (2 mg total) by mouth 2 (two) times daily. (Patient not taking: Reported on 07/28/2014) 60 tablet 1  . JARDIANCE 10 MG TABS TAKE ONE TABLET BY MOUTH ONE TIME DAILY  (Patient not taking: Reported on 07/28/2014) 30 tablet 5  . Liraglutide (VICTOZA) 18 MG/3ML SOPN Initial: 0.6 mg SUBQ every day for 1 week; Type 2 diabetes mellitus: (then increase to 1.2 mg SUBQ once daily; (Patient not taking: Reported on 07/28/2014) 15 mL prn  . meloxicam (MOBIC) 15 MG tablet Take 1 tablet (15 mg total) by mouth daily. For 7-10 days and then as needed 30 tablet 0  . MULTIPLE VITAMIN PO Take by mouth.       No facility-administered medications prior to visit.     EXAM:  BP 126/80 mmHg  Temp(Src) 98.1 F (36.7 C) (Oral)  Wt 190 lb 9.6 oz (86.456 kg)  Body mass index is 33.23 kg/(m^2).  GENERAL: vitals reviewed and listed above, alert, oriented, appears well hydrated and in no acute distress HEENT: atraumatic, conjunctiva  clear, no  obvious abnormalities on inspection of external nose and ears NECK: no obvious masses on inspection palpation  CV: HRRR, no clubbing cyanosis or  peripheral edema nl cap refill  Abdomen:  Sof,t normal bowel sounds without hepatosplenomegaly, no guarding rebound or masses no CVA tenderness MS: moves all extremities without noticeable focal  abnormality PSYCH: pleasant and cooperative, no obvious depression or anxiety  ASSESSMENT AND PLAN:  Discussed the following assessment and plan:  Dysuria - prob acute uti - Plan: POC Urinalysis Dipstick, Culture, Urine  Uncontrolled type 2 diabetes mellitus without complication, without long-term current use of insulin (HCC) - sub optimal Fu  not as advised  says waiting on ability to pay for new pmeds   Essential hypertension  Hyperlipidemia Pt comes in  for acute visit but  We discussed Chronic disease management and plan  encouraged her to fu delaying  Some labs as her request to be able to initiate  Intervention    Risk benefit of medication discussed. No fam hx of thyroid c cell cancer  Counseled. Total visit 10mns > 50% spent counseling and coordinating care as indicated in above note and in instructions to patient .  -Patient advised to return or notify health care team  if symptoms worsen ,persist or new concerns arise.  Patient Instructions  Treat  uti  Pending culture test.   If  Sx persist then let [place].  Can send in victoza injection to be injected once a day. .Marland Kitchen.6 each day for 1 weeks and then 1.2 each day   . If too nauseous then can stay at lower dose longer.  Check   Blood sugars  1-2 x per day  ROV in 6-8 weeks    To review  And then plan  Labs at that time.   Urinary Tract Infection Urinary tract infections (UTIs) can develop anywhere along your urinary tract. Your urinary tract is your body's drainage system for removing wastes and extra water. Your urinary tract includes two kidneys, two ureters, a bladder, and a urethra.  Your kidneys are a pair of bean-shaped organs. Each kidney is about the size of your fist. They are located below your ribs, one on each side of your spine. CAUSES Infections are caused by microbes, which are microscopic organisms, including fungi, viruses, and bacteria. These organisms are so small that they can only  be seen through a microscope. Bacteria are the microbes that most commonly cause UTIs. SYMPTOMS  Symptoms of UTIs may vary by age and gender of the patient and by the location of the infection. Symptoms in young women typically include a frequent and intense urge to urinate and a painful, burning feeling in the bladder or urethra during urination. Older women and men are more likely to be tired, shaky, and weak and have muscle aches and abdominal pain. A fever may mean the infection is in your kidneys. Other symptoms of a kidney infection include pain in your back or sides below the ribs, nausea, and vomiting. DIAGNOSIS To diagnose a UTI, your caregiver will ask you about your symptoms. Your caregiver will also ask you to provide a urine sample. The urine sample will be tested for bacteria and white blood cells. White blood cells are made by your body to help fight infection. TREATMENT  Typically, UTIs can be treated with medication. Because most UTIs are caused by a bacterial infection, they usually can be treated with the use of antibiotics. The choice of antibiotic and length of treatment depend on your symptoms and the type of bacteria causing your infection. HOME CARE INSTRUCTIONS  If you were prescribed antibiotics, take them exactly as your caregiver instructs you. Finish the medication even if you feel better after you have only taken some of the medication.  Drink enough water and fluids to keep your urine clear or pale yellow.  Avoid caffeine, tea, and carbonated beverages. They tend to irritate your bladder.  Empty your bladder often. Avoid holding urine for long periods of  time.  Empty your bladder before and after sexual intercourse.  After a bowel movement, women should cleanse from front to back. Use each tissue only once. SEEK MEDICAL CARE IF:   You have back pain.  You develop a fever.  Your symptoms do not begin to resolve within 3 days. SEEK IMMEDIATE MEDICAL CARE IF:   You have severe back pain or lower abdominal pain.  You develop chills.  You have nausea or vomiting.  You have continued burning or discomfort with urination. MAKE SURE YOU:   Understand these instructions.  Will watch your condition.  Will get help right away if you are not doing well or get worse.   This information is not intended to replace advice given to you by your health care provider. Make sure you discuss any questions you have with your health care provider.   Document Released: 11/06/2004 Document Revised: 10/18/2014 Document Reviewed: 03/07/2011 Elsevier Interactive Patient Education 2016 [place] [employer]. Elmin Wiederholt M.D.

## 2014-12-06 LAB — URINE CULTURE: Colony Count: >=100000

## 2014-12-21 ENCOUNTER — Other Ambulatory Visit: Payer: Self-pay | Admitting: Internal Medicine

## 2014-12-21 MED ORDER — NITROFURANTOIN MONOHYD MACRO 100 MG PO CAPS: 100.0000 mg | ORAL_CAPSULE | Freq: Two times a day (BID) | ORAL | Status: DC

## 2014-12-21 NOTE — Telephone Encounter (Signed)
Pt states her UTI has not cleared up and is requesting a refill of nitrofurantoin, macrocrystal-monohydrate, (MACROBID) 100 MG capsule  Pt was seen 10/24  [facility]

## 2014-12-21 NOTE — Telephone Encounter (Signed)
Tried to reach the pt by telephone.  Received a message that the voicemail box has not been set up.  Will try again at a later time. 

## 2014-12-21 NOTE — Telephone Encounter (Signed)
Tried reaching the pt.  No voicemail available.  Will try again at a later time.

## 2014-12-21 NOTE — Telephone Encounter (Signed)
Continues to have urinary frequency.  Dysuria started again this morning.  Some lower abdominal pain yesterday but not today.  Denied fever, chills, hematuria, back pain/side pain.  Requesting a refill of her antibiotic.  Advised will forward to [facility] and will wait on a response.

## 2014-12-21 NOTE — Telephone Encounter (Signed)
Rx sent 

## 2014-12-21 NOTE — Telephone Encounter (Signed)
If she had initial improvement with last rx   Ok to refill x 1  But disp 14 take for 7 days  If not better plan rov

## 2014-12-21 NOTE — Telephone Encounter (Signed)
Dr Regis Bill said it was okay to refill

## 2014-12-22 NOTE — Telephone Encounter (Signed)
Spoke to the pt.  The pharmacy notified her that she had a prescription ready.  Has started the medication.

## 2015-02-16 ENCOUNTER — Other Ambulatory Visit: Payer: Self-pay | Admitting: Internal Medicine

## 2015-03-22 ENCOUNTER — Ambulatory Visit (INDEPENDENT_AMBULATORY_CARE_PROVIDER_SITE_OTHER): Payer: 59 | Admitting: Family Medicine

## 2015-03-22 ENCOUNTER — Encounter: Payer: Self-pay | Admitting: Family Medicine

## 2015-03-22 VITALS — BP 130/90 | HR 90 | Temp 97.4°F | Ht 63.5 in | Wt 186.3 lb

## 2015-03-22 DIAGNOSIS — J0100 Acute maxillary sinusitis, unspecified: Secondary | ICD-10-CM | POA: Diagnosis not present

## 2015-03-22 DIAGNOSIS — Z23 Encounter for immunization: Secondary | ICD-10-CM

## 2015-03-22 MED ORDER — AMOXICILLIN-POT CLAVULANATE 875-125 MG PO TABS: 1.0000 | ORAL_TABLET | Freq: Two times a day (BID) | ORAL | Status: DC

## 2015-03-22 MED ORDER — GLUCOSE BLOOD VI STRP: ORAL_STRIP | Status: DC

## 2015-03-22 NOTE — Patient Instructions (Signed)
BEFORE YOU LEAVE: -flu shot if she wishes -schedule regular follow up with PCP next available and per patient schedule in the next 2 months  Take the antibiotic as instructed  Afrin nasal spray for 3 days then stop  Follow up with worsening, new concerns or symptoms persist

## 2015-03-22 NOTE — Progress Notes (Addendum)
HPI:  Sinusitis: -started: 2 weeks ago and feels has worsened the last few days -symptoms:nasal congestion - now thick and white, sore throat, cough, drainage, L ear pain, sinus pressure -denies:fever, SOB, NVD, tooth pain -has tried: tylenol cold and sinus -Hx of: allergies -has DM and a number of other chronic issues and reports she will see her PCP for this soon but requests refill on her test strips  ROS: See pertinent positives and negatives per HPI.  Past Medical History  Diagnosis Date  . Allergic rhinitis   . Diabetes mellitus   . Hyperlipidemia   . Hypertension   . IBS (irritable bowel syndrome)   . Sciatica   . Normal nuclear stress test 1 /08    low risk study     Past Surgical History  Procedure Laterality Date  . Cesarean section      x 3; [id number]  . Nm myoview ltd  02/2006    low risk study    Family History  Problem Relation Age of Onset  . Acute myelogenous leukemia Mother     died when pat age 43   . Diabetes Father   . Diabetes Paternal Grandfather     Social History   Social History  . Marital Status: Legally Separated    Spouse Name: N/A  . Number of Children: N/A  . Years of Education: N/A   Social History Main Topics  . Smoking status: Never Smoker   . Smokeless tobacco: Never Used  . Alcohol Use: No  . Drug Use: No  . Sexual Activity: Not Asked   Other Topics Concern  . 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            Current outpatient prescriptions:  .  atorvastatin (LIPITOR) 20 MG tablet, Take 1 tablet (20 mg total) by mouth daily., Disp: 90 tablet, Rfl: 3 .  FREESTYLE UNISTICK II LANCETS MISC, Use to check blood sugar 1-2 times daily, Disp: 100 each, Rfl: 3 .  glimepiride  (AMARYL) 4 MG tablet, Take 1 tablet (4 mg total) by mouth daily before breakfast., Disp: 90 tablet, Rfl: 3 .  Glucose Blood (FREESTYLE TEST VI), by In Vitro route.  , Disp: , Rfl:  .  glucose blood test strip, Use to check blood sugar 1-2 times daily, Disp: 100 each, Rfl: 3 .  Insulin Pen Needle (NOVOFINE PLUS) 32G X 4 MM MISC, Use once daily with Victoza injection., Disp: 100 each, Rfl: 3 .  Liraglutide (VICTOZA) 18 MG/3ML SOPN, Initial: 0.6 mg SUBQ every day for 1 week; Type 2 diabetes mellitus: (then increase to 1.2 mg SUBQ once daily;, Disp: 3 pen, Rfl: 0 .  losartan (COZAAR) 50 MG tablet, Take 1 tablet (50 mg total) by mouth daily., Disp: 30 tablet, Rfl: 0 .  metFORMIN (GLUCOPHAGE) 850 MG tablet, TAKE 1 TABLET (850 MG TOTAL) BY MOUTH 2 (TWO) TIMES DAILY WITH A MEAL., Disp: 60 tablet, Rfl: 0 .  pioglitazone (ACTOS) 15 MG tablet, TAKE 1 TABLET (15 MG TOTAL) BY MOUTH DAILY., Disp: 30 tablet, Rfl: 0 .  saxagliptin HCl (ONGLYZA) 5 MG TABS  tablet, Take 1 tablet (5 mg total) by mouth daily., Disp: 30 tablet, Rfl: 6 .  amoxicillin-clavulanate (AUGMENTIN) 875-125 MG tablet, Take 1 tablet by mouth 2 (two) times daily., Disp: 20 tablet, Rfl: 0 .  glucose blood (FREESTYLE LITE) test strip, Use as instructed, Disp: 100 each, Rfl: 0  EXAM:  Filed Vitals:   03/22/15 1427  BP: 130/90  Pulse: 90  Temp: 97.4 F (36.3 C)    Body mass index is 32.48 kg/(m^2).  GENERAL: vitals reviewed and listed above, alert, oriented, appears well hydrated and in no acute distress  HEENT: atraumatic, conjunttiva clear, no obvious abnormalities on inspection of external nose and ears, normal appearance of ear canals and TMs, clear nasal congestion, mild post oropharyngeal erythema with PND, no tonsillar edema or exudate, no sinus TTP  NECK: no obvious masses on inspection  LUNGS: clear to auscultation bilaterally, no wheezes, rales or rhonchi, good air movement  CV: HRRR, no peripheral edema  MS: moves all  extremities without noticeable abnormality  PSYCH: pleasant and cooperative, no obvious depression or anxiety  ASSESSMENT AND PLAN:  Discussed the following assessment and plan:  Acute maxillary sinusitis, recurrence not specified  Tx likely sinusitis with augmentin. Return precautions and risks discussed. We discussed treatment side effects, likely course, antibiotic misuse, transmission, and signs of developing a serious illness. Advised prompt follow up with PCP for chronic medical problems as appears did not follow up as advised at last PCP visit and needs recheck BP. -of course, we advised to return or notify a doctor immediately if symptoms worsen or persist or new concerns arise.    Patient Instructions  BEFORE YOU LEAVE: -flu shot if she wishes -schedule regular follow up with PCP next available and per patient schedule in the next 2 months  Take the antibiotic as instructed  Afrin nasal spray for 3 days then stop  Follow up with worsening, new concerns or symptoms persist       KIM, HANNAH R.

## 2015-03-22 NOTE — Progress Notes (Signed)
Pre visit review using our clinic review tool, if applicable. No additional management support is needed unless otherwise documented below in the visit note. 

## 2015-04-14 ENCOUNTER — Other Ambulatory Visit: Payer: Self-pay | Admitting: Internal Medicine

## 2015-04-17 NOTE — Telephone Encounter (Signed)
Ok x 60 days pat has appt  In April   Over due for OV

## 2015-04-17 NOTE — Telephone Encounter (Signed)
Sent to the pharmacy by e-scribe. 

## 2015-05-14 ENCOUNTER — Ambulatory Visit: Payer: 59 | Admitting: Internal Medicine

## 2015-06-05 ENCOUNTER — Ambulatory Visit: Payer: 59 | Admitting: Internal Medicine

## 2015-06-12 ENCOUNTER — Ambulatory Visit (INDEPENDENT_AMBULATORY_CARE_PROVIDER_SITE_OTHER): Payer: 59 | Admitting: Internal Medicine

## 2015-06-12 ENCOUNTER — Encounter: Payer: Self-pay | Admitting: Internal Medicine

## 2015-06-12 VITALS — BP 128/80 | Temp 98.0°F | Wt 191.7 lb

## 2015-06-12 DIAGNOSIS — L989 Disorder of the skin and subcutaneous tissue, unspecified: Secondary | ICD-10-CM

## 2015-06-12 DIAGNOSIS — E785 Hyperlipidemia, unspecified: Secondary | ICD-10-CM | POA: Diagnosis not present

## 2015-06-12 DIAGNOSIS — I10 Essential (primary) hypertension: Secondary | ICD-10-CM | POA: Diagnosis not present

## 2015-06-12 DIAGNOSIS — E1165 Type 2 diabetes mellitus with hyperglycemia: Secondary | ICD-10-CM | POA: Diagnosis not present

## 2015-06-12 DIAGNOSIS — IMO0001 Reserved for inherently not codable concepts without codable children: Secondary | ICD-10-CM

## 2015-06-12 LAB — CBC WITH DIFFERENTIAL/PLATELET
BASOS PCT: 0.4 % (ref 0.0–3.0)
Basophils Absolute: 0 10*3/uL (ref 0.0–0.1)
Eosinophils Absolute: 0.2 10*3/uL (ref 0.0–0.7)
Eosinophils Relative: 1.8 % (ref 0.0–5.0)
HCT: 42.6 % (ref 36.0–46.0)
HEMOGLOBIN: 14.9 g/dL (ref 12.0–15.0)
Lymphocytes Relative: 32.5 % (ref 12.0–46.0)
Lymphs Abs: 2.8 10*3/uL (ref 0.7–4.0)
MCHC: 35 g/dL (ref 30.0–36.0)
MCV: 86.3 fl (ref 78.0–100.0)
MONO ABS: 0.6 10*3/uL (ref 0.1–1.0)
Monocytes Relative: 6.8 % (ref 3.0–12.0)
Neutro Abs: 5.1 10*3/uL (ref 1.4–7.7)
Neutrophils Relative %: 58.5 % (ref 43.0–77.0)
Platelets: 346 10*3/uL (ref 150.0–400.0)
RBC: 4.93 Mil/uL (ref 3.87–5.11)
RDW: 13 % (ref 11.5–15.5)
WBC: 8.7 10*3/uL (ref 4.0–10.5)

## 2015-06-12 LAB — BASIC METABOLIC PANEL
BUN: 10 mg/dL (ref 6–23)
CHLORIDE: 101 meq/L (ref 96–112)
CO2: 25 mEq/L (ref 19–32)
Calcium: 9.2 mg/dL (ref 8.4–10.5)
Creatinine, Ser: 0.63 mg/dL (ref 0.40–1.20)
GFR: 101.42 mL/min (ref >60.00)
Glucose, Bld: 288 mg/dL — ABNORMAL HIGH (ref 70–99)
POTASSIUM: 4 meq/L (ref 3.5–5.1)
Sodium: 135 mEq/L (ref 135–145)

## 2015-06-12 LAB — HEPATIC FUNCTION PANEL
ALBUMIN: 3.9 g/dL (ref 3.5–5.2)
ALT: 14 U/L (ref 0–35)
AST: 14 U/L (ref 0–37)
Alkaline Phosphatase: 91 U/L (ref 39–117)
Bilirubin, Direct: 0.1 mg/dL (ref 0.0–0.3)
Total Bilirubin: 0.4 mg/dL (ref 0.2–1.2)
Total Protein: 6.7 g/dL (ref 6.0–8.3)

## 2015-06-12 LAB — TSH: TSH: 1.04 u[IU]/mL (ref 0.35–4.50)

## 2015-06-12 LAB — HEMOGLOBIN A1C: Hgb A1c MFr Bld: 10.5 % — ABNORMAL HIGH (ref 4.6–6.5)

## 2015-06-12 MED ORDER — ATORVASTATIN CALCIUM 20 MG PO TABS: 20.0000 mg | ORAL_TABLET | Freq: Every day | ORAL | Status: DC

## 2015-06-12 MED ORDER — PIOGLITAZONE HCL 15 MG PO TABS: ORAL_TABLET | ORAL | Status: DC

## 2015-06-12 MED ORDER — DULAGLUTIDE 0.75 MG/0.5ML ~~LOC~~ SOAJ: SUBCUTANEOUS | Status: DC

## 2015-06-12 MED ORDER — METFORMIN HCL 850 MG PO TABS: ORAL_TABLET | ORAL | Status: DC

## 2015-06-12 NOTE — Patient Instructions (Signed)
Get back on healthy eating, Will notify you  of labs when available. Begin trulicity  Once a week  ROV in 3 months with   We can do a lipid panel and a1c at that time .  [facility]  [address] [place],  [address] [phone] Is the name of the derm in this office park.

## 2015-06-12 NOTE — Progress Notes (Signed)
Pre visit review using our clinic review tool, if applicable. No additional management support is needed unless otherwise documented below in the visit note.  Chief Complaint  Patient presents with  . Follow-up    late for fu check skin area   . Diabetes    HPI: [name] [name] 65 y.o.   Last seen in fall   Lost or late  to follow up  Was rx for sinusisit  In feb dr kim  She needs a refill on her metformin her Actos and Lipitor but doesn't remember taking it recently. She was given thick toes at the last visit and recommended follow-up but she put the samples in the refrigerator and they expired and she never used them. She states however her blood sugars were not too bad in the 1 4150 range until about February when she started eating badly including candy and sugars. She hasn't checked her blood sugar and at least 3 weeks because they're something wrong with the battery and hasn't been able to replace it yet.  Denies any major vision problems neurologic symptoms with her diabetes.  There is a lesion on her left leg that has been there month it is tender and red but firm.  ROS: See pertinent positives and negatives per HPI. No recent heart issues breast alert respiratory.  Past Medical History  Diagnosis Date  . Allergic rhinitis   . Diabetes mellitus   . Hyperlipidemia   . Hypertension   . IBS (irritable bowel syndrome)   . Sciatica   . Normal nuclear stress test 1 /08    low risk study     Family History  Problem Relation Age of Onset  . Acute myelogenous leukemia Mother     died when pat age 61   . Diabetes Father   . Diabetes Paternal Grandfather     Social History   Social History  . Marital Status: Legally Separated    Spouse Name: N/A  . Number of Children: N/A  . Years of Education: N/A   Social History Main Topics  . Smoking status: Never Smoker   . Smokeless tobacco: Never Used  . Alcohol Use: No  . Drug Use: No  . Sexual Activity: Not on file    Other Topics Concern  . 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           Outpatient Prescriptions Prior to Visit  Medication Sig Dispense Refill  . FREESTYLE UNISTICK II LANCETS MISC Use to check blood sugar 1-2 times daily 100 each 3  . glimepiride (AMARYL) 4 MG tablet Take 1 tablet (4 mg total) by mouth daily before breakfast. 90 tablet 3  . glucose blood (FREESTYLE LITE) test strip Use as instructed 100 each 0  . Glucose Blood (FREESTYLE TEST VI) by In Vitro route.      Marland Kitchen glucose blood test strip Use to check blood sugar 1-2 times daily 100 each 3  . Insulin Pen Needle (NOVOFINE PLUS) 32G X 4 MM MISC Use once daily with Victoza injection. 100 each 3  . Liraglutide (VICTOZA) 18 MG/3ML SOPN Initial:  0.6 mg SUBQ every day for 1 week; Type 2 diabetes mellitus: (then increase to 1.2 mg SUBQ once daily; 3 pen 0  . losartan (COZAAR) 50 MG tablet Take 1 tablet (50 mg total) by mouth daily. 30 tablet 0  . saxagliptin HCl (ONGLYZA) 5 MG TABS tablet Take 1 tablet (5 mg total) by mouth daily. 30 tablet 6  . atorvastatin (LIPITOR) 20 MG tablet Take 1 tablet (20 mg total) by mouth daily. 90 tablet 3  . metFORMIN (GLUCOPHAGE) 850 MG tablet TAKE 1 TABLET (850 MG TOTAL) BY MOUTH 2 (TWO) TIMES DAILY WITH A MEAL. 60 tablet 0  . pioglitazone (ACTOS) 15 MG tablet TAKE 1 TABLET (15 MG TOTAL) BY MOUTH DAILY. 30 tablet 0  . amoxicillin-clavulanate (AUGMENTIN) 875-125 MG tablet Take 1 tablet by mouth 2 (two) times daily. 20 tablet 0   No facility-administered medications prior to visit.     EXAM:  BP 128/80 mmHg  Temp(Src) 98 F (36.7 C) (Oral)  Wt 191 lb 11.2 oz (86.955 kg)  Body mass index is 33.42 kg/(m^2).  GENERAL: vitals reviewed and  listed above, alert, oriented, appears well hydrated and in no acute distressHas some central obesity. HEENT: atraumatic, conjunctiva  clear, no obvious abnormalities on inspection of external nose and ears  NECK: no obvious masses on inspection palpation  LUNGS: clear to auscultation bilaterally, no wheezes, rales or rhonchi, good air movement CV: HRRR, no clubbing cyanosis or  peripheral edema nl cap refill  MS: moves all extremities without noticeable focal  abnormality PSYCH: pleasant and cooperative, no obvious depression or anxiety Left lower extremity shows a 3-4 mm firm reddened area with central white scaling that is mildly tender but no fluctuance. Her last A1c was 10 .1 over 6 months ago.  ASSESSMENT AND PLAN:  Discussed the following assessment and plan:  Uncontrolled type 2 diabetes mellitus without complication, without long-term current use of insulin (HCC) - We'll refill her Actos metformin for 3 months close follow-up she has not followed up as planned over the last couple years diabetes has been out of control cou - Plan: Basic metabolic panel, CBC with Differential/Platelet, Hemoglobin A1c, Hepatic function panel, TSH  Essential hypertension - Controlled - Plan: Basic metabolic panel, CBC with Differential/Platelet, Hemoglobin A1c, Hepatic function panel, TSH  Hyperlipidemia - Get back on her Lipitor will recheck her labs in 3 months. - Plan: Basic metabolic panel, CBC with Differential/Platelet, Hemoglobin A1c, Hepatic function panel, TSH  Skin lesion - Red tender keratotic lesion lasting more than a month options discussed she will see dermatologist - Plan: Basic metabolic panel, CBC with Differential/Platelet, Hemoglobin A1c, Hepatic function panel, TSH Not checking  Blood sugars   Machine  Broken ? Battery issue.  Pt says good until February .  Then not eating 150 160.   Coupon and prescription given for trulicity  lowest dose may help with compliance because it is  weakly she can choose to begin after getting her A1c results back told her to go ahead with lab tests today but she is not fasting  Plan 3 month rov -Patient advised to return or notify health care team  if symptoms worsen ,persist or new concerns arise.  Patient Instructions  Get back on healthy eating, Will notify you  of labs when available. Begin trulicity  Once a week  ROV in 3 months with   We can do a lipid panel and a1c at that time .  [facility]  [phone]  [place] [address] [place], [place] [address] [phone] Is the name of the derm in this office park.      Standley Brooking. Panosh M.D.

## 2015-07-03 ENCOUNTER — Encounter: Payer: Self-pay | Admitting: Gastroenterology

## 2015-07-25 ENCOUNTER — Ambulatory Visit (INDEPENDENT_AMBULATORY_CARE_PROVIDER_SITE_OTHER): Payer: 59 | Admitting: Family Medicine

## 2015-07-25 VITALS — BP 100/80 | HR 117 | Temp 98.4°F | Ht 63.5 in | Wt 192.5 lb

## 2015-07-25 DIAGNOSIS — L0201 Cutaneous abscess of face: Secondary | ICD-10-CM | POA: Diagnosis not present

## 2015-07-25 MED ORDER — DOXYCYCLINE HYCLATE 100 MG PO CAPS: 100.0000 mg | ORAL_CAPSULE | Freq: Two times a day (BID) | ORAL | Status: DC

## 2015-07-25 NOTE — Progress Notes (Signed)
Pre visit review using our clinic review tool, if applicable. No additional management support is needed unless otherwise documented below in the visit note. 

## 2015-07-25 NOTE — Patient Instructions (Signed)
Abscess An abscess is an infected area that contains a collection of pus and debris.It can occur in almost any part of the body. An abscess is also known as a furuncle or boil. CAUSES  An abscess occurs when tissue gets infected. This can occur from blockage of oil or sweat glands, infection of hair follicles, or a minor injury to the skin. As the body tries to fight the infection, pus collects in the area and creates pressure under the skin. This pressure causes pain. People with weakened immune systems have difficulty fighting infections and get certain abscesses more often.  SYMPTOMS Usually an abscess develops on the skin and becomes a painful mass that is red, warm, and tender. If the abscess forms under the skin, you may feel a moveable soft area under the skin. Some abscesses break open (rupture) on their own, but most will continue to get worse without care. The infection can spread deeper into the body and eventually into the bloodstream, causing you to feel ill.  DIAGNOSIS  Your caregiver will take your medical history and perform a physical exam. A sample of fluid may also be taken from the abscess to determine what is causing your infection. TREATMENT  Your caregiver may prescribe antibiotic medicines to fight the infection. However, taking antibiotics alone usually does not cure an abscess. Your caregiver may need to make a small cut (incision) in the abscess to drain the pus. In some cases, gauze is packed into the abscess to reduce pain and to continue draining the area. HOME CARE INSTRUCTIONS   Only take over-the-counter or prescription medicines for pain, discomfort, or fever as directed by your caregiver.  If you were prescribed antibiotics, take them as directed. Finish them even if you start to feel better.  If gauze is used, follow your caregiver's directions for changing the gauze.  To avoid spreading the infection:  Keep your draining abscess covered with a  bandage.  Wash your hands well.  Do not share personal care items, towels, or whirlpools with others.  Avoid skin contact with others.  Keep your skin and clothes clean around the abscess.  Keep all follow-up appointments as directed by your caregiver. SEEK MEDICAL CARE IF:   You have increased pain, swelling, redness, fluid drainage, or bleeding.  You have muscle aches, chills, or a general ill feeling.  You have a fever. MAKE SURE YOU:   Understand these instructions.  Will watch your condition.  Will get help right away if you are not doing well or get worse.   This information is not intended to replace advice given to you by your health care provider. Make sure you discuss any questions you have with your health care provider.   Document Released: 11/06/2004 Document Revised: 07/29/2011 Document Reviewed: 04/11/2011 Elsevier Interactive Patient Education 2016 [employer].   Use warm compresses to face several times daily. Follow up promptly for any fever or progressive redness, swelling, or fever. Let's plan on follow up by Friday.

## 2015-07-25 NOTE — Progress Notes (Signed)
   Subjective:    Patient ID: [name] [name], female    DOB: 10-18-1950, 65 y.o.   MRN: [id number]  HPI Acute visit for facial swelling and pain left chin region. She first noticed some redness and mild swelling Monday morning. Progressive through the week. She noted slightly pustular center this afternoon and expressed a little bit of pus. No fevers or chills. No history of MRSA. She also noticed a little increased swelling today compared with yesterday. She has type 2 diabetes.  Past Medical History  Diagnosis Date  . Allergic rhinitis   . Diabetes mellitus   . Hyperlipidemia   . Hypertension   . IBS (irritable bowel syndrome)   . Sciatica   . Normal nuclear stress test 1 /08    low risk study    Past Surgical History  Procedure Laterality Date  . Cesarean section      x 3; [id number]  . Nm myoview ltd  02/2006    low risk study    reports that she has never smoked. She has never used smokeless tobacco. She reports that she does not drink alcohol or use illicit drugs. family history includes Acute myelogenous leukemia in her mother; Diabetes in her father and paternal grandfather. Allergies  Allergen Reactions  . Naproxen     REACTION: Dizziness and nausea, slowed down pt's reaction time  . Simvastatin     Joint Pain      Review of Systems  Constitutional: Negative for fever and chills.  Gastrointestinal: Negative for nausea and vomiting.  Neurological: Negative for headaches.       Objective:   Physical Exam  Constitutional: She appears well-developed and well-nourished.  Cardiovascular: Normal rate and regular rhythm.   Pulmonary/Chest: Effort normal and breath sounds normal. No respiratory distress. She has no wheezes. She has no rales.  Skin:  Left chin approximately 2 cm area of erythema. She has moderate swelling and induration. Near the center she has a small yellow pustule which was unroofed and minimal. purulence expressed which was cultured. There is  no fluctuance.          Assessment & Plan:  Abscess left chin. This is very indurated. She has some surrounding cellulitis changes. We were able to unroof small pustular center and expressed very minimal pus and this was cultured. Start doxycycline 100 mg twice daily for 10 days. Warm compresses several times daily. Reassess in 2 days and sooner for any fever or any progressive swelling or redness.  Eulas Post MD [facility]

## 2015-07-27 ENCOUNTER — Encounter: Payer: Self-pay | Admitting: Internal Medicine

## 2015-07-27 ENCOUNTER — Ambulatory Visit (INDEPENDENT_AMBULATORY_CARE_PROVIDER_SITE_OTHER): Payer: 59 | Admitting: Internal Medicine

## 2015-07-27 VITALS — BP 146/80 | Temp 98.3°F | Wt 191.1 lb

## 2015-07-27 DIAGNOSIS — IMO0001 Reserved for inherently not codable concepts without codable children: Secondary | ICD-10-CM

## 2015-07-27 DIAGNOSIS — L0201 Cutaneous abscess of face: Secondary | ICD-10-CM

## 2015-07-27 DIAGNOSIS — E1165 Type 2 diabetes mellitus with hyperglycemia: Secondary | ICD-10-CM

## 2015-07-27 NOTE — Progress Notes (Signed)
Pre visit review using our clinic review tool, if applicable. No additional management support is needed unless otherwise documented below in the visit note.  Chief Complaint  Patient presents with  . Follow-up    HPI:  [name] [name] 65 y.o. comes in for follow-up 2 days after being evaluated for an abscess on her left chin. It is been draining after her hot compresses and a whole lot better at least 50%. She is on doxycycline without side effect.  In regard to her diabetes she is back on all of her medicine but hasn't started the Trulicity yet asks if she should shouldn't wait until she is off the antibiotic before starting any medication. Her blood sugars are in the 120-180 range  over 200s when she eats. Hasn't made her follow-up appointment yet. ROS: See pertinent positives and negatives per HPI.  Past Medical History  Diagnosis Date  . Allergic rhinitis   . Diabetes mellitus   . Hyperlipidemia   . Hypertension   . IBS (irritable bowel syndrome)   . Sciatica   . Normal nuclear stress test 1 /08    low risk study     Family History  Problem Relation Age of Onset  . Acute myelogenous leukemia Mother     died when pat age 68   . Diabetes Father   . Diabetes Paternal Grandfather     Social History   Social History  . Marital Status: Legally Separated    Spouse Name: N/A  . Number of Children: N/A  . Years of Education: N/A   Social History Main Topics  . Smoking status: Never Smoker   . Smokeless tobacco: Never Used  . Alcohol Use: No  . Drug Use: No  . Sexual Activity: Not Asked   Other Topics Concern  . 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           Outpatient Prescriptions Prior to Visit  Medication Sig  Dispense Refill  . atorvastatin (LIPITOR) 20 MG tablet Take 1 tablet (20 mg total) by mouth daily. 90 tablet 1  . doxycycline (VIBRAMYCIN) 100 MG capsule Take 1 capsule (100 mg total) by mouth 2 (two) times daily. 20 capsule 0  . Dulaglutide (TRULICITY) [id number] SOPN 0.75 mg subQ once weekly; 4 pen 3  . FREESTYLE UNISTICK II LANCETS MISC Use to check blood sugar 1-2 times daily 100 each 3  . glimepiride (AMARYL) 4 MG tablet Take 1 tablet (4 mg total) by mouth daily before breakfast. 90 tablet 3  . glucose blood (FREESTYLE LITE) test strip Use as instructed 100 each 0  . Glucose Blood (FREESTYLE TEST VI) by In Vitro route.      Marland Kitchen glucose blood test strip Use to check blood sugar 1-2 times daily 100 each 3  . Insulin Pen Needle (NOVOFINE PLUS) 32G X 4 MM MISC Use once daily with Victoza injection. 100 each 3  . losartan (COZAAR) 50 MG tablet Take 1 tablet (50 mg total) by mouth daily. 30 tablet 0  . metFORMIN (GLUCOPHAGE) 850 MG tablet TAKE 1 TABLET (850 MG TOTAL) BY MOUTH 2 (TWO)  TIMES DAILY WITH A MEAL. 60 tablet 2  . pioglitazone (ACTOS) 15 MG tablet TAKE 1 TABLET (15 MG TOTAL) BY MOUTH DAILY. 30 tablet 2  . saxagliptin HCl (ONGLYZA) 5 MG TABS tablet Take 1 tablet (5 mg total) by mouth daily. 30 tablet 6   No facility-administered medications prior to visit.     EXAM:  BP 146/80 mmHg  Temp(Src) 98.3 F (36.8 C) (Oral)  Wt 191 lb 1.6 oz (86.682 kg)  Body mass index is 33.32 kg/(m^2).  GENERAL: vitals reviewed and listed above, alert, oriented, appears well hydrated and in no acute distress HEENT: atraumatic, conjunctiva  clear, no obvious abnormalities on inspection of external nose and ears  NECK: no obvious masses on inspection palpation No tender adenopathy. Skin  As per picture  Minimal fluctuance tendernes  No streaking MS: moves all extremities without noticeable focal  abnormality PSYCH: pleasant and cooperative, no obvious depression or anxiety   Lab gm stain multiple  polys and gram-positive cocci in clusters. Identification sensitivities pending. ASSESSMENT AND PLAN:  Discussed the following assessment and plan:  Abscess of face  Uncontrolled type 2 diabetes mellitus without complication, without long-term current use of insulin ([facility]) Much improved at this time.  Gm stain c/w staph  Culture pending  . Consideration of adding diclox or keflex if  Not improving etc .   Disc dm and fu  hasn't started trulicity  Get on schedule to fyu  Says bg in 120 180 range but in 200 after eating  -Patient advised to return or notify health care team  if symptoms worsen ,persist or new concerns arise.  Patient Instructions  Continue same antibiotic  Warm compresses and topical anti biotic ok   If not continuing to improve over the next 3- 4 days.  Contact us and may add different antibiotic .   Start the trulicity as planned   Can ROV   In 3 months after beginning truclity or earlier if sugars are indeed going up.      Standley Brooking. Juandavid Dallman M.D. Lab Results  Component Value Date   WBC 8.7 06/12/2015   HGB 14.9 06/12/2015   HCT 42.6 06/12/2015   PLT 346.0 06/12/2015   GLUCOSE 288* 06/12/2015   CHOL 236* 08/01/2014   TRIG 262.0* 08/01/2014   HDL 56.10 08/01/2014   LDLDIRECT 134.0 08/01/2014   LDLCALC 73 10/09/2011   ALT 14 06/12/2015   AST 14 06/12/2015   NA 135 06/12/2015   K 4.0 06/12/2015   CL 101 06/12/2015   CREATININE 0.63 06/12/2015   BUN 10 06/12/2015   CO2 25 06/12/2015   TSH 1.04 06/12/2015   HGBA1C 10.5* 06/12/2015   MICROALBUR 0.1 09/15/2013

## 2015-07-27 NOTE — Patient Instructions (Addendum)
Continue same antibiotic  Warm compresses and topical anti biotic ok   If not continuing to improve over the next 3- 4 days.  Contact us and may add different antibiotic .   Start the trulicity as planned   Can ROV   In 3 months after beginning truclity or earlier if sugars are indeed going up.

## 2015-07-29 LAB — WOUND CULTURE: Gram Stain: NONE SEEN

## 2015-08-24 ENCOUNTER — Other Ambulatory Visit: Payer: Self-pay | Admitting: Internal Medicine

## 2015-08-28 NOTE — Telephone Encounter (Signed)
Sent to the pharmacy by e-scribe. 

## 2015-09-19 ENCOUNTER — Other Ambulatory Visit: Payer: Self-pay | Admitting: Internal Medicine

## 2015-09-19 NOTE — Telephone Encounter (Signed)
Sent to the pharmacy by e-scribe. 

## 2015-11-07 ENCOUNTER — Ambulatory Visit: Payer: 59 | Admitting: Internal Medicine

## 2015-11-09 ENCOUNTER — Other Ambulatory Visit: Payer: Self-pay | Admitting: Internal Medicine

## 2016-01-16 NOTE — Progress Notes (Signed)
Pre visit review using our clinic review tool, if applicable. No additional management support is needed unless otherwise documented below in the visit note.  Chief Complaint  Patient presents with  . Follow-up    HPI: [name] [name] 65 y.o.  has out-of-control diabetes hypertension hyperlipidemia nonadherence to medication plan who comes in today for follow-up. Her last visit was in May. She was to follow-up sooner but. For Chronic disease management BG still up didn't take trulicity cause concern could have gi side effects   Was able to do victoza in past      No vision  Numbness falling cv pulm sx   ROS: See pertinent positives and negatives per HPI.  Left jaw tenderness continues  To see os after dentist  And ent   Past Medical History:  Diagnosis Date  . Allergic rhinitis   . Diabetes mellitus   . Hyperlipidemia   . Hypertension   . IBS (irritable bowel syndrome)   . Normal nuclear stress test 1 /08   low risk study   . Sciatica     Family History  Problem Relation Age of Onset  . Acute myelogenous leukemia Mother     died when pat age 52   . Diabetes Father   . Diabetes Paternal Grandfather     Social History   Social History  . Marital status: Legally Separated    Spouse name: N/A  . Number of children: N/A  . Years of education: N/A   Social History Main Topics  . Smoking status: Never Smoker  . Smokeless tobacco: Never Used  . Alcohol use No  . Drug use: No  . Sexual activity: Not Asked   Other Topics Concern  . 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           Outpatient Medications Prior to Visit  Medication Sig Dispense Refill  . atorvastatin (LIPITOR) 20 MG tablet Take 1 tablet (20 mg total) by  mouth daily. 90 tablet 1  . FREESTYLE UNISTICK II LANCETS MISC Use to check blood sugar 1-2 times daily 100 each 3  . glimepiride (AMARYL) 4 MG tablet Take 1 tablet (4 mg total) by mouth daily before breakfast. 90 tablet 3  . glucose blood (FREESTYLE LITE) test strip Use as instructed 100 each 0  . Glucose Blood (FREESTYLE TEST VI) by In Vitro route.      Marland Kitchen glucose blood test strip Use to check blood sugar 1-2 times daily 100 each 3  . losartan (COZAAR) 50 MG tablet TAKE 1 TABLET (50 MG TOTAL) BY MOUTH DAILY. 30 tablet 2  . metFORMIN (GLUCOPHAGE) 850 MG tablet TAKE 1 TABLET (850 MG TOTAL) BY MOUTH 2 (TWO) TIMES DAILY WITH A MEAL. 60 tablet 3  . ONGLYZA 5 MG TABS tablet TAKE ONE TABLET BY MOUTH ONE TIME DAILY 30 tablet 1  . pioglitazone (ACTOS) 15 MG tablet TAKE 1 TABLET (15 MG TOTAL) BY MOUTH DAILY. 30 tablet 3  . doxycycline (VIBRAMYCIN) 100 MG capsule Take 1 capsule (100 mg total) by mouth 2 (two) times daily. 20 capsule 0  . Dulaglutide (TRULICITY) [id number]  MG/0.5ML SOPN 0.75 mg subQ once weekly; 4 pen 3  . Insulin Pen Needle (NOVOFINE PLUS) 32G X 4 MM MISC Use once daily with Victoza injection. 100 each 3   No facility-administered medications prior to visit.      EXAM:  BP (!) 144/80 (BP Location: Right Arm, Patient Position: Sitting, Cuff Size: Normal)   Temp 98.3 F (36.8 C) (Oral)   Wt 191 lb (86.6 kg)   BMI 33.30 kg/m   Body mass index is 33.3 kg/m.  GENERAL: vitals reviewed and listed above, alert, oriented, appears well hydrated and in no acute distress HEENT: atraumatic, conjunctiva  clear, no obvious abnormalities on inspection of external nose and ears tm clear  OP : no lesion edema or exudate  Left jaw upper neck area full mil tenderness no lesion NECK: no obvious masses on inspection palpation  LUNGS: clear to auscultation bilaterally, no wheezes, rales or rhonchi, good air movement CV: HRRR, no clubbing cyanosis or  peripheral edema nl cap refill  MS: moves all  extremities without noticeable focal  abnormality PSYCH: pleasant and cooperative, no obvious depression or anxiety Lab Results  Component Value Date   WBC 8.7 06/12/2015   HGB 14.9 06/12/2015   HCT 42.6 06/12/2015   PLT 346.0 06/12/2015   GLUCOSE 288 (H) 06/12/2015   CHOL 236 (H) 08/01/2014   TRIG 262.0 (H) 08/01/2014   HDL 56.10 08/01/2014   LDLDIRECT 134.0 08/01/2014   LDLCALC 73 10/09/2011   ALT 14 06/12/2015   AST 14 06/12/2015   NA 135 06/12/2015   K 4.0 06/12/2015   CL 101 06/12/2015   CREATININE 0.63 06/12/2015   BUN 10 06/12/2015   CO2 25 06/12/2015   TSH 1.04 06/12/2015   HGBA1C 11.0 01/18/2016   MICROALBUR 0.1 09/15/2013    ASSESSMENT AND PLAN:  Discussed the following assessment and plan:  Uncontrolled type 2 diabetes mellitus with complication, without long-term current use of insulin ([place]) - Plan: POCT A1C, Ambulatory referral to Endocrinology  Need for prophylactic vaccination and inoculation against influenza - Plan: Flu Vaccine QUAD 36+ mos PF IM (Fluarix & Fluzone Quad PF)  GI symptoms It is been difficult to advise because we haven't had regular follow-up  She is on multiple medications for diabetes but doesn't want to go on insulin. She didn't take the Trulicity as of concerns about GI side effects. She had been on victoza  in the past and is willing to go back on again. Discussed charting hands medicine risk-benefit that we can add in the short run and refer to endocrinology. r seeing an endocrinologist for better plan of action. She can stop the Onglyza and decrease the Amaryl to avoid hypoglycemia but I'm not sure there is much risk for this. -Patient advised to return or notify health care team  if symptoms worsen ,persist or new concerns arise.  Patient Instructions  Go back on the ptosis and she were able to tolerate it last time. After 1-2 weeks you can begin the charting hands which is the medicine that will make you urinate more and decreased  blood sugar. You can stop the Onglyza to decrease her pill count.  and decrease the Amaryl if sugar is too low 1/2  Will be referring you to an endocrinologist you should get contacted about an appointment. If you haven't heard from someone in a week or 2 about your appointment contact our office.  Continue  Checking bg   Twice  Day in the interim .  Calendar your Gi disturbance   Please make OV cpx  to for 4 months  Assuming you have seen the endocrinologist for diabetes care plan.  Or as needed      Standley Brooking. Noe Goyer M.D.

## 2016-01-18 ENCOUNTER — Ambulatory Visit (INDEPENDENT_AMBULATORY_CARE_PROVIDER_SITE_OTHER): Payer: 59 | Admitting: Internal Medicine

## 2016-01-18 ENCOUNTER — Encounter: Payer: Self-pay | Admitting: Internal Medicine

## 2016-01-18 VITALS — BP 144/80 | Temp 98.3°F | Wt 191.0 lb

## 2016-01-18 DIAGNOSIS — Z23 Encounter for immunization: Secondary | ICD-10-CM | POA: Diagnosis not present

## 2016-01-18 DIAGNOSIS — E118 Type 2 diabetes mellitus with unspecified complications: Secondary | ICD-10-CM

## 2016-01-18 DIAGNOSIS — E1165 Type 2 diabetes mellitus with hyperglycemia: Secondary | ICD-10-CM | POA: Diagnosis not present

## 2016-01-18 DIAGNOSIS — IMO0002 Reserved for concepts with insufficient information to code with codable children: Secondary | ICD-10-CM

## 2016-01-18 DIAGNOSIS — R198 Other specified symptoms and signs involving the digestive system and abdomen: Secondary | ICD-10-CM

## 2016-01-18 LAB — POCT GLYCOSYLATED HEMOGLOBIN (HGB A1C): Hemoglobin A1C: 11

## 2016-01-18 MED ORDER — EMPAGLIFLOZIN 10 MG PO TABS: 10.0000 mg | ORAL_TABLET | Freq: Every day | ORAL | 3 refills | Status: DC

## 2016-01-18 MED ORDER — LIRAGLUTIDE 18 MG/3ML ~~LOC~~ SOPN: PEN_INJECTOR | SUBCUTANEOUS | 0 refills | Status: DC

## 2016-01-18 NOTE — Patient Instructions (Addendum)
Go back on the ptosis and she were able to tolerate it last time. After 1-2 weeks you can begin the charting hands which is the medicine that will make you urinate more and decreased blood sugar. You can stop the Onglyza to decrease her pill count.  and decrease the Amaryl if sugar is too low 1/2  Will be referring you to an endocrinologist you should get contacted about an appointment. If you haven't heard from someone in a week or 2 about your appointment contact our office.  Continue  Checking bg   Twice  Day in the interim .   Calendar your Gi disturbance   Please make OV cpx  to for 4 months  Assuming you have seen the endocrinologist for diabetes care plan.  Or as needed

## 2016-03-03 ENCOUNTER — Encounter: Payer: Self-pay | Admitting: Internal Medicine

## 2016-03-03 ENCOUNTER — Ambulatory Visit (INDEPENDENT_AMBULATORY_CARE_PROVIDER_SITE_OTHER): Payer: 59 | Admitting: Internal Medicine

## 2016-03-03 VITALS — BP 124/74 | HR 100 | Ht 64.0 in | Wt 187.0 lb

## 2016-03-03 DIAGNOSIS — E1165 Type 2 diabetes mellitus with hyperglycemia: Secondary | ICD-10-CM

## 2016-03-03 MED ORDER — METFORMIN HCL 1000 MG PO TABS: ORAL_TABLET | ORAL | 3 refills | Status: DC

## 2016-03-03 MED ORDER — GLIMEPIRIDE 4 MG PO TABS: 4.0000 mg | ORAL_TABLET | Freq: Two times a day (BID) | ORAL | 3 refills | Status: DC

## 2016-03-03 MED ORDER — LIRAGLUTIDE 18 MG/3ML ~~LOC~~ SOPN: PEN_INJECTOR | SUBCUTANEOUS | 5 refills | Status: DC

## 2016-03-03 NOTE — Progress Notes (Signed)
Patient ID: [name] [name], female   DOB: 1950-04-08, 67 y.o.   MRN: [id number]   HPI: [name] [name] is a 66 y.o.-year-old female, referred by her PCP, Dr. Regis Bill, for management of DM2, dx in 2004, non-insulin-dependent, uncontrolled, without long term complications.  Last hemoglobin A1c was: Lab Results  Component Value Date   HGBA1C 11.0 01/18/2016   HGBA1C 10.5 (H) 06/12/2015   HGBA1C 10.1 (H) 08/01/2014   HGBA1C 9.2 (H) 09/15/2013   HGBA1C 8.1 (H) 09/24/2012   HGBA1C 8.2 (H) 06/11/2012   HGBA1C 7.1 (H) 10/09/2011   HGBA1C 9.6 (H) 05/06/2011   HGBA1C 7.7 (H) 01/01/2011   HGBA1C 8.6 (H) 07/31/2010   Pt is on a regimen of: - Metformin 850 mg 2x a day, with meals - Glimepiride 4 mg in am - misses 50% doses! - Onglyza 5 mg in am - ran out 1 week ago - Actos 15 mg daily  She did not use Trulicity b/c afraid of GI SEs. She tolerated Victoza well before.  Pt checks her sugars 0-1x a day - stopped as her meter's battery died: - am: n/c - 2h after b'fast: n/c - before lunch: n/c - 2h after lunch: n/c - before dinner: n/c - 2h after dinner: n/c - bedtime: n/c - nighttime: n/c No lows. Lowest sugar was 200; ? hypoglycemia awareness. Highest sugar was 300s.  In 06/2015, on BMP, glucose was 288.  Glucometer: Freestyle Lite  Pt's meals are: - Breakfast: Hot cakes or cereal - Lunch: Hot dog, spaghetti, Burgers, tacos - Dinner: Pasta, baked chicken, salad, mashed potatoes - Snacks: drinks 4-5 diet cokes a day!!!; Nuts, granola bars, cookies  - no CKD, last BUN/creatinine:  Lab Results  Component Value Date   BUN 10 06/12/2015   BUN 12 08/01/2014   CREATININE 0.63 06/12/2015   CREATININE 0.62 08/01/2014  On Losartan. - last set of lipids: Lab Results  Component Value Date   CHOL 236 (H) 08/01/2014   HDL 56.10 08/01/2014   LDLCALC 73 10/09/2011   LDLDIRECT 134.0 08/01/2014   TRIG 262.0 (H) 08/01/2014   CHOLHDL 4 08/01/2014  Off Lipitor >> as she had joint pain on  it before. - last dilated eye exam was in 2009. No DR.  - no numbness and tingling in her feet.  Pt has FH of DM in father, PGM.  ROS: Constitutional: no weight gain/loss, + fatigue, no subjective hyperthermia/hypothermia, + Excessive urination, + poor sleep Eyes: no blurry vision, no xerophthalmia ENT: no sore throat, no nodules palpated in throat, no dysphagia/odynophagia, no hoarseness Cardiovascular: no CP/SOB/palpitations/leg swelling Respiratory: + cough/no SOB Gastrointestinal: no N/V/+ D/no C Musculoskeletal: no muscle/+ joint aches Skin: no rashes Neurological: no tremors/numbness/tingling/dizziness Psychiatric: no depression/+ anxiety  Past Medical History:  Diagnosis Date  . Allergic rhinitis   . Diabetes mellitus   . Hyperlipidemia   . Hypertension   . IBS (irritable bowel syndrome)   . Normal nuclear stress test 1 /08   low risk study   . Sciatica    Past Surgical History:  Procedure Laterality Date  . CESAREAN SECTION     x 3; [id number]  . NM MYOVIEW LTD  02/2006   low risk study   Social History   Social History Main Topics  . Smoking status: Never Smoker  . Smokeless tobacco: Never Used  . Alcohol use No  . Drug use: No   Social History Narrative   Separated single mom   HH of 4   Works on  computer   Been taking care of 66-year-old grandchild   Many stresses with children at home   Son and gs moved back in    3 dogs.    Current Outpatient Prescriptions on File Prior to Visit  Medication Sig Dispense Refill  . FREESTYLE UNISTICK II LANCETS MISC Use to check blood sugar 1-2 times daily 100 each 3  . glucose blood (FREESTYLE LITE) test strip Use as instructed 100 each 0  . Glucose Blood (FREESTYLE TEST VI) by In Vitro route.      Marland Kitchen glucose blood test strip Use to check blood sugar 1-2 times daily 100 each 3  . losartan (COZAAR) 50 MG tablet TAKE 1 TABLET (50 MG TOTAL) BY MOUTH DAILY. 30 tablet 2  . pioglitazone (ACTOS) 15 MG tablet TAKE 1 TABLET  (15 MG TOTAL) BY MOUTH DAILY. 30 tablet 3  . atorvastatin (LIPITOR) 20 MG tablet Take 1 tablet (20 mg total) by mouth daily. (Patient not taking: Reported on 03/03/2016) 90 tablet 1   No current facility-administered medications on file prior to visit.    Allergies  Allergen Reactions  . Naproxen     REACTION: Dizziness and nausea, slowed down pt's reaction time  . Simvastatin     Joint Pain   Family History  Problem Relation Age of Onset  . Acute myelogenous leukemia Mother     died when pat age 73   . Diabetes Father   . Diabetes Paternal Grandfather    PE: BP 124/74 (BP Location: Left Arm, Patient Position: Sitting)   Pulse 100   Ht 5\' 4"  (1.626 m)   Wt 187 lb (84.8 kg)   SpO2 98%   BMI 32.10 kg/m   Wt Readings from Last 3 Encounters:  03/03/16 187 lb (84.8 kg)  01/18/16 191 lb (86.6 kg)  07/27/15 191 lb 1.6 oz (86.7 kg)   Constitutional: overweight, in NAD Eyes: PERRLA, EOMI, no exophthalmos ENT: moist mucous membranes, no thyromegaly, no cervical lymphadenopathy Cardiovascular: RRR, No MRG Respiratory: CTA B Gastrointestinal: abdomen soft, NT, ND, BS+ Musculoskeletal: no deformities, strength intact in all 4 Skin: moist, warm, no rashes Neurological: no tremor with outstretched hands, DTR normal in all 4  ASSESSMENT: 1. DM2, non-insulin-dependent, uncontrolled, without long term complications, but with hyperglycemia  PLAN:  1. Patient with long-standing, uncontrolled diabetes, on oral antidiabetic regimen, which became insufficient. She is not checking sugars for now, but her sugars stay between 200-300 whenever she checked. I strongly advised her to change the battery in her meter and start checking her sugars again - check 2 times a day, rotating checks. - We discussed about the need to start gaining control of her diabetes. It is difficult to recommend a change in regimen without CBG data, however, I advised her for now to increase her metformin to target dose of  2000 mg daily, also to increase glimepiride to twice a day and also to add Victoza, which she used in the past and tolerated well. In 1.5 months, if this regimen is not helping, we'll need to start insulin. - I also strongly advised her to start changing her diet. I suggested to eliminate concentrated sweets and saturated fats including fried foods but also to taper down her diet sodas. I also suggested almond milk rather than 1% milk. - I suggested to:  Patient Instructions  Please increase  - Metformin to 1000 mg 2x a day - Glimepiride to 4 mg before b'fast and dinner  Please start: - Victoza  0.6 mg daily before b'fast for 4 days, then increase to 1.2 mg daily for 4 days, then increase to 1.8 mg daily in am  Please return in 1.5 months with your sugar log.   - given sugar log and advised how to fill it and to bring it at next appt  - given foot care handout and explained the principles  - given instructions for hypoglycemia management "15-15 rule"  - advised for yearly eye exams >> She needs one - Return to clinic in 1.5 mo with sugar log   Philemon Kingdom, MD PhD [facility] Endocrinology

## 2016-03-03 NOTE — Patient Instructions (Signed)
Please increase  - Metformin to 1000 mg 2x a day - Glimepiride to 4 mg before b'fast and dinner  Please start: - Victoza 0.6 mg daily before b'fast for 4 days, then increase to 1.2 mg daily for 4 days, then increase to 1.8 mg daily in am  Please return in 1.5 months with your sugar log.   PATIENT INSTRUCTIONS FOR TYPE 2 DIABETES:  **Please join MyChart!** - see attached instructions about how to join if you have not done so already.  DIET AND EXERCISE Diet and exercise is an important part of diabetic treatment.  We recommended aerobic exercise in the form of brisk walking (working between 40-60% of maximal aerobic capacity, similar to brisk walking) for 150 minutes per week (such as 30 minutes five days per week) along with 3 times per week performing 'resistance' training (using various gauge rubber tubes with handles) 5-10 exercises involving the major muscle groups (upper body, lower body and core) performing 10-15 repetitions (or near fatigue) each exercise. Start at half the above goal but build slowly to reach the above goals. If limited by weight, joint pain, or disability, we recommend daily walking in a swimming pool with water up to waist to reduce pressure from joints while allow for adequate exercise.    BLOOD GLUCOSES Monitoring your blood glucoses is important for continued management of your diabetes. Please check your blood glucoses 2-4 times a day: fasting, before meals and at bedtime (you can rotate these measurements - e.g. one day check before the 3 meals, the next day check before 2 of the meals and before bedtime, etc.).   HYPOGLYCEMIA (low blood sugar) Hypoglycemia is usually a reaction to not eating, exercising, or taking too much insulin/ other diabetes drugs.  Symptoms include tremors, sweating, hunger, confusion, headache, etc. Treat IMMEDIATELY with 15 grams of Carbs: . 4 glucose tablets .  cup regular juice/soda . 2 tablespoons raisins . 4 teaspoons  sugar . 1 tablespoon honey Recheck blood glucose in 15 mins and repeat above if still symptomatic/blood glucose <100.  RECOMMENDATIONS TO REDUCE YOUR RISK OF DIABETIC COMPLICATIONS: * Take your prescribed MEDICATION(S) * Follow a DIABETIC diet: Complex carbs, fiber rich foods, (monounsaturated and polyunsaturated) fats * AVOID saturated/trans fats, high fat foods, >2,300 mg salt per day. * EXERCISE at least 5 times a week for 30 minutes or preferably daily.  * DO NOT SMOKE OR DRINK more than 1 drink a day. * Check your FEET every day. Do not wear tightfitting shoes. Contact us if you develop an ulcer * See your EYE doctor once a year or more if needed * Get a FLU shot once a year * Get a PNEUMONIA vaccine once before and once after age 72 years  GOALS:  * Your Hemoglobin A1c of <7%  * fasting sugars need to be <130 * after meals sugars need to be <180 (2h after you start eating) * Your Systolic BP should be [id number] or lower  * Your Diastolic BP should be 80 or lower  * Your HDL (Good Cholesterol) should be 40 or higher  * Your LDL (Bad Cholesterol) should be 100 or lower. * Your Triglycerides should be 150 or lower  * Your Urine microalbumin (kidney function) should be <30 * Your Body Mass Index should be 25 or lower    Please consider the following ways to cut down carbs and fat and increase fiber and micronutrients in your diet: - substitute whole grain for white bread or pasta -  substitute brown rice for white rice - substitute 90-calorie flat bread pieces for slices of bread when possible - substitute sweet potatoes or yams for white potatoes - substitute humus for margarine - substitute tofu for cheese when possible - substitute almond or rice milk for regular milk (would not drink soy milk daily due to concern for soy estrogen influence on breast cancer risk) - substitute dark chocolate for other sweets when possible - substitute water - can add lemon or orange slices for taste  - for diet sodas (artificial sweeteners will trick your body that you can eat sweets without getting calories and will lead you to overeating and weight gain in the long run) - do not skip breakfast or other meals (this will slow down the metabolism and will result in more weight gain over time)  - can try smoothies made from fruit and almond/rice milk in am instead of regular breakfast - can also try old-fashioned (not instant) oatmeal made with almond/rice milk in am - order the dressing on the side when eating salad at a restaurant (pour less than half of the dressing on the salad) - eat as little meat as possible - can try juicing, but should not forget that juicing will get rid of the fiber, so would alternate with eating raw veg./fruits or drinking smoothies - use as little oil as possible, even when using olive oil - can dress a salad with a mix of balsamic vinegar and lemon juice, for e.g. - use agave nectar, stevia sugar, or regular sugar rather than artificial sweateners - steam or broil/roast veggies  - snack on veggies/fruit/nuts (unsalted, preferably) when possible, rather than processed foods - reduce or eliminate aspartame in diet (it is in diet sodas, chewing gum, etc) Read the labels!  Try to read Dr. Janene Harvey book: "Program for Reversing Diabetes" for other ideas for healthy eating.

## 2016-03-13 ENCOUNTER — Other Ambulatory Visit: Payer: Self-pay

## 2016-03-31 ENCOUNTER — Telehealth: Payer: Self-pay | Admitting: Internal Medicine

## 2016-03-31 NOTE — Telephone Encounter (Signed)
Called [facility], discussed the issues, PA was submitted and faxed. They stated they will follow up with the plan.

## 2016-03-31 NOTE — Telephone Encounter (Signed)
[facility] called and requests a call back regarding this patient's prescription, CB# [phone] REF# [id number]

## 2016-04-08 ENCOUNTER — Other Ambulatory Visit: Payer: Self-pay | Admitting: Internal Medicine

## 2016-04-09 NOTE — Telephone Encounter (Signed)
Sent to the pharmacy by e-scribe for 2 months.  Pt has upcoming cpx on 05/29/16.

## 2016-04-24 ENCOUNTER — Ambulatory Visit: Payer: 59 | Admitting: Internal Medicine

## 2016-05-19 ENCOUNTER — Other Ambulatory Visit: Payer: Self-pay | Admitting: Family Medicine

## 2016-05-19 DIAGNOSIS — I10 Essential (primary) hypertension: Secondary | ICD-10-CM

## 2016-05-19 DIAGNOSIS — E559 Vitamin D deficiency, unspecified: Secondary | ICD-10-CM

## 2016-05-19 DIAGNOSIS — E785 Hyperlipidemia, unspecified: Secondary | ICD-10-CM

## 2016-05-19 DIAGNOSIS — E1165 Type 2 diabetes mellitus with hyperglycemia: Secondary | ICD-10-CM

## 2016-05-20 ENCOUNTER — Other Ambulatory Visit: Payer: 59

## 2016-05-27 ENCOUNTER — Encounter: Payer: 59 | Admitting: Internal Medicine

## 2016-06-20 ENCOUNTER — Ambulatory Visit: Payer: 59 | Admitting: Internal Medicine

## 2016-07-03 ENCOUNTER — Ambulatory Visit: Payer: 59 | Admitting: Internal Medicine

## 2016-07-03 DIAGNOSIS — Z0289 Encounter for other administrative examinations: Secondary | ICD-10-CM

## 2016-07-21 ENCOUNTER — Encounter: Payer: 59 | Admitting: Internal Medicine

## 2016-08-14 ENCOUNTER — Other Ambulatory Visit: Payer: Self-pay | Admitting: Family Medicine

## 2016-08-14 MED ORDER — LOSARTAN POTASSIUM 50 MG PO TABS: ORAL_TABLET | ORAL | 0 refills | Status: DC

## 2016-08-14 NOTE — Telephone Encounter (Signed)
Received fax from the pharmacy.  Wish to change to a 90 day supply

## 2016-10-01 LAB — HM DIABETES EYE EXAM

## 2016-10-08 ENCOUNTER — Encounter: Payer: Self-pay | Admitting: Internal Medicine

## 2016-10-15 ENCOUNTER — Ambulatory Visit: Payer: 59 | Admitting: Internal Medicine

## 2016-11-17 ENCOUNTER — Other Ambulatory Visit: Payer: Self-pay

## 2016-11-17 MED ORDER — INSULIN PEN NEEDLE 32G X 4 MM MISC: 5 refills | Status: DC

## 2016-12-11 ENCOUNTER — Ambulatory Visit: Payer: 59 | Admitting: Internal Medicine

## 2017-02-17 ENCOUNTER — Ambulatory Visit (INDEPENDENT_AMBULATORY_CARE_PROVIDER_SITE_OTHER): Payer: 59 | Admitting: Internal Medicine

## 2017-02-17 ENCOUNTER — Encounter: Payer: Self-pay | Admitting: Internal Medicine

## 2017-02-17 VITALS — BP 160/90 | HR 125 | Ht 64.0 in | Wt 187.0 lb

## 2017-02-17 DIAGNOSIS — E1165 Type 2 diabetes mellitus with hyperglycemia: Secondary | ICD-10-CM | POA: Diagnosis not present

## 2017-02-17 MED ORDER — METFORMIN HCL 1000 MG PO TABS: ORAL_TABLET | ORAL | 3 refills | Status: DC

## 2017-02-17 MED ORDER — LIRAGLUTIDE 18 MG/3ML ~~LOC~~ SOPN: PEN_INJECTOR | SUBCUTANEOUS | 5 refills | Status: DC

## 2017-02-17 MED ORDER — GLIMEPIRIDE 4 MG PO TABS: 4.0000 mg | ORAL_TABLET | Freq: Two times a day (BID) | ORAL | 3 refills | Status: DC

## 2017-02-17 MED ORDER — PIOGLITAZONE HCL 15 MG PO TABS: ORAL_TABLET | ORAL | 3 refills | Status: DC

## 2017-02-17 NOTE — Progress Notes (Signed)
Patient ID: [name] [name], female   DOB: 01/04/1951, 67 y.o.   MRN: [id number]   HPI: [name] [name] is a 67 y.o.-year-old female, returning for follow-up for DM2, dx in 2004, non-insulin-dependent, uncontrolled, without long term complications.  Last visit 1 year ago.  Since last visit, she came off Victoza but she ran out during the summer. It was working well after we started.   Last hemoglobin A1c was: Lab Results  Component Value Date   HGBA1C 11.0 01/18/2016   HGBA1C 10.5 (H) 06/12/2015   HGBA1C 10.1 (H) 08/01/2014   HGBA1C 9.2 (H) 09/15/2013   HGBA1C 8.1 (H) 09/24/2012   HGBA1C 8.2 (H) 06/11/2012   HGBA1C 7.1 (H) 10/09/2011   HGBA1C 9.6 (H) 05/06/2011   HGBA1C 7.7 (H) 01/01/2011   HGBA1C 8.6 (H) 07/31/2010   Pt is on a regimen of: - Metformin 1000 mg 2x a day - may forget evening dose - Glimepiride 4 mg before b'fast and before dinner - may forget evening dose  She did not use Trulicity b/c afraid of GI SEs.   Pt checks her sugars 0 to 1x a day: - am: n/c >> 200-240 - 2h after b'fast: n/c - before lunch: n/c - 2h after lunch: n/c - before dinner: n/c - 2h after dinner: n/c >> 200-240 - bedtime: n/c - nighttime: n/c Lowest sugar was 200 >> 100s; ? hypoglycemia awareness. Highest sugar was 300s >> 200s.  Glucometer: Freestyle lite  Pt's meals are: - Breakfast: Hot cakes or cereal - Lunch: Hot dog, spaghetti, Burgers, tacos - Dinner: Pasta, baked chicken, salad, mashed potatoes - Snacks: drinks 4-5 diet cokes a day!!!; Nuts, granola bars, cookies  -No CKD, last BUN/creatinine:  Lab Results  Component Value Date   BUN 10 06/12/2015   BUN 12 08/01/2014   CREATININE 0.63 06/12/2015   CREATININE 0.62 08/01/2014  On L losartan - + HL; last set of lipids: Lab Results  Component Value Date   CHOL 236 (H) 08/01/2014   HDL 56.10 08/01/2014   LDLCALC 73 10/09/2011   LDLDIRECT 134.0 08/01/2014   TRIG 262.0 (H) 08/01/2014   CHOLHDL 4 08/01/2014  Off Lipitor  as she had joint pains while on it. - last dilated eye exam was in 2009 >> No DR  - Denies  numbness and tingling in her feet.  Pt has FH of DM in father, PGM.  ROS: Constitutional: no weight gain/no weight loss, no fatigue, no subjective hyperthermia, no subjective hypothermia Eyes: no blurry vision, no xerophthalmia ENT: no sore throat, no nodules palpated in throat, no dysphagia, no odynophagia, no hoarseness Cardiovascular: no CP/no SOB/no palpitations/no leg swelling Respiratory: + cough/no SOB/no wheezing Gastrointestinal: no N/no V/+ D/no C/no acid reflux Musculoskeletal: no muscle aches/+ joint aches, also sciatica pain - L Skin: no rashes, no hair loss Neurological: no tremors/no numbness/no tingling/no dizziness  I reviewed pt's medications, allergies, PMH, social hx, family hx, and changes were documented in the history of present illness. Otherwise, unchanged from my initial visit note.  Past Medical History:  Diagnosis Date  . Allergic rhinitis   . Diabetes mellitus   . Hyperlipidemia   . Hypertension   . IBS (irritable bowel syndrome)   . Normal nuclear stress test 1 /08   low risk study   . Sciatica    Past Surgical History:  Procedure Laterality Date  . CESAREAN SECTION     x 3; 17,00,17  . NM MYOVIEW LTD  02/2006   low risk study  Social History   Social History Main Topics  . Smoking status: Never Smoker  . Smokeless tobacco: Never Used  . Alcohol use No  . Drug use: No   Social History Narrative   Separated single mom   HH of 4   Works on Teaching laboratory technician   Been taking care of 65-year-old grandchild   Many stresses with children at home   Son and gs moved back in    3 dogs.    Current Outpatient Medications on File Prior to Visit  Medication Sig Dispense Refill  . atorvastatin (LIPITOR) 20 MG tablet Take 1 tablet (20 mg total) by mouth daily. 90 tablet 1  . FREESTYLE UNISTICK II LANCETS MISC Use to check blood sugar 1-2 times daily 100 each 3  .  glimepiride (AMARYL) 4 MG tablet Take 1 tablet (4 mg total) by mouth 2 (two) times daily before a meal. 180 tablet 3  . glucose blood (FREESTYLE LITE) test strip Use as instructed 100 each 0  . Glucose Blood (FREESTYLE TEST VI) by In Vitro route.      Marland Kitchen glucose blood test strip Use to check blood sugar 1-2 times daily 100 each 3  . Insulin Pen Needle (CAREFINE PEN NEEDLES) 32G X 4 MM MISC Use to inject insulin daily 100 each 5  . liraglutide (VICTOZA) 18 MG/3ML SOPN Inject 1.8 mg SUBQ once daily; 3 pen 5  . metFORMIN (GLUCOPHAGE) 1000 MG tablet TAKE 1 TABLET (1000 MG TOTAL) BY MOUTH 2 (TWO) TIMES DAILY WITH A MEAL. 180 tablet 3  . pioglitazone (ACTOS) 15 MG tablet TAKE 1 TABLET (15 MG TOTAL) BY MOUTH DAILY. (Patient not taking: Reported on 02/17/2017) 30 tablet 3   No current facility-administered medications on file prior to visit.    Allergies  Allergen Reactions  . Naproxen     REACTION: Dizziness and nausea, slowed down pt's reaction time  . Simvastatin     Joint Pain   Family History  Problem Relation Age of Onset  . Acute myelogenous leukemia Mother        died when pat age 18   . Diabetes Father   . Diabetes Paternal Grandfather    PE: BP (!) 160/90   Pulse (!) 125   Ht 5\' 4"  (1.626 m)   Wt 187 lb (84.8 kg)   SpO2 98%   BMI 32.10 kg/m  Wt Readings from Last 3 Encounters:  02/17/17 187 lb (84.8 kg)  03/03/16 187 lb (84.8 kg)  01/18/16 191 lb (86.6 kg)   Constitutional: overweight, in NAD Eyes: PERRLA, EOMI, no exophthalmos ENT: moist mucous membranes, no thyromegaly, no cervical lymphadenopathy Cardiovascular: Tachycardia, RR, No MRG Respiratory: CTA B Gastrointestinal: abdomen soft, NT, ND, BS+ Musculoskeletal: no deformities, strength intact in all 4 Skin: moist, warm, no rashes Neurological: no tremor with outstretched hands, DTR normal in all 4   ASSESSMENT: 1. DM2, non-insulin-dependent, uncontrolled, without long term complications, but with  hyperglycemia  PLAN:  1. Patient with long-standing, very uncontrolled diabetes, returning after a year absence.  Her sugars are very high in the recent HbA1c was also very high.  She tells me that she had a rough year but is determined to gain control of her diabetes in the new year. - reviewed together last HbA1c, which was very high, at 11% last mo >> I suggested insulin >> refuses >> would like to retry Victoza for 3 mo. I refilled this for her and also refilled Actos >> she has been off  this recently as she ran out. We also discussed about strategies to not forget the second Glimepiride and Metformin doses of the day  - strongly advised her to go directly home and drink plenty of water as she appear very dehydrated today (Pulse 125!) - I suggested to:  Patient Instructions  Please continue: - Metformin 1000 mg 2x a day - Glimepiride 4 mg before b'fast and before dinner - Actos 15 mg daily  Please start: - Victoza 0.6 mg daily before b'fast for 4 days, then increase to 1.2 mg daily for 4 days, then increase to 1.8 mg daily in am  Please return in 3 months with your sugar log.   - continue checking sugars at different times of the day - check 1x a day, rotating checks - advised for yearly eye exams >> she is due - Return to clinic in 3 mo with sugar log   - time spent with the patient: 25 min, of which >50% was spent in reviewing her values, discussing her hyper-glycemic episodes, reviewing  previous labs and insulin doses and developing a plan to avoid hypo- and hyper-glycemia.   Philemon Kingdom, MD PhD [facility] Endocrinology

## 2017-02-17 NOTE — Patient Instructions (Addendum)
Please continue: - Metformin 1000 mg 2x a day - Glimepiride 4 mg before b'fast and before dinner - Actos 15 mg daily  Please start: - Victoza 0.6 mg daily before b'fast for 4 days, then increase to 1.2 mg daily for 4 days, then increase to 1.8 mg daily in am  Please return in 3 months with your sugar log.

## 2017-03-07 ENCOUNTER — Other Ambulatory Visit: Payer: Self-pay | Admitting: Internal Medicine

## 2017-03-23 ENCOUNTER — Telehealth: Payer: Self-pay | Admitting: Internal Medicine

## 2017-03-23 MED ORDER — LOSARTAN POTASSIUM 50 MG PO TABS: ORAL_TABLET | ORAL | 0 refills | Status: DC

## 2017-03-23 NOTE — Telephone Encounter (Signed)
1 month supply sent. Pt needs OV for labs and annual visit.

## 2017-03-23 NOTE — Telephone Encounter (Signed)
Copied from [place]. Topic: Quick Communication - See Telephone Encounter >> Mar 23, 2017  9:44 AM Boyd Kerbs wrote: CRM for notification. See Telephone encounter for:   [name] [name] from [facility] [phone] requesting 90 day refill for losartan (COZAAR) 50 MG tablet    Has tries to fax this for 5 days, would not go through, verified fax  03/23/17.

## 2017-03-23 NOTE — Telephone Encounter (Signed)
Losartan refill Last OV: 01/18/2016; no upcoming appointments w/Panosh; seen several times by enocrinologist Last Refill:03/09/2017 30 tab/0 refill [facility]acy[facility] [phone] Pharmacy Requesting 90 day refill

## 2017-03-23 NOTE — Addendum Note (Signed)
Addended by: Virl Cagey on: 03/23/2017 04:31 PM   Modules accepted: Orders

## 2017-03-25 NOTE — Telephone Encounter (Signed)
ATC, no voicemail set up, WCB  Note added to Rx when called in stating the need for OV and labs before meds will be refilled again.

## 2017-05-11 ENCOUNTER — Other Ambulatory Visit: Payer: Self-pay | Admitting: *Deleted

## 2017-05-11 MED ORDER — LOSARTAN POTASSIUM 50 MG PO TABS: ORAL_TABLET | ORAL | 0 refills | Status: DC

## 2017-05-18 ENCOUNTER — Ambulatory Visit: Payer: 59 | Admitting: Internal Medicine

## 2017-06-10 ENCOUNTER — Other Ambulatory Visit: Payer: Self-pay | Admitting: Internal Medicine

## 2017-06-22 DIAGNOSIS — H40053 Ocular hypertension, bilateral: Secondary | ICD-10-CM | POA: Diagnosis not present

## 2017-07-23 ENCOUNTER — Encounter: Payer: Self-pay | Admitting: Internal Medicine

## 2017-07-23 ENCOUNTER — Ambulatory Visit: Payer: 59 | Admitting: Internal Medicine

## 2017-07-23 VITALS — BP 158/80 | HR 117 | Ht 64.0 in | Wt 178.6 lb

## 2017-07-23 DIAGNOSIS — E785 Hyperlipidemia, unspecified: Secondary | ICD-10-CM

## 2017-07-23 DIAGNOSIS — E1165 Type 2 diabetes mellitus with hyperglycemia: Secondary | ICD-10-CM

## 2017-07-23 LAB — POCT GLYCOSYLATED HEMOGLOBIN (HGB A1C): HEMOGLOBIN A1C: 9.8 % — AB (ref 4.0–5.6)

## 2017-07-23 MED ORDER — DULAGLUTIDE 1.5 MG/0.5ML ~~LOC~~ SOAJ: 1.5000 mg | SUBCUTANEOUS | 11 refills | Status: DC

## 2017-07-23 MED ORDER — FREESTYLE UNISTICK II LANCETS MISC: 3 refills | Status: DC

## 2017-07-23 NOTE — Addendum Note (Signed)
Addended by: Drucilla Schmidt on: 07/23/2017 03:17 PM   Modules accepted: Orders

## 2017-07-23 NOTE — Patient Instructions (Addendum)
Please continue: - Metformin 1000 mg 2x a day - Glimepiride 4 mg before b'fast and before dinner - Actos 15 mg daily  Stop Victoza and start Trulicity 1.5 mg weekly.  Can try the following combination for neuropathy: - alpha-lipoic acid 600 mg 2x a day - B complex 1 tablet a day  Please return in 3 months with your sugar log.

## 2017-07-23 NOTE — Progress Notes (Signed)
Patient ID: [name] [name], female   DOB: 1950/10/05, 67 y.o.   MRN: [id number]   HPI: [name] [name] is a 67 y.o.-year-old female, returning for follow-up for DM2, dx in 2004, non-insulin-dependent, uncontrolled, without long term complications.  Last visit 5 months ago.  Last hemoglobin A1c was: Lab Results  Component Value Date   HGBA1C 11.0 01/18/2016   HGBA1C 10.5 (H) 06/12/2015   HGBA1C 10.1 (H) 08/01/2014   HGBA1C 9.2 (H) 09/15/2013   HGBA1C 8.1 (H) 09/24/2012   HGBA1C 8.2 (H) 06/11/2012   HGBA1C 7.1 (H) 10/09/2011   HGBA1C 9.6 (H) 05/06/2011   HGBA1C 7.7 (H) 01/01/2011   HGBA1C 8.6 (H) 07/31/2010   Pt is on a regimen of: - Metformin 1000 mg 2x a day - sometimes forgets (at least 1-2 times a week) - Glimepiride 4 mg before b'fast and before dinner  - sometimes forgets  (at least 1-2 times a week) - Actos 15 mg daily - Victoza 1.8 mg daily in am - sometimes forgets (at least 1-2 times a week) She did not use Trulicity b/c afraid of GI SEs.   Pt checks her sugars 0-1 a day: - am: n/c >> 200-240 >> 190 - 2h after b'fast: n/c >> >250 - before lunch: n/c - 2h after lunch: n/c - before dinner: n/c - 2h after dinner: n/c >> 200-240 >> >250 - bedtime: n/c - nighttime: n/c Lowest sugar was 200 >> 100s >> 180; ?  It is unclear at which level she has hypoglycemia awareness. Highest sugar was 300s >> 200s >> 300.  Glucometer: Freestyle lite  Pt's meals are: - Breakfast: Hot cakes or cereal - Lunch: Hot dog, spaghetti, Burgers, tacos - Dinner: Pasta, baked chicken, salad, mashed potatoes - Snacks: drinks 4-5 diet cokes a day!; Nuts, granola bars, cookies  -  no CKD, last BUN/creatinine:  Lab Results  Component Value Date   BUN 10 06/12/2015   BUN 12 08/01/2014   CREATININE 0.63 06/12/2015   CREATININE 0.62 08/01/2014  On losartan. - + HL; last set of lipids: Lab Results  Component Value Date   CHOL 236 (H) 08/01/2014   HDL 56.10 08/01/2014   LDLCALC 73 10/09/2011    LDLDIRECT 134.0 08/01/2014   TRIG 262.0 (H) 08/01/2014   CHOLHDL 4 08/01/2014  Off Lipitor due to joint pain - last dilated eye exam was in 06/2017: No DR - no numbness and tingling in her feet.  Pt has FH of DM in father, PGM.  ROS: Constitutional: no weight gain/no weight loss, no fatigue, no subjective hyperthermia, no subjective hypothermia Eyes: no blurry vision, no xerophthalmia ENT: no sore throat, no nodules palpated in throat, no dysphagia, no odynophagia, no hoarseness Cardiovascular: no CP/no SOB/no palpitations/no leg swelling Respiratory: no cough/no SOB/no wheezing Gastrointestinal: no N/no V/no D/no C/+ acid reflux Musculoskeletal: + muscle aches/no joint aches Skin: no rashes, no hair loss Neurological: no tremors/no numbness/no tingling/no dizziness  I reviewed pt's medications, allergies, PMH, social hx, family hx, and changes were documented in the history of present illness. Otherwise, unchanged from my initial visit note.  Past Medical History:  Diagnosis Date  . Allergic rhinitis   . Diabetes mellitus   . Hyperlipidemia   . Hypertension   . IBS (irritable bowel syndrome)   . Normal nuclear stress test 1 /08   low risk study   . Sciatica    Past Surgical History:  Procedure Laterality Date  . CESAREAN SECTION     x 3; 27,06,23  .  NM MYOVIEW LTD  02/2006   low risk study   Social History   Social History Main Topics  . Smoking status: Never Smoker  . Smokeless tobacco: Never Used  . Alcohol use No  . Drug use: No   Social History Narrative   Separated single mom   HH of 4   Works on Teaching laboratory technician   Been taking care of 56-year-old grandchild   Many stresses with children at home   Son and gs moved back in    3 dogs.    Current Outpatient Medications on File Prior to Visit  Medication Sig Dispense Refill  . atorvastatin (LIPITOR) 20 MG tablet Take 1 tablet (20 mg total) by mouth daily. 90 tablet 1  . FREESTYLE UNISTICK II LANCETS MISC Use to  check blood sugar 1-2 times daily 100 each 3  . glimepiride (AMARYL) 4 MG tablet Take 1 tablet (4 mg total) by mouth 2 (two) times daily before a meal. 180 tablet 3  . glucose blood (FREESTYLE LITE) test strip Use as instructed 100 each 0  . Glucose Blood (FREESTYLE TEST VI) by In Vitro route.      Marland Kitchen glucose blood test strip Use to check blood sugar 1-2 times daily 100 each 3  . Insulin Pen Needle (CAREFINE PEN NEEDLES) 32G X 4 MM MISC Use to inject insulin daily 100 each 5  . liraglutide (VICTOZA) 18 MG/3ML SOPN Inject 1.8 mg SUBQ once daily; 3 pen 5  . losartan (COZAAR) 50 MG tablet TAKE 1 TABLET BY MOUTH EVERY DAY **DUE FOR YEARLY EXAM/LABS. NO FURTHER REFILLS. 30 tablet 0  . metFORMIN (GLUCOPHAGE) 1000 MG tablet TAKE 1 TABLET (1000 MG TOTAL) BY MOUTH 2 (TWO) TIMES DAILY WITH A MEAL. 180 tablet 3  . pioglitazone (ACTOS) 15 MG tablet TAKE 1 TABLET (15 MG TOTAL) BY MOUTH DAILY. 90 tablet 3   No current facility-administered medications on file prior to visit.    Allergies  Allergen Reactions  . Naproxen     REACTION: Dizziness and nausea, slowed down pt's reaction time  . Simvastatin     Joint Pain   Family History  Problem Relation Age of Onset  . Acute myelogenous leukemia Mother        died when pat age 50   . Diabetes Father   . Diabetes Paternal Grandfather    PE: BP (!) 158/80   Pulse (!) 117 Comment: pt stated she was rushing to get here  Ht 5\' 4"  (1.626 m)   Wt 178 lb 9.6 oz (81 kg)   SpO2 97%   BMI 30.66 kg/m  Wt Readings from Last 3 Encounters:  07/23/17 178 lb 9.6 oz (81 kg)  02/17/17 187 lb (84.8 kg)  03/03/16 187 lb (84.8 kg)   Constitutional: overweight, in NAD Eyes: PERRLA, EOMI, no exophthalmos ENT: moist mucous membranes, no thyromegaly, no cervical lymphadenopathy Cardiovascular:  tachycardia,RR, No MRG Respiratory: CTA B Gastrointestinal: abdomen soft, NT, ND, BS+ Musculoskeletal: no deformities, strength intact in all 4 Skin: moist, warm, no  rashes Neurological: no tremor with outstretched hands, DTR normal in all 4  ASSESSMENT: 1. DM2, non-insulin-dependent, uncontrolled, without long term complications, but with hyperglycemia  PLAN:  1. Patient with long-standing, very uncontrolled diabetes, with very high sugars at last visit, after which we added a GLP-1 receptor agonist to metformin, glipizide, and Actos.  Before last visit, she ran out of Actos.  She was telling me at that time that she is determined to start  gaining control of her diabetes.  She refused insulin at that time, wanting to try Victoza first.  -At this visit, sugars are little better, but still very high.  I again suggested insulin which she again refuses. -She is missing many medication doses as she forgets them especially at night, but also Victoza in the morning.  She is interested in switching to Trulicity which is only administered once a week.  We will make this change, but we discussed that if her sugars do not improve after this and after she hopefully starts taking her medications consistently, we absolutely need to go to insulin. - I suggested to:  Patient Instructions  Please continue: - Metformin 1000 mg 2x a day - Glimepiride 4 mg before b'fast and before dinner - Actos 15 mg daily  Stop Victoza and start Trulicity 1.5 mg weekly.  Can try the following combination for neuropathy: - alpha-lipoic acid 600 mg 2x a day - B complex 1 tablet a day  Please return in 3 months with your sugar log.   - today, HbA1c is 9.8% (better) - continue checking sugars at different times of the day - check 1-2x a day, rotating checks - advised for yearly eye exams >> she is UTD - Return to clinic in 3 mo with sugar log   2. HL - Reviewed latest lipid panel from 2016: LDL higher, above goal - Continues off the statin due to previous side effects. - She would like to have an appointment with PCP to have this checked, would not like to have them checked  today  Philemon Kingdom, MD PhD [facility] Endocrinology

## 2017-10-08 ENCOUNTER — Other Ambulatory Visit: Payer: Self-pay | Admitting: Internal Medicine

## 2017-10-08 DIAGNOSIS — Z1231 Encounter for screening mammogram for malignant neoplasm of breast: Secondary | ICD-10-CM

## 2017-10-26 ENCOUNTER — Ambulatory Visit: Payer: 59 | Admitting: Internal Medicine

## 2017-11-09 ENCOUNTER — Ambulatory Visit
Admission: RE | Admit: 2017-11-09 | Discharge: 2017-11-09 | Disposition: A | Discharge status: Home / Self Care | Payer: BLUE CROSS/BLUE SHIELD | Source: Ambulatory Visit | Attending: Internal Medicine | Admitting: Internal Medicine

## 2017-11-09 DIAGNOSIS — Z1231 Encounter for screening mammogram for malignant neoplasm of breast: Secondary | ICD-10-CM | POA: Diagnosis not present

## 2017-11-25 ENCOUNTER — Ambulatory Visit: Payer: Self-pay | Admitting: Internal Medicine

## 2017-11-27 ENCOUNTER — Other Ambulatory Visit: Payer: Self-pay | Admitting: Internal Medicine

## 2017-11-27 NOTE — Telephone Encounter (Signed)
Copied from [place] [id number]. Topic: Quick Communication - Rx Refill/Question >> Nov 27, 2017 10:26 AM Margot Ables wrote: Medication: losartan (COZAAR) 50 MG tablet - pt out - appt scheduled 12/08/17 with Dr. Regis Bill  Has the patient contacted their pharmacy? Yes - no refills Preferred Pharmacy (with phone number or street name): [facility] [place] [place] [facility] [place] [phone] (Phone) [phone] (Fax)

## 2017-11-27 NOTE — Telephone Encounter (Signed)
Requested medication (s) are due for refill today: yes  Requested medication (s) are on the active medication list: yes  Last refill:  06/10/17 # 30  0 refills  Future visit scheduled: yes 12/08/17  Notes to clinic:  Note states no refills until office visit needs labs    Requested Prescriptions  Pending Prescriptions Disp Refills   losartan (COZAAR) 50 MG tablet 30 tablet 0     Cardiovascular:  Angiotensin Receptor Blockers Failed - 11/27/2017 10:33 AM      Failed - Cr in normal range and within 180 days    Creatinine, Ser  Date Value Ref Range Status  06/12/2015 0.63 0.40 - 1.20 mg/dL Final         Failed - K in normal range and within 180 days    Potassium  Date Value Ref Range Status  06/12/2015 4.0 3.5 - 5.1 mEq/L Final         Failed - Last BP in normal range    BP Readings from Last 1 Encounters:  07/23/17 (!) 158/80         Failed - Valid encounter within last 6 months    Recent Outpatient Visits          1 year ago Uncontrolled type 2 diabetes mellitus with complication, without long-term current use of insulin ([place])   [facility], Standley Brooking, MD   2 years ago Abscess of face   [place] at [facility], Standley Brooking, MD   2 years ago Abscess of face   [place] at [facility], Alinda Sierras, MD   2 years ago Uncontrolled type 2 diabetes mellitus without complication, without long-term current use of insulin ([place])   [facility] at [facility], Standley Brooking, MD   2 years ago Acute maxillary sinusitis, recurrence not specified   [facility] at [facility], Nickola Major, DO      Future Appointments            In 1 week [facility], Standley Brooking, MD [facility] at [place], [place] - Patient is not pregnant

## 2017-12-07 ENCOUNTER — Ambulatory Visit: Payer: BLUE CROSS/BLUE SHIELD | Admitting: Internal Medicine

## 2017-12-07 NOTE — Progress Notes (Signed)
Chief Complaint  Patient presents with  . Follow-up    last seen 01/2016. Needs refills    HPI: [name] [name] 67 y.o. come in for Chronic disease management  Last seen by me in 12 17 for uncontrolled dm and then  Referred to endocrinology last a1c in record was 9.8  In  June 2019  Here today for   Ran out  A month ago .  Of losartan    bp had been ok   Dm on trulicity but still ffoprgetting to take  But back on recnetly  Has rov dr g in jan   Pain at toes in might .  No claudication  No injury   LIPIDS had se of smva  And maybe not on atorva but not taking dioesnt remember why not taking   Will need refill   Over due for cpx     Working now 4 days per week   Off fri sat sund   lvierw with son at same  Place  ROS: See pertinent positives and negatives per HPI. No cp sob syncope  Vision changes  Past Medical History:  Diagnosis Date  . Allergic rhinitis   . Diabetes mellitus   . Hyperlipidemia   . Hypertension   . IBS (irritable bowel syndrome)   . Normal nuclear stress test 1 /08   low risk study   . Sciatica     Family History  Problem Relation Age of Onset  . Acute myelogenous leukemia Mother        died when pat age 24   . Diabetes Father   . Diabetes Paternal Grandfather   . Breast cancer Neg Hx     Social History   Socioeconomic History  . Marital status: Legally Separated    Spouse name: Not on file  . Number of children: Not on file  . Years of education: Not on file  . Highest education level: Not on file  Occupational History  . Not on file  Social Needs  . Financial resource strain: Not on file  . Food insecurity:    Worry: Not on file    Inability: Not on file  . Transportation needs:    Medical: Not on file    Non-medical: Not on file  Tobacco Use  . Smoking status: Never Smoker  . Smokeless tobacco: Never Used  Substance and Sexual Activity  . Alcohol use: No  . Drug use: No  . Sexual activity: Not on file  Lifestyle  .  Physical activity:    Days per week: Not on file    Minutes per session: Not on file  . Stress: Not on file  Relationships  . Social connections:    Talks on phone: Not on file    Gets together: Not on file    Attends religious service: Not on file    Active member of club or organization: Not on file    Attends meetings of clubs or organizations: Not on file    Relationship status: Not on file  Other Topics Concern  . 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    Works weekend [employer] taking care of 47-year-old grandchild   Many stresses with children at home   Son and gs moved back in  3 dogs.           Outpatient Medications Prior to Visit  Medication Sig Dispense Refill  . atorvastatin (LIPITOR) 20 MG tablet Take 1 tablet (20 mg total) by mouth daily. 90 tablet 1  . Dulaglutide (TRULICITY) 1.5 FY/1.0FB SOPN Inject 1.5 mg into the skin once a week. 4 pen 11  . FREESTYLE UNISTICK II LANCETS MISC Use to check blood sugar 1-2 times daily 100 each 3  . glimepiride (AMARYL) 4 MG tablet Take 1 tablet (4 mg total) by mouth 2 (two) times daily before a meal. 180 tablet 3  . glucose blood (FREESTYLE LITE) test strip Use as instructed 100 each 0  . Glucose Blood (FREESTYLE TEST VI) by In Vitro route.      Marland Kitchen glucose blood test strip Use to check blood sugar 1-2 times daily 100 each 3  . Insulin Pen Needle (CAREFINE PEN NEEDLES) 32G X 4 MM MISC Use to inject insulin daily 100 each 5  . metFORMIN (GLUCOPHAGE) 1000 MG tablet TAKE 1 TABLET (1000 MG TOTAL) BY MOUTH 2 (TWO) TIMES DAILY WITH A MEAL. 180 tablet 3  . pioglitazone (ACTOS) 15 MG tablet TAKE 1 TABLET (15 MG TOTAL) BY MOUTH DAILY. 90 tablet 3  . losartan (COZAAR) 50 MG tablet TAKE 1 TABLET BY MOUTH EVERY DAY **DUE FOR YEARLY EXAM/LABS. NO FURTHER REFILLS. 30 tablet 0   No facility-administered medications prior to visit.       EXAM:  BP 130/86 (BP Location: Right Arm, Patient Position: Sitting, Cuff Size: Normal)   Pulse (!) 108   Temp 97.7 F (36.5 C) (Oral)   Wt 174 lb 8 oz (79.2 kg)   BMI 29.95 kg/m   Body mass index is 29.95 kg/m.  GENERAL: vitals reviewed and listed above, alert, oriented, appears well hydrated and in no acute distress HEENT: atraumatic, conjunctiva  clear, no obvious abnormalities on inspection of external nose and ears NECK: no obvious masses on inspection palpation  LUNGS: clear to auscultation bilaterally, no wheezes, rales or rhonchi, good air movement CV: HRRR, no clubbing cyanosis or  peripheral edema nl cap refill  MS: moves all extremities without noticeable focal  Abnormality ffet no callous  snes intact poss de dp on right  Pt seems intact bilaterally toenaaisl thickened  Diabetic Foot Exam - Simple   Simple Foot Form Diabetic Foot exam was performed with the following findings:  Yes 12/08/2017 10:12 AM  Visual Inspection No deformities, no ulcerations, no other skin breakdown bilaterally:  Yes Sensation Testing Intact to touch and monofilament testing bilaterally:  Yes Pulse Check See comments:  Yes Comments Dec dp on right  Thickened toenails     PSYCH: pleasant and cooperative, no obvious depression or anxiety Lab Results  Component Value Date   WBC 8.7 06/12/2015   HGB 14.9 06/12/2015   HCT 42.6 06/12/2015   PLT 346.0 06/12/2015   GLUCOSE 288 (H) 06/12/2015   CHOL 236 (H) 08/01/2014   TRIG 262.0 (H) 08/01/2014   HDL 56.10 08/01/2014   LDLDIRECT 134.0 08/01/2014   LDLCALC 73 10/09/2011   ALT 14 06/12/2015   AST 14 06/12/2015   NA 135 06/12/2015   K 4.0 06/12/2015   CL 101 06/12/2015   CREATININE 0.63 06/12/2015   BUN 10 06/12/2015   CO2 25 06/12/2015   TSH 1.04 06/12/2015   HGBA1C 9.8 (A) 07/23/2017   MICROALBUR 0.1 09/15/2013   BP Readings from Last 3 Encounters:  12/08/17 130/86  07/23/17 Marland Kitchen)  158/80  02/17/17 (!) 160/90   Wt  Readings from Last 3 Encounters:  12/08/17 174 lb 8 oz (79.2 kg)  07/23/17 178 lb 9.6 oz (81 kg)  02/17/17 187 lb (84.8 kg)    ASSESSMENT AND PLAN:  Discussed the following assessment and plan:  Essential hypertension - Plan: Basic metabolic panel, Hepatic function panel, CBC with Differential/Platelet, Lipid panel, TSH, Microalbumin / creatinine urine ratio, VAS [place] ABI WITH/WO TBI  Need for influenza vaccination - Plan: Flu vaccine HIGH DOSE PF (Fluzone High dose)  Hyperlipidemia, unspecified hyperlipidemia type - Plan: Basic metabolic panel, Hepatic function panel, CBC with Differential/Platelet, Lipid panel, TSH, Microalbumin / creatinine urine ratio  Uncontrolled type 2 diabetes mellitus with complication, without long-term current use of insulin ([facility]) - Plan: Basic metabolic panel, Hepatic function panel, CBC with Differential/Platelet, Lipid panel, TSH, Microalbumin / creatinine urine ratio, VAS [place] ABI WITH/WO TBI  Medication management - Plan: Basic metabolic panel, Hepatic function panel, CBC with Differential/Platelet, Lipid panel, TSH, Microalbumin / creatinine urine ratio  Pain in both feet - at night toes  - Plan: Basic metabolic panel, Hepatic function panel, CBC with Differential/Platelet, Lipid panel, TSH, Microalbumin / creatinine urine ratio, VAS [place] ABI WITH/WO TBI  History of partial adherence to treatment - forgets to take medication  reviewed at length today  about heow to remember and barrieres to memory Counseled. About  How to remember to take meds  Has pill box     Will need to go back on some statin med  Thinks atorva didn't cause a problem will wait until labs back   Feet  prob dm related but dec dp n right   Will check abi  -Patient advised to return or notify health care team  if  new concerns arise. Over due for pv and pap etc   Total visit 42mins > 50% spent counseling and coordinating care as indicated in above note and in instructions to patient .    Patient Instructions  Will notify you  of labs when available.   Sent in BP medication.   may try a different statin medication such as  crestor  Or atorvastatin   depending on labs results .    Concern that feet pain could be from diabetes.   Will be contaced about  ABI testing   Artery flow testing of legs.   Keep appt with dr Darnell Level      Plan  CPX    In 3 mos.  After seeing  Dr Darnell Level.      Standley Brooking. Panosh M.D.

## 2017-12-08 ENCOUNTER — Encounter: Payer: Self-pay | Admitting: Internal Medicine

## 2017-12-08 ENCOUNTER — Ambulatory Visit: Payer: BLUE CROSS/BLUE SHIELD | Admitting: Internal Medicine

## 2017-12-08 VITALS — BP 130/86 | HR 108 | Temp 97.7°F | Wt 174.5 lb

## 2017-12-08 DIAGNOSIS — Z23 Encounter for immunization: Secondary | ICD-10-CM | POA: Diagnosis not present

## 2017-12-08 DIAGNOSIS — Z9119 Patient's noncompliance with other medical treatment and regimen: Secondary | ICD-10-CM

## 2017-12-08 DIAGNOSIS — E785 Hyperlipidemia, unspecified: Secondary | ICD-10-CM | POA: Diagnosis not present

## 2017-12-08 DIAGNOSIS — IMO0002 Reserved for concepts with insufficient information to code with codable children: Secondary | ICD-10-CM

## 2017-12-08 DIAGNOSIS — E118 Type 2 diabetes mellitus with unspecified complications: Secondary | ICD-10-CM

## 2017-12-08 DIAGNOSIS — I10 Essential (primary) hypertension: Secondary | ICD-10-CM

## 2017-12-08 DIAGNOSIS — Z79899 Other long term (current) drug therapy: Secondary | ICD-10-CM

## 2017-12-08 DIAGNOSIS — E1165 Type 2 diabetes mellitus with hyperglycemia: Secondary | ICD-10-CM

## 2017-12-08 DIAGNOSIS — M79671 Pain in right foot: Secondary | ICD-10-CM

## 2017-12-08 DIAGNOSIS — M79672 Pain in left foot: Secondary | ICD-10-CM

## 2017-12-08 DIAGNOSIS — Z91199 Patient's noncompliance with other medical treatment and regimen due to unspecified reason: Secondary | ICD-10-CM

## 2017-12-08 LAB — CBC WITH DIFFERENTIAL/PLATELET
Basophils Absolute: 0.1 10*3/uL (ref 0.0–0.1)
Basophils Relative: 1.1 % (ref 0.0–3.0)
EOS ABS: 0.1 10*3/uL (ref 0.0–0.7)
Eosinophils Relative: 1.7 % (ref 0.0–5.0)
HCT: 46.2 % — ABNORMAL HIGH (ref 36.0–46.0)
Hemoglobin: 16.1 g/dL — ABNORMAL HIGH (ref 12.0–15.0)
LYMPHS ABS: 2.4 10*3/uL (ref 0.7–4.0)
Lymphocytes Relative: 33.2 % (ref 12.0–46.0)
MCHC: 34.9 g/dL (ref 30.0–36.0)
MCV: 88.9 fl (ref 78.0–100.0)
MONOS PCT: 6.6 % (ref 3.0–12.0)
Monocytes Absolute: 0.5 10*3/uL (ref 0.1–1.0)
NEUTROS ABS: 4.2 10*3/uL (ref 1.4–7.7)
NEUTROS PCT: 57.4 % (ref 43.0–77.0)
PLATELETS: 377 10*3/uL (ref 150.0–400.0)
RBC: 5.19 Mil/uL — AB (ref 3.87–5.11)
RDW: 12.8 % (ref 11.5–15.5)
WBC: 7.4 10*3/uL (ref 4.0–10.5)

## 2017-12-08 LAB — BASIC METABOLIC PANEL
BUN: 14 mg/dL (ref 6–23)
CALCIUM: 9.7 mg/dL (ref 8.4–10.5)
CHLORIDE: 98 meq/L (ref 96–112)
CO2: 25 mEq/L (ref 19–32)
CREATININE: 0.7 mg/dL (ref 0.40–1.20)
GFR: 89.1 mL/min (ref >60.00)
GLUCOSE: 311 mg/dL — AB (ref 70–99)
Potassium: 4.9 mEq/L (ref 3.5–5.1)
SODIUM: 134 meq/L — AB (ref 135–145)

## 2017-12-08 LAB — HEPATIC FUNCTION PANEL
ALBUMIN: 4.2 g/dL (ref 3.5–5.2)
ALK PHOS: 82 U/L (ref 39–117)
ALT: 13 U/L (ref 0–35)
AST: 15 U/L (ref 0–37)
BILIRUBIN DIRECT: 0.1 mg/dL (ref 0.0–0.3)
TOTAL PROTEIN: 6.7 g/dL (ref 6.0–8.3)
Total Bilirubin: 0.8 mg/dL (ref 0.2–1.2)

## 2017-12-08 LAB — LIPID PANEL
CHOLESTEROL: 276 mg/dL — AB (ref 0–200)
HDL: 65.8 mg/dL (ref >39.00)
NonHDL: 210.56
TRIGLYCERIDES: 335 mg/dL — AB (ref 0.0–149.0)
Total CHOL/HDL Ratio: 4
VLDL: 67 mg/dL — AB (ref 0.0–40.0)

## 2017-12-08 LAB — MICROALBUMIN / CREATININE URINE RATIO
Creatinine,U: 162.6 mg/dL
MICROALB/CREAT RATIO: 3.1 mg/g (ref 0.0–30.0)
Microalb, Ur: 5 mg/dL — ABNORMAL HIGH (ref 0.0–1.9)

## 2017-12-08 LAB — TSH: TSH: 1.72 u[IU]/mL (ref 0.35–4.50)

## 2017-12-08 LAB — LDL CHOLESTEROL, DIRECT: LDL DIRECT: 167 mg/dL

## 2017-12-08 MED ORDER — LOSARTAN POTASSIUM 50 MG PO TABS: 50.0000 mg | ORAL_TABLET | Freq: Every day | ORAL | 5 refills | Status: DC

## 2017-12-08 NOTE — Patient Instructions (Addendum)
Will notify you  of labs when available.   Sent in BP medication.   may try a different statin medication such as  crestor  Or atorvastatin   depending on labs results .    Concern that feet pain could be from diabetes.   Will be contaced about  ABI testing   Artery flow testing of legs.   Keep appt with dr Darnell Level      Plan  CPX    In 3 mos.  After seeing  Dr Darnell Level.

## 2017-12-11 ENCOUNTER — Telehealth: Payer: Self-pay | Admitting: Internal Medicine

## 2017-12-11 DIAGNOSIS — E785 Hyperlipidemia, unspecified: Secondary | ICD-10-CM

## 2017-12-11 NOTE — Telephone Encounter (Signed)
Attempted to call patient but voicemail is not set up yet. Will try again CRM

## 2017-12-11 NOTE — Telephone Encounter (Signed)
Attempted to call patient to have her call her pharmacy to find the alternatives for losartan.  Unable to leave a message because the voicemail has not been set up yet.  1.  Rx for alternative for losartan? 2. Rx for statin?

## 2017-12-11 NOTE — Telephone Encounter (Signed)
Advised patient to call pharmacy. Pt states statin was not called in.

## 2017-12-11 NOTE — Telephone Encounter (Signed)
Copied from [place] [id number]. Topic: Quick Communication - Rx Refill/Question >> Dec 11, 2017 10:13 AM Burchel, Abbi R wrote: Medication: losartan (COZAAR) 50 MG tablet  Preferred Pharmacy:    Pt states that she was informed by there pharmacy that this rx is unavailable, she is requesing that Dr Regis Bill call in a substitute.  Pt also states she thought she was supposed to be on a statin, but the pharmacy did have a rx for her for one.  Can Dr Regis Bill please clarify and send in Rx if necessary.

## 2017-12-11 NOTE — Telephone Encounter (Signed)
please send in her atorvastatin 20 mg per day  And    Send in valsartan  160 mg per day  Disp 30 refill x 3 as a trial

## 2017-12-14 ENCOUNTER — Ambulatory Visit (HOSPITAL_COMMUNITY)
Admission: RE | Admit: 2017-12-14 | Discharge: 2017-12-14 | Disposition: A | Discharge status: Home / Self Care | Payer: BLUE CROSS/BLUE SHIELD | Source: Ambulatory Visit | Attending: Internal Medicine | Admitting: Internal Medicine

## 2017-12-14 DIAGNOSIS — E118 Type 2 diabetes mellitus with unspecified complications: Secondary | ICD-10-CM

## 2017-12-14 DIAGNOSIS — M79672 Pain in left foot: Secondary | ICD-10-CM | POA: Diagnosis not present

## 2017-12-14 DIAGNOSIS — IMO0002 Reserved for concepts with insufficient information to code with codable children: Secondary | ICD-10-CM

## 2017-12-14 DIAGNOSIS — E1165 Type 2 diabetes mellitus with hyperglycemia: Secondary | ICD-10-CM | POA: Diagnosis not present

## 2017-12-14 DIAGNOSIS — I10 Essential (primary) hypertension: Secondary | ICD-10-CM | POA: Diagnosis not present

## 2017-12-14 DIAGNOSIS — M79671 Pain in right foot: Secondary | ICD-10-CM

## 2017-12-14 NOTE — Telephone Encounter (Signed)
Attempted to call patient but unable to leave a message. 

## 2017-12-15 MED ORDER — VALSARTAN 160 MG PO TABS: 160.0000 mg | ORAL_TABLET | Freq: Every day | ORAL | 3 refills | Status: DC

## 2017-12-15 MED ORDER — ATORVASTATIN CALCIUM 20 MG PO TABS: 20.0000 mg | ORAL_TABLET | Freq: Every day | ORAL | 5 refills | Status: DC

## 2017-12-15 NOTE — Telephone Encounter (Signed)
Patient is aware and Rx sent 

## 2018-01-25 DIAGNOSIS — E113293 Type 2 diabetes mellitus with mild nonproliferative diabetic retinopathy without macular edema, bilateral: Secondary | ICD-10-CM | POA: Diagnosis not present

## 2018-02-15 ENCOUNTER — Ambulatory Visit: Payer: Self-pay | Admitting: Internal Medicine

## 2018-04-12 ENCOUNTER — Other Ambulatory Visit: Payer: Self-pay

## 2018-04-12 DIAGNOSIS — H2513 Age-related nuclear cataract, bilateral: Secondary | ICD-10-CM | POA: Diagnosis not present

## 2018-04-12 DIAGNOSIS — H40053 Ocular hypertension, bilateral: Secondary | ICD-10-CM | POA: Diagnosis not present

## 2018-04-12 DIAGNOSIS — E113293 Type 2 diabetes mellitus with mild nonproliferative diabetic retinopathy without macular edema, bilateral: Secondary | ICD-10-CM | POA: Diagnosis not present

## 2018-11-29 NOTE — Progress Notes (Signed)
Chief Complaint  Patient presents with  . Follow-up    last visit a year ago out of dm meds   . Medication Management  . Hypertension  . Hyperlipidemia  . Diabetes    HPI: [name] [name] 68 y.o. come in [place] seen a year ago    Has DM out of control and was supposed to be seeing and endoprcrinoligist   Last visit dr Darnell Level was    June 2019   ! Out of control dm and lipids etc  On meds but ran out of metformin for a month and actose recently and off atorva for a few months    couple of other issues   Shoulders pain  Right and last month   Left back   And buttock pain and   Back pain with standing.  No injury dec rom right shoulder    Not a lot  Of exercise . Walks a good bit.  Helps some.      Running out of med  fpr BP  Taking med  No readings .     Blood sugar  from  150 - 400 at times.  No lows  Taking truclicity not on insulin decided not to  Take last year   Will be going on medicare and  Advice is that trulciity is not on formulary   Asks opinion    getes some toe pain at night and left mtp joint area   ROS: See pertinent positives and negatives per HPI. hh of 3  .  Working out side home   And next month part time.   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   gs 17 yo  And son works with her.    Past Medical History:  Diagnosis Date  . Allergic rhinitis   . Diabetes mellitus   . Hyperlipidemia   . Hypertension   . IBS (irritable bowel syndrome)   . Normal nuclear stress test 1 /08   low risk study   . Sciatica     Family History  Problem Relation Age of Onset  . Acute myelogenous leukemia Mother        died when pat age 18   . Diabetes Father   . Diabetes Paternal Grandfather   . Breast cancer Neg Hx     Social History   Socioeconomic History  . Marital status: Legally Separated    Spouse name: Not on file  . Number of children: Not on file  . Years of education: Not on file  . Highest education level: Not on file   Occupational History  . Not on file  Social Needs  . Financial resource strain: Not on file  . Food insecurity    Worry: Not on file    Inability: Not on file  . Transportation needs    Medical: Not on file    Non-medical: Not on file  Tobacco Use  . Smoking status: Never Smoker  . Smokeless tobacco: Never Used  Substance and Sexual Activity  . Alcohol use: No  . Drug use: No  . Sexual activity: Not on file  Lifestyle  . Physical activity    Days per week: Not on file    Minutes per session: Not on file  . Stress: Not on file  Relationships  . Social Herbalist on phone: Not on file    Gets together: Not on file  Attends religious service: Not on file    Active member of club or organization: Not on file    Attends meetings of clubs or organizations: Not on file    Relationship status: Not on file  Other Topics Concern  . 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           Outpatient Medications Prior to Visit  Medication Sig Dispense Refill  . atorvastatin (LIPITOR) 20 MG tablet Take 1 tablet (20 mg total) by mouth daily. 30 tablet 5  . FREESTYLE UNISTICK II LANCETS MISC Use to check blood sugar 1-2 times daily 100 each 3  . glucose blood (FREESTYLE LITE) test strip Use as instructed 100 each 0  . Glucose Blood (FREESTYLE TEST VI) by In Vitro route.      Marland Kitchen glucose blood test strip Use to check blood sugar 1-2 times daily 100 each 3  . Insulin Pen Needle (CAREFINE PEN NEEDLES) 32G X 4 MM MISC Use to inject insulin daily 100 each 5  . valsartan (DIOVAN) 160 MG tablet Take 1 tablet (160 mg total) by mouth daily. 30 tablet 3  . Dulaglutide (TRULICITY) 1.5 0000000 SOPN Inject 1.5 mg into the skin once a week. 4  pen 11  . glimepiride (AMARYL) 4 MG tablet Take 1 tablet (4 mg total) by mouth 2 (two) times daily before a meal. 180 tablet 3  . losartan (COZAAR) 50 MG tablet Take 1 tablet (50 mg total) by mouth daily. 30 tablet 5  . metFORMIN (GLUCOPHAGE) 1000 MG tablet TAKE 1 TABLET (1000 MG TOTAL) BY MOUTH 2 (TWO) TIMES DAILY WITH A MEAL. 180 tablet 3  . pioglitazone (ACTOS) 15 MG tablet TAKE 1 TABLET (15 MG TOTAL) BY MOUTH DAILY. 90 tablet 3   No facility-administered medications prior to visit.      EXAM:  BP 120/72 (BP Location: Right Arm, Patient Position: Sitting, Cuff Size: Normal)   Pulse (!) 105   Temp 98 F (36.7 C) (Temporal)   Wt 169 lb (76.7 kg)   SpO2 98%   BMI 29.01 kg/m   Body mass index is 29.01 kg/m.  GENERAL: vitals reviewed and listed above, alert, oriented, appears well hydrated and in no acute distress HEENT: atraumatic, conjunctiva  clear, no obvious abnormalities on inspection of external nose and ears OP masked  NECK: no obvious masses on inspection palpation  LUNGS: clear to auscultation bilaterally, no wheezes, rales or rhonchi, good air movement CV: HRRR, no clubbing cyanosis or  peripheral edema nl cap refill  Hr 110  No g or m  Abdomen:  Sof,t normal bowel sounds without hepatosplenomegaly, no guarding rebound or masses no CVA tenderness MS: moves all extremities without noticeable focal  Abnormality  Dec rom right shoulder and tender at rcm  PSYCH: pleasant and cooperative, no obvious depression or anxiety Diabetic Foot Exam - Simple   Simple Foot Form Diabetic Foot exam was performed with the following findings: Yes 11/30/2018 11:25 AM  Visual Inspection No deformities, no ulcerations, no other skin breakdown bilaterally: Yes Sensation Testing Intact to touch and monofilament testing bilaterally: Yes Pulse Check  Posterior Tibialis and Dorsalis pulse intact bilaterally: Yes Comments     Lab Results  Component Value Date   WBC 7.4 12/08/2017   HGB  16.1 (H) 12/08/2017   HCT 46.2 (H) 12/08/2017   PLT 377.0 12/08/2017   GLUCOSE 311 (H) 12/08/2017   CHOL 276 (H) 12/08/2017   TRIG 335.0 (H) 12/08/2017   HDL 65.80 12/08/2017   LDLDIRECT 167.0 12/08/2017   LDLCALC 73 10/09/2011   ALT 13 12/08/2017   AST 15 12/08/2017   NA 134 (L) 12/08/2017   K 4.9 12/08/2017   CL 98 12/08/2017   CREATININE 0.70 12/08/2017   BUN 14 12/08/2017   CO2 25 12/08/2017   TSH 1.72 12/08/2017   HGBA1C 9.8 (A) 07/23/2017   MICROALBUR 5.0 (H) 12/08/2017   BP Readings from Last 3 Encounters:  11/30/18 120/72  12/08/17 130/86  07/23/17 (!) 158/80   Wt Readings from Last 3 Encounters:  11/30/18 169 lb (76.7 kg)  12/08/17 174 lb 8 oz (79.2 kg)  07/23/17 178 lb 9.6 oz (81 kg)    ASSESSMENT AND PLAN:  Discussed the following assessment and plan:  Uncontrolled type 2 diabetes mellitus with complication, without long-term current use of insulin ([facility]) - Plan: Basic metabolic panel, CBC with Differential/Platelet, Hemoglobin A1c, Hepatic function panel, Lipid panel, TSH, Microalbumin / creatinine urine ratio, T4, free  Essential hypertension - Plan: Basic metabolic panel, CBC with Differential/Platelet, Hemoglobin A1c, Hepatic function panel, Lipid panel, TSH, Microalbumin / creatinine urine ratio, T4, free  Hyperlipidemia, unspecified hyperlipidemia type - Plan: Basic metabolic panel, CBC with Differential/Platelet, Hemoglobin A1c, Hepatic function panel, Lipid panel, TSH, Microalbumin / creatinine urine ratio, T4, free  Medication management - Plan: Basic metabolic panel, CBC with Differential/Platelet, Hemoglobin A1c, Hepatic function panel, Lipid panel, TSH, Microalbumin / creatinine urine ratio, T4, free  Pain in both feet - Plan: Basic metabolic panel, CBC with Differential/Platelet, Hemoglobin A1c, Hepatic function panel, Lipid panel, TSH, Microalbumin / creatinine urine ratio, T4, free  Need for immunization against influenza - Plan: Flu Vaccine QUAD  High Dose(Fluad)  THYROID FUNCTION TEST, ABNORMAL - Plan: Basic metabolic panel, CBC with Differential/Platelet, Hemoglobin A1c, Hepatic function panel, Lipid panel, TSH, Microalbumin / creatinine urine ratio, T4, free  Chronic right shoulder pain  Chronic midline low back pain without sciatica Return for fasting lab  no later than a month  and rov in 2-3 mos CPX . Disc about fu  That she hasnt been seen in over a year  For many years She can wait only a month to get labs and get back on meds   And then cpx    Failure to fu and may wish a different endocrine assessment  I refill  90 days of medication and needs lab and fu as planned   Pt agrees     -Patient advised to return or notify health care team  if  new concerns arise.  Patient Instructions  Will refill meformin actos  trylicity  Losartan and atorva .   Need fasting lab work as it has been over a year since checked . Can get  In [place] or a month.   Shoulder could be bursitis or  rcm shoulder impingement   Try topical VOLTAREN GEL otc  4 x per day  See ortho /SM about the shoulder and back .  Other options.   Walking is ok andgood as opposed to sitting standing.   trulicity   = other meds  In class victoza , byduron  Ozempic or oral Rybelsyus  Preferred     Is in the same group   Your pulse  is up today but heart sounds ok  Make sure hyrdtated.  Plan cpx  In 3-4 months ( on medicare  Ok) or as needed     Adhesive Capsulitis  Adhesive capsulitis, also called frozen shoulder, causes the shoulder to become stiff and painful to move. This condition happens when there is inflammation of the tendons and ligaments that surround the shoulder joint (shoulder capsule). What are the causes? This condition may be caused by:  An injury to your shoulder joint.  Straining your shoulder.  Not moving your shoulder for a period of time. This can happen if your arm was injured or in a sling.  Long-standing conditions, such as: ?  Diabetes. ? Thyroid problems. ? Heart disease. ? Stroke. ? Rheumatoid arthritis. ? Lung disease. In some cases, the cause is not known. What increases the risk? You are more likely to develop this condition if you are:  A woman.  Older than 66 years of age. What are the signs or symptoms? Symptoms of this condition include:  Pain in your shoulder when you move your arm. There may also be pain when parts of your shoulder are touched. The pain may be worse at night or when you are resting.  A sore or aching shoulder.  The inability to move your shoulder normally.  Muscle spasms. How is this diagnosed? This condition is diagnosed with a physical exam and imaging tests, such as an X-ray or MRI. How is this treated? This condition may be treated with:  Treatment of the underlying cause or condition.  Medicine. Medicine may be given to relieve pain, inflammation, or muscle spasms.  Steroid injections into the shoulder joint.  Physical therapy. This involves performing exercises to get the shoulder moving again.  Acupuncture. This is a type of treatment that involves stimulating specific points on your body by inserting thin needles through your skin.  Shoulder manipulation. This is a procedure to move the shoulder into another position. It is done after you are given a medicine to make you fall asleep (general anesthetic). The joint may also be injected with salt water at high pressure to break down scarring.  Surgery. This may be done in severe cases when other treatments have failed. Although most people recover completely from adhesive capsulitis, some may not regain full shoulder movement. Follow these instructions at home: Managing pain, stiffness, and swelling      If directed, put ice on the injured area: ? Put ice in a plastic bag. ? Place a towel between your skin and the bag. ? Leave the ice on for 20 minutes, 2-3 times per day.  If directed, apply heat to the  affected area before you exercise. Use the heat source that your health care provider recommends, such as a moist heat pack or a heating pad. ? Place a towel between your skin and the heat source. ? Leave the heat on for 20-30 minutes. ? Remove the heat if your skin turns bright red. This is especially important if you are unable to feel pain, heat, or cold. You may have a greater risk of getting burned. General instructions  Take over-the-counter and prescription medicines only as told by your health care provider.  If you are being treated with physical therapy, follow instructions from your physical therapist.  Avoid exercises that put a lot of demand on your shoulder, such as  throwing. These exercises can make pain worse.  Keep all follow-up visits as told by your health care provider. This is important. Contact a health care provider if:  You develop new symptoms.  Your symptoms get worse. Summary  Adhesive capsulitis, also called frozen shoulder, causes the shoulder to become stiff and painful to move.  You are more likely to have this condition if you are a woman and over age 6.  It is treated with physical therapy, medicines, and sometimes surgery. This information is not intended to replace advice given to you by your health care provider. Make sure you discuss any questions you have with your health care provider. Document Released: 11/24/2008 Document Revised: 07/03/2017 Document Reviewed: 07/03/2017 Elsevier Patient Education  2020 [place] Eduardo Honor M.D.

## 2018-11-30 ENCOUNTER — Encounter: Payer: Self-pay | Admitting: Internal Medicine

## 2018-11-30 ENCOUNTER — Ambulatory Visit (INDEPENDENT_AMBULATORY_CARE_PROVIDER_SITE_OTHER): Payer: 59 | Admitting: Internal Medicine

## 2018-11-30 ENCOUNTER — Other Ambulatory Visit: Payer: Self-pay

## 2018-11-30 VITALS — BP 120/72 | HR 105 | Temp 98.0°F | Wt 169.0 lb

## 2018-11-30 DIAGNOSIS — IMO0002 Reserved for concepts with insufficient information to code with codable children: Secondary | ICD-10-CM

## 2018-11-30 DIAGNOSIS — M79672 Pain in left foot: Secondary | ICD-10-CM

## 2018-11-30 DIAGNOSIS — G8929 Other chronic pain: Secondary | ICD-10-CM

## 2018-11-30 DIAGNOSIS — E118 Type 2 diabetes mellitus with unspecified complications: Secondary | ICD-10-CM

## 2018-11-30 DIAGNOSIS — E785 Hyperlipidemia, unspecified: Secondary | ICD-10-CM

## 2018-11-30 DIAGNOSIS — M545 Low back pain, unspecified: Secondary | ICD-10-CM

## 2018-11-30 DIAGNOSIS — Z23 Encounter for immunization: Secondary | ICD-10-CM | POA: Diagnosis not present

## 2018-11-30 DIAGNOSIS — I10 Essential (primary) hypertension: Secondary | ICD-10-CM | POA: Diagnosis not present

## 2018-11-30 DIAGNOSIS — M25511 Pain in right shoulder: Secondary | ICD-10-CM

## 2018-11-30 DIAGNOSIS — Z79899 Other long term (current) drug therapy: Secondary | ICD-10-CM | POA: Diagnosis not present

## 2018-11-30 DIAGNOSIS — E1165 Type 2 diabetes mellitus with hyperglycemia: Secondary | ICD-10-CM

## 2018-11-30 DIAGNOSIS — R946 Abnormal results of thyroid function studies: Secondary | ICD-10-CM

## 2018-11-30 DIAGNOSIS — M79671 Pain in right foot: Secondary | ICD-10-CM

## 2018-11-30 MED ORDER — GLIMEPIRIDE 4 MG PO TABS: 4.0000 mg | ORAL_TABLET | Freq: Two times a day (BID) | ORAL | 0 refills | Status: DC

## 2018-11-30 MED ORDER — PIOGLITAZONE HCL 15 MG PO TABS: ORAL_TABLET | ORAL | 0 refills | Status: DC

## 2018-11-30 MED ORDER — LOSARTAN POTASSIUM 50 MG PO TABS: 50.0000 mg | ORAL_TABLET | Freq: Every day | ORAL | 0 refills | Status: DC

## 2018-11-30 MED ORDER — TRULICITY 1.5 MG/0.5ML ~~LOC~~ SOAJ: 1.5000 mg | SUBCUTANEOUS | 11 refills | Status: DC

## 2018-11-30 MED ORDER — METFORMIN HCL 1000 MG PO TABS: ORAL_TABLET | ORAL | 1 refills | Status: DC

## 2018-11-30 NOTE — Patient Instructions (Addendum)
Will refill meformin actos  trylicity  Losartan and atorva .   Need fasting lab work as it has been over a year since checked . Can get  In [place] or a month.   Shoulder could be bursitis or  rcm shoulder impingement   Try topical VOLTAREN GEL otc  4 x per day  See ortho /SM about the shoulder and back .  Other options.   Walking is ok andgood as opposed to sitting standing.   trulicity   = other meds  In class victoza , byduron Ozempic or oral Rybelsyus  Preferred     Is in the same group   Your pulse  is up today but heart sounds ok  Make sure hyrdtated.  Plan cpx  In 3-4 months ( on medicare  Ok) or as needed     Adhesive Capsulitis  Adhesive capsulitis, also called frozen shoulder, causes the shoulder to become stiff and painful to move. This condition happens when there is inflammation of the tendons and ligaments that surround the shoulder joint (shoulder capsule). What are the causes? This condition may be caused by:  An injury to your shoulder joint.  Straining your shoulder.  Not moving your shoulder for a period of time. This can happen if your arm was injured or in a sling.  Long-standing conditions, such as: ? Diabetes. ? Thyroid problems. ? Heart disease. ? Stroke. ? Rheumatoid arthritis. ? Lung disease. In some cases, the cause is not known. What increases the risk? You are more likely to develop this condition if you are:  A woman.  Older than 66 years of age. What are the signs or symptoms? Symptoms of this condition include:  Pain in your shoulder when you move your arm. There may also be pain when parts of your shoulder are touched. The pain may be worse at night or when you are resting.  A sore or aching shoulder.  The inability to move your shoulder normally.  Muscle spasms. How is this diagnosed? This condition is diagnosed with a physical exam and imaging tests, such as an X-ray or MRI. How is this treated? This condition may be treated  with:  Treatment of the underlying cause or condition.  Medicine. Medicine may be given to relieve pain, inflammation, or muscle spasms.  Steroid injections into the shoulder joint.  Physical therapy. This involves performing exercises to get the shoulder moving again.  Acupuncture. This is a type of treatment that involves stimulating specific points on your body by inserting thin needles through your skin.  Shoulder manipulation. This is a procedure to move the shoulder into another position. It is done after you are given a medicine to make you fall asleep (general anesthetic). The joint may also be injected with salt water at high pressure to break down scarring.  Surgery. This may be done in severe cases when other treatments have failed. Although most people recover completely from adhesive capsulitis, some may not regain full shoulder movement. Follow these instructions at home: Managing pain, stiffness, and swelling      If directed, put ice on the injured area: ? Put ice in a plastic bag. ? Place a towel between your skin and the bag. ? Leave the ice on for 20 minutes, 2-3 times per day.  If directed, apply heat to the affected area before you exercise. Use the heat source that your health care provider recommends, such as a moist heat pack or a heating pad. ? Place  a towel between your skin and the heat source. ? Leave the heat on for 20-30 minutes. ? Remove the heat if your skin turns bright red. This is especially important if you are unable to feel pain, heat, or cold. You may have a greater risk of getting burned. General instructions  Take over-the-counter and prescription medicines only as told by your health care provider.  If you are being treated with physical therapy, follow instructions from your physical therapist.  Avoid exercises that put a lot of demand on your shoulder, such as throwing. These exercises can make pain worse.  Keep all follow-up visits  as told by your health care provider. This is important. Contact a health care provider if:  You develop new symptoms.  Your symptoms get worse. Summary  Adhesive capsulitis, also called frozen shoulder, causes the shoulder to become stiff and painful to move.  You are more likely to have this condition if you are a woman and over age 52.  It is treated with physical therapy, medicines, and sometimes surgery. This information is not intended to replace advice given to you by your health care provider. Make sure you discuss any questions you have with your health care provider. Document Released: 11/24/2008 Document Revised: 07/03/2017 Document Reviewed: 07/03/2017 Elsevier Patient Education  2020 [employer].

## 2018-12-31 ENCOUNTER — Other Ambulatory Visit: Payer: 59

## 2019-01-04 ENCOUNTER — Other Ambulatory Visit: Payer: Self-pay

## 2019-01-04 ENCOUNTER — Other Ambulatory Visit (INDEPENDENT_AMBULATORY_CARE_PROVIDER_SITE_OTHER): Payer: 59

## 2019-01-04 DIAGNOSIS — I10 Essential (primary) hypertension: Secondary | ICD-10-CM | POA: Diagnosis not present

## 2019-01-04 DIAGNOSIS — E118 Type 2 diabetes mellitus with unspecified complications: Secondary | ICD-10-CM

## 2019-01-04 DIAGNOSIS — R946 Abnormal results of thyroid function studies: Secondary | ICD-10-CM

## 2019-01-04 DIAGNOSIS — M79672 Pain in left foot: Secondary | ICD-10-CM

## 2019-01-04 DIAGNOSIS — E785 Hyperlipidemia, unspecified: Secondary | ICD-10-CM

## 2019-01-04 DIAGNOSIS — Z79899 Other long term (current) drug therapy: Secondary | ICD-10-CM | POA: Diagnosis not present

## 2019-01-04 DIAGNOSIS — IMO0002 Reserved for concepts with insufficient information to code with codable children: Secondary | ICD-10-CM

## 2019-01-04 DIAGNOSIS — E1165 Type 2 diabetes mellitus with hyperglycemia: Secondary | ICD-10-CM

## 2019-01-04 DIAGNOSIS — M79671 Pain in right foot: Secondary | ICD-10-CM

## 2019-01-04 LAB — CBC WITH DIFFERENTIAL/PLATELET
Basophils Absolute: 0.1 10*3/uL (ref 0.0–0.1)
Basophils Relative: 1.1 % (ref 0.0–3.0)
Eosinophils Absolute: 0.1 10*3/uL (ref 0.0–0.7)
Eosinophils Relative: 1.6 % (ref 0.0–5.0)
HCT: 45.8 % (ref 36.0–46.0)
Hemoglobin: 16 g/dL — ABNORMAL HIGH (ref 12.0–15.0)
Lymphocytes Relative: 30.6 % (ref 12.0–46.0)
Lymphs Abs: 2.3 10*3/uL (ref 0.7–4.0)
MCHC: 34.9 g/dL (ref 30.0–36.0)
MCV: 88.7 fl (ref 78.0–100.0)
Monocytes Absolute: 0.5 10*3/uL (ref 0.1–1.0)
Monocytes Relative: 6.4 % (ref 3.0–12.0)
Neutro Abs: 4.5 10*3/uL (ref 1.4–7.7)
Neutrophils Relative %: 60.3 % (ref 43.0–77.0)
Platelets: 359 10*3/uL (ref 150.0–400.0)
RBC: 5.16 Mil/uL — ABNORMAL HIGH (ref 3.87–5.11)
RDW: 12.8 % (ref 11.5–15.5)
WBC: 7.5 10*3/uL (ref 4.0–10.5)

## 2019-01-04 LAB — BASIC METABOLIC PANEL
BUN: 11 mg/dL (ref 6–23)
CO2: 26 mEq/L (ref 19–32)
Calcium: 9.2 mg/dL (ref 8.4–10.5)
Chloride: 97 mEq/L (ref 96–112)
Creatinine, Ser: 0.54 mg/dL (ref 0.40–1.20)
GFR: 112.73 mL/min (ref >60.00)
Glucose, Bld: 297 mg/dL — ABNORMAL HIGH (ref 70–99)
Potassium: 4.4 mEq/L (ref 3.5–5.1)
Sodium: 133 mEq/L — ABNORMAL LOW (ref 135–145)

## 2019-01-04 LAB — LDL CHOLESTEROL, DIRECT: Direct LDL: 149 mg/dL

## 2019-01-04 LAB — LIPID PANEL
Cholesterol: 264 mg/dL — ABNORMAL HIGH (ref 0–200)
HDL: 65.8 mg/dL (ref >39.00)
NonHDL: 198.14
Total CHOL/HDL Ratio: 4
Triglycerides: 339 mg/dL — ABNORMAL HIGH (ref 0.0–149.0)
VLDL: 67.8 mg/dL — ABNORMAL HIGH (ref 0.0–40.0)

## 2019-01-04 LAB — HEPATIC FUNCTION PANEL
ALT: 11 U/L (ref 0–35)
AST: 11 U/L (ref 0–37)
Albumin: 3.7 g/dL (ref 3.5–5.2)
Alkaline Phosphatase: 88 U/L (ref 39–117)
Bilirubin, Direct: 0.1 mg/dL (ref 0.0–0.3)
Total Bilirubin: 0.8 mg/dL (ref 0.2–1.2)
Total Protein: 6.4 g/dL (ref 6.0–8.3)

## 2019-01-04 LAB — HEMOGLOBIN A1C: Hgb A1c MFr Bld: 10.7 % — ABNORMAL HIGH (ref 4.6–6.5)

## 2019-01-04 LAB — MICROALBUMIN / CREATININE URINE RATIO
Creatinine,U: 55.1 mg/dL
Microalb Creat Ratio: 1.3 mg/g (ref 0.0–30.0)
Microalb, Ur: <0.7 mg/dL (ref 0.0–1.9)

## 2019-01-04 LAB — T4, FREE: Free T4: 1.19 ng/dL (ref 0.60–1.60)

## 2019-01-04 LAB — TSH: TSH: 1.46 u[IU]/mL (ref 0.35–4.50)

## 2019-01-05 ENCOUNTER — Other Ambulatory Visit: Payer: Self-pay

## 2019-01-05 MED ORDER — ATORVASTATIN CALCIUM 40 MG PO TABS: 40.0000 mg | ORAL_TABLET | Freq: Every day | ORAL | 1 refills | Status: DC

## 2019-01-12 ENCOUNTER — Encounter: Payer: Self-pay | Admitting: Internal Medicine

## 2019-01-12 ENCOUNTER — Other Ambulatory Visit: Payer: Self-pay

## 2019-01-12 ENCOUNTER — Telehealth (INDEPENDENT_AMBULATORY_CARE_PROVIDER_SITE_OTHER): Payer: 59 | Admitting: Internal Medicine

## 2019-01-12 DIAGNOSIS — E118 Type 2 diabetes mellitus with unspecified complications: Secondary | ICD-10-CM | POA: Diagnosis not present

## 2019-01-12 DIAGNOSIS — IMO0002 Reserved for concepts with insufficient information to code with codable children: Secondary | ICD-10-CM

## 2019-01-12 DIAGNOSIS — E785 Hyperlipidemia, unspecified: Secondary | ICD-10-CM | POA: Diagnosis not present

## 2019-01-12 DIAGNOSIS — Z79899 Other long term (current) drug therapy: Secondary | ICD-10-CM

## 2019-01-12 DIAGNOSIS — E1165 Type 2 diabetes mellitus with hyperglycemia: Secondary | ICD-10-CM

## 2019-01-12 NOTE — Progress Notes (Signed)
Virtual Visit via Video Note  I connected with@ on 01/12/19 at 10:00 AM EST by a video enabled telemedicine application and verified that I am speaking with the correct person using two identifiers. Location patient: home Location provider:work  office Persons participating in the virtual visit: patient, provider  [place] national recommendations  regarding COVID 19 pandemic   video visit is advised over in office visit for this patient.  Patient aware  of the limitations of evaluation and management by telemedicine and  availability of in person appointments. and agreed to proceed.   HPI: [name] [name] presents for video visit we are following up her laboratory results from last week. Her A1c was quite high lipids were out of range we increased her Lipitor to 40 mg She did get her Trulicity filled has not checked her blood sugar in a week it was about in the 200s.  States she has not been eating as healthy as she should but plans on improving.  Has been to the dietitian before and had some helpful hints but is working on what she can eat instead of what she cannot. She is back on her medications as on list. She has never been on an SGLT2 I believe but has been on Vick toes in the past     ROS: See pertinent positives and negatives per HPI.  Past Medical History:  Diagnosis Date  . Allergic rhinitis   . Diabetes mellitus   . Hyperlipidemia   . Hypertension   . IBS (irritable bowel syndrome)   . Normal nuclear stress test 1 /08   low risk study   . Sciatica     Past Surgical History:  Procedure Laterality Date  . CESAREAN SECTION     x 3; [id number]  . NM MYOVIEW LTD  02/2006   low risk study    Family History  Problem Relation Age of Onset  . Acute myelogenous leukemia Mother        died when pat age 65   . Diabetes Father   . Diabetes Paternal Grandfather   . Breast cancer Neg Hx     Social History   Tobacco Use  . Smoking status: Never Smoker  . Smokeless  tobacco: Never Used  Substance Use Topics  . Alcohol use: No  . Drug use: No      Current Outpatient Medications:  .  atorvastatin (LIPITOR) 20 MG tablet, Take 1 tablet (20 mg total) by mouth daily., Disp: 30 tablet, Rfl: 5 .  atorvastatin (LIPITOR) 40 MG tablet, Take 1 tablet (40 mg total) by mouth daily., Disp: 90 tablet, Rfl: 1 .  Dulaglutide (TRULICITY) 1.5 0000000 SOPN, Inject 1.5 mg into the skin once a week., Disp: 4 pen, Rfl: 11 .  FREESTYLE UNISTICK II LANCETS MISC, Use to check blood sugar 1-2 times daily, Disp: 100 each, Rfl: 3 .  glimepiride (AMARYL) 4 MG tablet, Take 1 tablet (4 mg total) by mouth 2 (two) times daily before a meal., Disp: 180 tablet, Rfl: 0 .  glucose blood (FREESTYLE LITE) test strip, Use as instructed, Disp: 100 each, Rfl: 0 .  Glucose Blood (FREESTYLE TEST VI), by In Vitro route.  , Disp: , Rfl:  .  glucose blood test strip, Use to check blood sugar 1-2 times daily, Disp: 100 each, Rfl: 3 .  Insulin Pen Needle (CAREFINE PEN NEEDLES) 32G X 4 MM MISC, Use to inject insulin daily, Disp: 100 each, Rfl: 5 .  losartan (COZAAR) 50 MG tablet,  Take 1 tablet (50 mg total) by mouth daily., Disp: 90 tablet, Rfl: 0 .  metFORMIN (GLUCOPHAGE) 1000 MG tablet, TAKE 1 TABLET (1000 MG TOTAL) BY MOUTH 2 (TWO) TIMES DAILY WITH A MEAL., Disp: 180 tablet, Rfl: 1 .  pioglitazone (ACTOS) 15 MG tablet, TAKE 1 TABLET (15 MG TOTAL) BY MOUTH DAILY., Disp: 90 tablet, Rfl: 0 .  valsartan (DIOVAN) 160 MG tablet, Take 1 tablet (160 mg total) by mouth daily., Disp: 30 tablet, Rfl: 3  EXAM: BP Readings from Last 3 Encounters:  11/30/18 120/72  12/08/17 130/86  07/23/17 (!) 158/80    VITALS per patient if applicable:  GENERAL: alert, oriented, appears well and in no acute distress  HEENT: atraumatic, conjunttiva clear, no obvious abnormalities on inspection of external nose and ears  NECK: normal movements of the head and neck  LUNGS: on inspection no signs of respiratory  distress, breathing rate appears normal, no obvious gross SOB, gasping or wheezing  CV: no obvious cyanosis  MS: moves all visible extremities without noticeable abnormality  PSYCH/NEURO: pleasant and cooperative, no obvious depression or anxiety, speech and thought processing grossly intact Lab Results  Component Value Date   WBC 7.5 01/04/2019   HGB 16.0 (H) 01/04/2019   HCT 45.8 01/04/2019   PLT 359.0 01/04/2019   GLUCOSE 297 (H) 01/04/2019   CHOL 264 (H) 01/04/2019   TRIG 339.0 (H) 01/04/2019   HDL 65.80 01/04/2019   LDLDIRECT 149.0 01/04/2019   LDLCALC 73 10/09/2011   ALT 11 01/04/2019   AST 11 01/04/2019   NA 133 (L) 01/04/2019   K 4.4 01/04/2019   CL 97 01/04/2019   CREATININE 0.54 01/04/2019   BUN 11 01/04/2019   CO2 26 01/04/2019   TSH 1.46 01/04/2019   HGBA1C 10.7 (H) 01/04/2019   MICROALBUR <0.7 01/04/2019    ASSESSMENT AND PLAN:  Discussed the following assessment and plan:    ICD-10-CM   1. Uncontrolled type 2 diabetes mellitus with complication, without long-term current use of insulin (HCC)  E11.8    E11.65    See text  2. Hyperlipidemia, unspecified hyperlipidemia type  E78.5    Increase statin 40 mg atorvastatin per day  3. Medication management  [phone]    Patient states that she had not been on her medicines for over a month except for the glimepiride and was eating candy at [place].  She states that she is motivated now to get things under better control since she is reduced her work hours.  She is back on the Trulicity just enrolled in Medicare but does not have a card yet.  Believe [employer] she will check with her insurance about formulary Ozempic and [place] and [place].  At this time she prefers not to be on insulin. We discussed food choices on the run and monitoring she will send in blood sugar readings twice a day for at least 2 weeks keep her appointment in January and will adjust things before then if sugars are 300-400 and above she needs  to contact us then for advice. 1 option would be to increase her Trulicity the 3 mg new FDA indication.  Versus switch to Ozempic. Would still have her get back with endocrinology at some point. Fortunately currently she feels fine without any significant symptoms.  Does have cataracts surgery was delayed because of Covid. Hopefully being on a more consistent insurance will help formulary and medication coverage.  Counseled.   Expectant management and discussion of plan and treatment with opportunity  to ask questions and all were answered. The patient agreed with the plan and demonstrated an understanding of the instructions.   Advised to call back or seek an in-person evaluation if worsening  or having  further concerns . In interim  Return for Twice daily blood sugar readings of 2 weeks or so sign up for my chart keep January appointment.   [name], MD

## 2019-02-24 ENCOUNTER — Other Ambulatory Visit: Payer: Self-pay | Admitting: Internal Medicine

## 2019-03-08 ENCOUNTER — Encounter: Payer: 59 | Admitting: Internal Medicine

## 2019-03-08 NOTE — Progress Notes (Deleted)
No chief complaint on file.   HPI: Patient  [name] [name]  69 y.o. comes in today for Preventive Health Care visit .    Health Maintenance  Topic Date Due  . Hepatitis C Screening  October 26, 1950  . TETANUS/TDAP  02/11/2015  . COLONOSCOPY  02/11/2016  . DEXA SCAN  05/19/2017  . PNA vac Low Risk Adult (1 of 2 - PCV13) 05/19/2017  . OPHTHALMOLOGY EXAM  10/01/2017  . HEMOGLOBIN A1C  07/04/2019  . MAMMOGRAM  11/10/2019  . FOOT EXAM  11/30/2019  . INFLUENZA VACCINE  Completed   Health Maintenance Review LIFESTYLE:  Exercise:   Tobacco/ETS: Alcohol:  Sugar beverages: Sleep: Drug use: no HH of  Work:    ROS:  GEN/ HEENT: No fever, significant weight changes sweats headaches vision problems hearing changes, CV/ PULM; No chest pain shortness of breath cough, syncope,edema  change in exercise tolerance. GI /GU: No adominal pain, vomiting, change in bowel habits. No blood in the stool. No significant GU symptoms. SKIN/HEME: ,no acute skin rashes suspicious lesions or bleeding. No lymphadenopathy, nodules, masses.  NEURO/ PSYCH:  No neurologic signs such as weakness numbness. No depression anxiety. IMM/ Allergy: No unusual infections.  Allergy .   REST of 12 system review negative except as per HPI   Past Medical History:  Diagnosis Date  . Allergic rhinitis   . Diabetes mellitus   . Hyperlipidemia   . Hypertension   . IBS (irritable bowel syndrome)   . Normal nuclear stress test 1 /08   low risk study   . Sciatica     Past Surgical History:  Procedure Laterality Date  . CESAREAN SECTION     x 3; [id number]  . NM MYOVIEW LTD  02/2006   low risk study    Family History  Problem Relation Age of Onset  . Acute myelogenous leukemia Mother        died when pat age 67   . Diabetes Father   . Diabetes Paternal Grandfather   . Breast cancer Neg Hx     Social History   Socioeconomic History  . Marital status: Legally Separated    Spouse name: Not on file  .  Number of children: Not on file  . Years of education: Not on file  . Highest education level: Not on file  Occupational History  . Not on file  Tobacco Use  . Smoking status: Never Smoker  . Smokeless tobacco: Never Used  Substance and Sexual Activity  . Alcohol use: No  . Drug use: No  . Sexual activity: Not on file  Other Topics Concern  . 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          Social Determinants of Health   Financial Resource Strain:   . Difficulty of Paying Living Expenses: Not on file  Food Insecurity:   . Worried About Charity fundraiser in the Last Year: Not on file  . Ran Out of Food in the Last Year: Not on file  Transportation Needs:   . Lack of Transportation (Medical): Not on file  .  Lack of Transportation (Non-Medical): Not on file  Physical Activity:   . Days of Exercise per Week: Not on file  . Minutes of Exercise per Session: Not on file  Stress:   . Feeling of Stress : Not on file  Social Connections:   . Frequency of Communication with Friends and Family: Not on file  . Frequency of Social Gatherings with Friends and Family: Not on file  . Attends Religious Services: Not on file  . Active Member of Clubs or Organizations: Not on file  . Attends Archivist Meetings: Not on file  . Marital Status: Not on file    Outpatient Medications Prior to Visit  Medication Sig Dispense Refill  . atorvastatin (LIPITOR) 40 MG tablet Take 1 tablet (40 mg total) by mouth daily. 90 tablet 1  . Dulaglutide (TRULICITY) 1.5 0000000 SOPN Inject 1.5 mg into the skin once a week. 4 pen 11  . FREESTYLE UNISTICK II LANCETS MISC Use to check blood sugar 1-2 times daily 100 each 3  . glimepiride (AMARYL) 4  MG tablet TAKE 1 TABLET (4 MG TOTAL) BY MOUTH 2 (TWO) TIMES DAILY BEFORE A MEAL. 180 tablet 0  . glucose blood (FREESTYLE LITE) test strip Use as instructed 100 each 0  . Glucose Blood (FREESTYLE TEST VI) by In Vitro route.      Marland Kitchen glucose blood test strip Use to check blood sugar 1-2 times daily 100 each 3  . Insulin Pen Needle (CAREFINE PEN NEEDLES) 32G X 4 MM MISC Use to inject insulin daily 100 each 5  . losartan (COZAAR) 50 MG tablet TAKE 1 TABLET BY MOUTH EVERY DAY 90 tablet 0  . metFORMIN (GLUCOPHAGE) 1000 MG tablet TAKE 1 TABLET (1000 MG TOTAL) BY MOUTH 2 (TWO) TIMES DAILY WITH A MEAL. 180 tablet 1  . pioglitazone (ACTOS) 15 MG tablet TAKE 1 TABLET BY MOUTH EVERY DAY 90 tablet 0  . valsartan (DIOVAN) 160 MG tablet Take 1 tablet (160 mg total) by mouth daily. 30 tablet 3   No facility-administered medications prior to visit.     EXAM:  There were no vitals taken for this visit.  There is no height or weight on file to calculate BMI. Wt Readings from Last 3 Encounters:  11/30/18 169 lb (76.7 kg)  12/08/17 174 lb 8 oz (79.2 kg)  07/23/17 178 lb 9.6 oz (81 kg)    Physical Exam: Vital signs reviewed [id number] is a well-developed well-nourished alert cooperative    who appearsr stated age in no acute distress.  HEENT: normocephalic atraumatic , Eyes: PERRL EOM's full, conjunctiva clear, Nares: paten,t no deformity discharge or tenderness., Ears: no deformity EAC's clear TMs with normal landmarks. Mouth: clear OP, no lesions, edema.  Moist mucous membranes. Dentition in adequate repair. NECK: supple without masses, thyromegaly or bruits. CHEST/PULM:  Clear to auscultation and percussion breath sounds equal no wheeze , rales or rhonchi. No chest wall deformities or tenderness. Breast: normal by inspection . No dimpling, discharge, masses, tenderness or discharge . CV: PMI is nondisplaced, S1 S2 no gallops, murmurs, rubs. Peripheral pulses are full without delay.No JVD .  ABDOMEN: Bowel  sounds normal nontender  No guard or rebound, no hepato splenomegal no CVA tenderness.  No hernia. Extremtities:  No clubbing cyanosis or edema, no acute joint swelling or redness no focal atrophy NEURO:  Oriented x3, cranial nerves 3-12 appear to be intact, no obvious focal weakness,gait within normal limits no abnormal reflexes or asymmetrical SKIN: No acute  rashes normal turgor, color, no bruising or petechiae. PSYCH: Oriented, good eye contact, no obvious depression anxiety, cognition and judgment appear normal. LN: no cervical axillary inguinal adenopathy  Lab Results  Component Value Date   WBC 7.5 01/04/2019   HGB 16.0 (H) 01/04/2019   HCT 45.8 01/04/2019   PLT 359.0 01/04/2019   GLUCOSE 297 (H) 01/04/2019   CHOL 264 (H) 01/04/2019   TRIG 339.0 (H) 01/04/2019   HDL 65.80 01/04/2019   LDLDIRECT 149.0 01/04/2019   LDLCALC 73 10/09/2011   ALT 11 01/04/2019   AST 11 01/04/2019   NA 133 (L) 01/04/2019   K 4.4 01/04/2019   CL 97 01/04/2019   CREATININE 0.54 01/04/2019   BUN 11 01/04/2019   CO2 26 01/04/2019   TSH 1.46 01/04/2019   HGBA1C 10.7 (H) 01/04/2019   MICROALBUR <0.7 01/04/2019    BP Readings from Last 3 Encounters:  11/30/18 120/72  12/08/17 130/86  07/23/17 (!) 158/80    patient   ASSESSMENT AND PLAN:  Discussed the following assessment and plan:  No diagnosis found.  Patient Care Team: Allaina Brotzman, Standley Brooking, MD as PCP - General Haygood, Seymour Bars, MD (Inactive) as Attending Physician (Obstetrics and Gynecology) There are no Patient Instructions on file for this visit.  Standley Brooking. Kenedy Haisley M.D.

## 2019-03-09 ENCOUNTER — Telehealth: Payer: Self-pay

## 2019-03-09 NOTE — Telephone Encounter (Signed)
Unable to leave voicemail tried to call patient twice for missed cpe appt

## 2019-06-15 ENCOUNTER — Ambulatory Visit: Payer: 59 | Attending: Internal Medicine

## 2019-06-15 DIAGNOSIS — Z23 Encounter for immunization: Secondary | ICD-10-CM

## 2019-06-15 NOTE — Progress Notes (Signed)
   Covid-19 Vaccination Clinic  Name:  [name] [name]    MRN: [id number] DOB: May 15, 1950  06/15/2019  [name] [name] was observed post Covid-19 immunization for 15 minutes without incident. She was provided with Vaccine Information Sheet and instruction to access the V-Safe system.   [name] [name] was instructed to call 911 with any severe reactions post vaccine: Marland Kitchen Difficulty breathing  . Swelling of face and throat  . A fast heartbeat  . A bad rash all over body  . Dizziness and weakness   Immunizations Administered    Name Date Dose VIS Date Route   Pfizer COVID-19 Vaccine 06/15/2019 11:17 AM 0.3 mL 04/06/2018 Intramuscular   Manufacturer: [place]   Lot: [id number]   [place]: [id number]

## 2019-06-15 NOTE — Progress Notes (Signed)
   Covid-19 Vaccination Clinic  Name:  Tarra Bowermaster    MRN: [id number] DOB: 03-Jul-1950  06/15/2019  Ms. Kist was observed post Covid-19 immunization for 30 minutes based on pre-vaccination screening without incident. She was provided with Vaccine Information Sheet and instruction to access the V-Safe system.   Ms. Plager was instructed to call 911 with any severe reactions post vaccine: Marland Kitchen Difficulty breathing  . Swelling of face and throat  . A fast heartbeat  . A bad rash all over body  . Dizziness and weakness   Immunizations Administered    Name Date Dose VIS Date Route   Pfizer COVID-19 Vaccine 06/15/2019 11:17 AM 0.3 mL 04/06/2018 Intramuscular   Manufacturer: [place]   Lot: [id number]   [place]: [id number]

## 2019-10-12 ENCOUNTER — Other Ambulatory Visit: Payer: Self-pay | Admitting: Internal Medicine

## 2019-10-28 ENCOUNTER — Telehealth: Payer: Self-pay | Admitting: Internal Medicine

## 2019-10-28 NOTE — Telephone Encounter (Signed)
[employer] called to see if the patient is on a statin medication.   They suggest that patients over 53 and are diabetic that they should be taking a statin medication to reduce the risk of heart attack or a stroke.  If the patient is not taking a statin they would suggest discussing with the patient.  Please advise

## 2019-11-14 ENCOUNTER — Telehealth: Payer: Self-pay | Admitting: Internal Medicine

## 2019-11-14 NOTE — Telephone Encounter (Signed)
VM NOT SET UP. Pt due to schedule Medicare Annual Wellness Visit (AWV) either virtually or in office.  No hx; please schedule at anytime with [facility] Nurse Health Advisor 2.  This should be a 45 minute visit.

## 2020-02-16 DIAGNOSIS — Z20822 Contact with and (suspected) exposure to covid-19: Secondary | ICD-10-CM | POA: Diagnosis not present

## 2020-02-21 DIAGNOSIS — Z20822 Contact with and (suspected) exposure to covid-19: Secondary | ICD-10-CM | POA: Diagnosis not present

## 2020-02-21 DIAGNOSIS — Z1152 Encounter for screening for COVID-19: Secondary | ICD-10-CM | POA: Diagnosis not present

## 2020-03-14 ENCOUNTER — Telehealth: Payer: Self-pay | Admitting: Internal Medicine

## 2020-03-14 NOTE — Telephone Encounter (Signed)
Tried calling patient to  schedule Medicare Annual Wellness Visit (AWV) either virtually or in office.  No voicemail  Last AWV ; please schedule at anytime with [facility] Nurse Health Advisor 1 or 2   This should be a 45 minute visit. Patient also needs appointment with pcp last appointment 01/12/2019

## 2020-06-15 ENCOUNTER — Other Ambulatory Visit: Payer: Self-pay

## 2020-06-16 NOTE — Progress Notes (Signed)
Chief Complaint  Patient presents with  . Follow-up    HPI: [name] [name] 70 y.o. come in for Chronic disease management  Last seen 12 20 never followed up .   She has dm out t of control and  In past  Last labs ? Here   Lost job and insurance change off meds for months was on [employer] and that went to [employer] over a year ago.  She was concerned about using it and cost of medicines. havent tested blood sugar recently because she is out of strips She has been out of her medications for at least 3 to 6 months.  These include atorvastatin Trulicity Clement bromide losartan and metformin and Actos. Trying to avoid insulin because her insurance person said it was very expensive and other reasons When she was taking the Trulicity she had side effects of nausea and stomach not that it would keep her from continuing to take it but is out of it anyway.  This was at the 1.5 dose.  Remote history of endocrine referral that did not go well and would be willing to see someone else.  She is due for mammogram is interested in Cologuard for colon cancer screening had colonoscopy years ago.  Negative family history.   ROS: See pertinent positives and negatives per HPI.  Had some jaw pain Eliezer Lofts said it was in her jaw a year ago no teeth problems.  hh of  3  Son gs   Puppy in November  Got sick  .    Couldn't get a COVID test around Christmas 2021      Testing inconclusive after the fact late in illness. Got totally better   Cough  Lasted.  Was hard to get deep breath during toime she had COVID at that time.  Reports having had the initial 2 series of the [place]. Eyes are bad  Cataracts .     Last work  Feb 2021  Now  On SS.  And other limited income Activity :  aroiund yard with dog. Stairs.  Aching under rib cage comes and goes.   ? From coughing.  This in the past she no longer has. Left foot  With pain at night  Aching pain .  Burning.  Not bad when she is walking uncertain cause. Past  Medical History:  Diagnosis Date  . Allergic rhinitis   . Diabetes mellitus   . Hyperlipidemia   . Hypertension   . IBS (irritable bowel syndrome)   . Normal nuclear stress test 1 /08   low risk study   . Sciatica     Family History  Problem Relation Age of Onset  . Acute myelogenous leukemia Mother        died when pat age 3   . Diabetes Father   . Diabetes Paternal Grandfather   . Breast cancer Neg Hx     Social History   Socioeconomic History  . Marital status: Legally Separated    Spouse name: Not on file  . Number of children: Not on file  . Years of education: Not on file  . Highest education level: Not on file  Occupational History  . Not on file  Tobacco Use  . Smoking status: Never Smoker  . Smokeless tobacco: Never Used  Substance and Sexual Activity  . Alcohol use: No  . Drug use: No  . Sexual activity: Not on file  Other Topics Concern  . 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          Social Determinants of Health   Financial Resource Strain: Not on file  Food Insecurity: Not on file  Transportation Needs: Not on file  Physical Activity: Not on file  Stress: Not on file  Social Connections: Not on file    Outpatient Medications Prior to Visit  Medication Sig Dispense Refill  . FREESTYLE UNISTICK II LANCETS MISC Use to check blood sugar 1-2 times daily 100 each 3  . glimepiride (AMARYL) 4 MG tablet TAKE 1 TABLET (4 MG TOTAL) BY MOUTH 2 (TWO) TIMES DAILY BEFORE A MEAL. 180 tablet 0  . Glucose Blood (FREESTYLE TEST VI) by In Vitro route.    Marland Kitchen glucose blood test strip Use to check blood sugar 1-2 times daily 100 each 3  . Insulin Pen Needle (CAREFINE PEN NEEDLES) 32G X 4 MM MISC Use to inject  insulin daily 100 each 5  . losartan (COZAAR) 50 MG tablet TAKE 1 TABLET BY MOUTH EVERY DAY 90 tablet 0  . pioglitazone (ACTOS) 15 MG tablet TAKE 1 TABLET BY MOUTH EVERY DAY 90 tablet 0  . valsartan (DIOVAN) 160 MG tablet Take 1 tablet (160 mg total) by mouth daily. 30 tablet 3  . atorvastatin (LIPITOR) 40 MG tablet Take 1 tablet (40 mg total) by mouth daily. 90 tablet 1  . Dulaglutide (TRULICITY) 1.5 0000000 SOPN Inject 1.5 mg into the skin once a week. 4 pen 11  . glucose blood (FREESTYLE LITE) test strip Use as instructed 100 each 0  . metFORMIN (GLUCOPHAGE) 1000 MG tablet TAKE 1 TABLET (1000 MG TOTAL) BY MOUTH 2 (TWO) TIMES DAILY WITH A MEAL. 180 tablet 0   No facility-administered medications prior to visit.     EXAM:  BP 132/70 (BP Location: Left Arm, Patient Position: Sitting, Cuff Size: Normal)   Pulse (!) 118   Temp 98.3 F (36.8 C) (Oral)   Ht 5\' 4"  (1.626 m)   Wt 142 lb (64.4 kg)   SpO2 97%   BMI 24.37 kg/m   Body mass index is 24.37 kg/m. Wt Readings from Last 3 Encounters:  06/18/20 142 lb (64.4 kg)  11/30/18 169 lb (76.7 kg)  12/08/17 174 lb 8 oz (79.2 kg)    GENERAL: vitals reviewed and listed above, alert, oriented, appears well hydrated and in no acute distress HEENT: atraumatic, conjunctiva  clear, no obvious abnormalities on inspection of external nose and ears TMs are clear OP masked  NECK: no obvious masses on inspection palpation  LUNGS: clear to auscultation bilaterally, no wheezes, rales or rhonchi, good air movement CV: HRRR, rate 100 no gallops or murmurs no clubbing cyanosis or  peripheral edema nl cap refill  ? Dec dp pulse   Abdomen:  Sof,t normal bowel sounds without hepatosplenomegaly, no guarding rebound or masses no CVA tenderness MS: moves all extremities without noticeable focal  Abnormality  PSYCH: pleasant and cooperative, no obvious depression or anxiety Diabetic Foot Exam - Simple   Simple Foot Form Diabetic Foot exam was performed  with the following findings: Yes 06/18/2020  5:50 PM  Visual Inspection  No deformities, no ulcerations, no other skin breakdown bilaterally: Yes Sensation Testing Intact to touch and monofilament testing bilaterally: Yes Pulse Check Posterior Tibialis and Dorsalis pulse intact bilaterally: Yes See comments: Yes Comments Decrease   Skin somewhat shiny  No ulcer      Lab Results  Component Value Date   WBC 7.5 01/04/2019   HGB 16.0 (H) 01/04/2019   HCT 45.8 01/04/2019   PLT 359.0 01/04/2019   GLUCOSE 297 (H) 01/04/2019   CHOL 264 (H) 01/04/2019   TRIG 339.0 (H) 01/04/2019   HDL 65.80 01/04/2019   LDLDIRECT 149.0 01/04/2019   LDLCALC 73 10/09/2011   ALT 11 01/04/2019   AST 11 01/04/2019   NA 133 (L) 01/04/2019   K 4.4 01/04/2019   CL 97 01/04/2019   CREATININE 0.54 01/04/2019   BUN 11 01/04/2019   CO2 26 01/04/2019   TSH 1.46 01/04/2019   HGBA1C 10.7 (H) 01/04/2019   MICROALBUR <0.7 01/04/2019   BP Readings from Last 3 Encounters:  06/18/20 132/70  11/30/18 120/72  12/08/17 130/86   Diabetic Foot Exam - Simple   Simple Foot Form Diabetic Foot exam was performed with the following findings: Yes 06/18/2020  5:50 PM  Visual Inspection No deformities, no ulcerations, no other skin breakdown bilaterally: Yes Sensation Testing Intact to touch and monofilament testing bilaterally: Yes Pulse Check Posterior Tibialis and Dorsalis pulse intact bilaterally: Yes See comments: Yes Comments Decrease   Skin somewhat shiny  No ulcer       ASSESSMENT AND PLAN:  Discussed the following assessment and plan:  Uncontrolled type 2 diabetes mellitus with complication, without long-term current use of insulin (HCC) - Plan: Lipid panel, Basic metabolic panel, CBC with Differential/Platelet, Hemoglobin A1c, Hepatic function panel, TSH, T4, free, Microalbumin / creatinine urine ratio, DG Foot Complete Right  Hyperlipidemia, unspecified hyperlipidemia type - Plan: Lipid panel, Basic  metabolic panel, CBC with Differential/Platelet, Hemoglobin A1c, Hepatic function panel, TSH, T4, free, Microalbumin / creatinine urine ratio, DG Foot Complete Right  Pain in both feet - Plan: Lipid panel, Basic metabolic panel, CBC with Differential/Platelet, Hemoglobin A1c, Hepatic function panel, TSH, T4, free, Microalbumin / creatinine urine ratio, DG Foot Complete Right  Medication management - Plan: Lipid panel, Basic metabolic panel, CBC with Differential/Platelet, Hemoglobin A1c, Hepatic function panel, TSH, T4, free, Microalbumin / creatinine urine ratio, DG Foot Complete Right  Essential hypertension - Plan: Lipid panel, Basic metabolic panel, CBC with Differential/Platelet, Hemoglobin A1c, Hepatic function panel, TSH, T4, free, Microalbumin / creatinine urine ratio, DG Foot Complete Right  Intentional underdosing of medication regimen by patient due to financial hardship - on ss and medicare   NON ADHERENCE  failure to follow up  - Multifactorial tori will financial included Unfortunately ongoing failure to follow-up in a timely manner as in past with metabolic factors not in control financial and cost is a big factor. Today her exam is fairly benign except her heart rate is around 100 she feels that that might be from anxiety her blood pressure is in range we will not start back her losartan yet Refilled her strips the atorvastatin the metformin to start low and ramp-up Trulicity that she can use a half dose I would advise her getting a blood pressure monitor that may save her time and money in the long run. We will plan on getting a Cologuard. Depending on labs and blood pressure will probably refill her losartan also. She will get a fasting lab appointment for tomorrow I will  be out of the office for 2 weeks beginning end of the week Uncertain how to help and what the barriers are to proceeding with appropriate full therapies.  Will have chronic care management reach out to her  that may be helpful in regard to medications.  Either way our OV in 3 months. We will review her chart in regard to her foot pain that may be joint arthritis.  Leaves she has had an ABI in the past that was normal.  2019.nl  -Patient advised to return or notify health care team  if  new concerns arise.  Patient Instructions  Get fasting lab appointment this week. Will send in metformin Trulicity to start until I get the results And reconsider endocrine appointment Try lower dose of Trulicity. You will need to be on a statin medicine It looks like he tolerated the Lipitor in the past. Consideration of medicine such as Jardiance or Wilder Glade which have a good track record and diabetes. Get your mammogram we will do an order for Cologuard colon cancer screening Plan follow-up visit in 3 months or earlier depending on what your results are. I will review your record in regard to your foot pain get an x-ray of your right foot with lab work.   Standley Brooking. Cydney Alvarenga M.D.

## 2020-06-18 ENCOUNTER — Other Ambulatory Visit: Payer: Self-pay

## 2020-06-18 ENCOUNTER — Other Ambulatory Visit: Payer: Self-pay | Admitting: Internal Medicine

## 2020-06-18 ENCOUNTER — Encounter: Payer: Self-pay | Admitting: Internal Medicine

## 2020-06-18 ENCOUNTER — Ambulatory Visit (INDEPENDENT_AMBULATORY_CARE_PROVIDER_SITE_OTHER): Payer: Medicare HMO | Admitting: Internal Medicine

## 2020-06-18 VITALS — BP 132/70 | HR 118 | Temp 98.3°F | Ht 64.0 in | Wt 142.0 lb

## 2020-06-18 DIAGNOSIS — Z79899 Other long term (current) drug therapy: Secondary | ICD-10-CM | POA: Diagnosis not present

## 2020-06-18 DIAGNOSIS — Z91120 Patient's intentional underdosing of medication regimen due to financial hardship: Secondary | ICD-10-CM | POA: Diagnosis not present

## 2020-06-18 DIAGNOSIS — E1165 Type 2 diabetes mellitus with hyperglycemia: Secondary | ICD-10-CM

## 2020-06-18 DIAGNOSIS — M79671 Pain in right foot: Secondary | ICD-10-CM

## 2020-06-18 DIAGNOSIS — Z91199 Patient's noncompliance with other medical treatment and regimen due to unspecified reason: Secondary | ICD-10-CM

## 2020-06-18 DIAGNOSIS — IMO0002 Reserved for concepts with insufficient information to code with codable children: Secondary | ICD-10-CM

## 2020-06-18 DIAGNOSIS — E785 Hyperlipidemia, unspecified: Secondary | ICD-10-CM

## 2020-06-18 DIAGNOSIS — Z9119 Patient's noncompliance with other medical treatment and regimen: Secondary | ICD-10-CM | POA: Diagnosis not present

## 2020-06-18 DIAGNOSIS — M79672 Pain in left foot: Secondary | ICD-10-CM

## 2020-06-18 DIAGNOSIS — I10 Essential (primary) hypertension: Secondary | ICD-10-CM

## 2020-06-18 DIAGNOSIS — E118 Type 2 diabetes mellitus with unspecified complications: Secondary | ICD-10-CM | POA: Diagnosis not present

## 2020-06-18 DIAGNOSIS — Z1211 Encounter for screening for malignant neoplasm of colon: Secondary | ICD-10-CM | POA: Diagnosis not present

## 2020-06-18 MED ORDER — METFORMIN HCL 1000 MG PO TABS: ORAL_TABLET | ORAL | 0 refills | Status: DC

## 2020-06-18 MED ORDER — TRULICITY 1.5 MG/0.5ML ~~LOC~~ SOAJ: SUBCUTANEOUS | 3 refills | Status: DC

## 2020-06-18 MED ORDER — FREESTYLE LITE TEST VI STRP: ORAL_STRIP | 0 refills | Status: DC

## 2020-06-18 MED ORDER — ATORVASTATIN CALCIUM 40 MG PO TABS: 40.0000 mg | ORAL_TABLET | Freq: Every day | ORAL | 0 refills | Status: DC

## 2020-06-18 NOTE — Telephone Encounter (Signed)
Please see message from pharmacy.

## 2020-06-18 NOTE — Patient Instructions (Signed)
Get fasting lab appointment this week. Will send in metformin Trulicity to start until I get the results And reconsider endocrine appointment Try lower dose of Trulicity. You will need to be on a statin medicine It looks like he tolerated the Lipitor in the past. Consideration of medicine such as Jardiance or Wilder Glade which have a good track record and diabetes. Get your mammogram we will do an order for Cologuard colon cancer screening Plan follow-up visit in 3 months or earlier depending on what your results are. I will review your record in regard to your foot pain get an x-ray of your right foot with lab work.

## 2020-06-18 NOTE — Telephone Encounter (Signed)
Insurance requesting alternative for test strips

## 2020-06-19 ENCOUNTER — Other Ambulatory Visit: Payer: Self-pay

## 2020-06-19 MED ORDER — TRULICITY 0.75 MG/0.5ML ~~LOC~~ SOAJ: 0.7500 mg | SUBCUTANEOUS | 5 refills | Status: DC

## 2020-06-19 NOTE — Telephone Encounter (Signed)
Okay with me to change strips but contact patient and make sure she has the correct machine.  Send in covered strips box of 100 refill as needed

## 2020-06-19 NOTE — Telephone Encounter (Signed)
So consult with patient and pharmacy    Sounds like they may not pay for the freestyle lite   And they only cover a certain machine  given her  Whatever is best for her.

## 2020-06-19 NOTE — Telephone Encounter (Signed)
Prescription sent to pharmacy for 0.75mg  of Trulicity.

## 2020-06-19 NOTE — Telephone Encounter (Signed)
Called and spoke with patient, she has Freestyle lancets, machine, and test strips.   Not Accu check.

## 2020-06-19 NOTE — Addendum Note (Signed)
Addended by: Nilda Riggs on: 06/19/2020 11:51 AM   Modules accepted: Orders

## 2020-06-19 NOTE — Telephone Encounter (Signed)
Okay please send in 4.82 mg dose of Trulicity to take 1 subcu per week dispense enough for 1 month refill x5

## 2020-06-20 ENCOUNTER — Ambulatory Visit (INDEPENDENT_AMBULATORY_CARE_PROVIDER_SITE_OTHER): Payer: Medicare HMO

## 2020-06-20 ENCOUNTER — Other Ambulatory Visit (INDEPENDENT_AMBULATORY_CARE_PROVIDER_SITE_OTHER): Payer: Medicare HMO

## 2020-06-20 ENCOUNTER — Telehealth: Payer: Self-pay

## 2020-06-20 ENCOUNTER — Other Ambulatory Visit: Payer: Self-pay

## 2020-06-20 ENCOUNTER — Other Ambulatory Visit: Payer: Self-pay | Admitting: Internal Medicine

## 2020-06-20 DIAGNOSIS — M79671 Pain in right foot: Secondary | ICD-10-CM

## 2020-06-20 DIAGNOSIS — IMO0002 Reserved for concepts with insufficient information to code with codable children: Secondary | ICD-10-CM

## 2020-06-20 DIAGNOSIS — E118 Type 2 diabetes mellitus with unspecified complications: Secondary | ICD-10-CM

## 2020-06-20 DIAGNOSIS — M19072 Primary osteoarthritis, left ankle and foot: Secondary | ICD-10-CM | POA: Diagnosis not present

## 2020-06-20 DIAGNOSIS — M7732 Calcaneal spur, left foot: Secondary | ICD-10-CM | POA: Diagnosis not present

## 2020-06-20 DIAGNOSIS — E1165 Type 2 diabetes mellitus with hyperglycemia: Secondary | ICD-10-CM

## 2020-06-20 DIAGNOSIS — Z79899 Other long term (current) drug therapy: Secondary | ICD-10-CM | POA: Diagnosis not present

## 2020-06-20 DIAGNOSIS — I10 Essential (primary) hypertension: Secondary | ICD-10-CM

## 2020-06-20 DIAGNOSIS — E785 Hyperlipidemia, unspecified: Secondary | ICD-10-CM

## 2020-06-20 DIAGNOSIS — M79672 Pain in left foot: Secondary | ICD-10-CM

## 2020-06-20 LAB — CBC WITH DIFFERENTIAL/PLATELET
Basophils Absolute: 0.1 10*3/uL (ref 0.0–0.1)
Basophils Relative: 1.7 % (ref 0.0–3.0)
Eosinophils Absolute: 0.2 10*3/uL (ref 0.0–0.7)
Eosinophils Relative: 2.9 % (ref 0.0–5.0)
HCT: 45 % (ref 36.0–46.0)
Hemoglobin: 15.8 g/dL — ABNORMAL HIGH (ref 12.0–15.0)
Lymphocytes Relative: 37.2 % (ref 12.0–46.0)
Lymphs Abs: 2.3 10*3/uL (ref 0.7–4.0)
MCHC: 35.1 g/dL (ref 30.0–36.0)
MCV: 87.7 fl (ref 78.0–100.0)
Monocytes Absolute: 0.5 10*3/uL (ref 0.1–1.0)
Monocytes Relative: 7.5 % (ref 3.0–12.0)
Neutro Abs: 3.1 10*3/uL (ref 1.4–7.7)
Neutrophils Relative %: 50.7 % (ref 43.0–77.0)
Platelets: 321 10*3/uL (ref 150.0–400.0)
RBC: 5.13 Mil/uL — ABNORMAL HIGH (ref 3.87–5.11)
RDW: 12.6 % (ref 11.5–15.5)
WBC: 6.2 10*3/uL (ref 4.0–10.5)

## 2020-06-20 LAB — BASIC METABOLIC PANEL
BUN: 12 mg/dL (ref 6–23)
CO2: 26 mEq/L (ref 19–32)
Calcium: 9.1 mg/dL (ref 8.4–10.5)
Chloride: 100 mEq/L (ref 96–112)
Creatinine, Ser: 0.55 mg/dL (ref 0.40–1.20)
GFR: 94.4 mL/min (ref >60.00)
Glucose, Bld: 339 mg/dL — ABNORMAL HIGH (ref 70–99)
Potassium: 4.2 mEq/L (ref 3.5–5.1)
Sodium: 135 mEq/L (ref 135–145)

## 2020-06-20 LAB — LIPID PANEL
Cholesterol: 267 mg/dL — ABNORMAL HIGH (ref 0–200)
HDL: 64.8 mg/dL (ref >39.00)
NonHDL: 202.25
Total CHOL/HDL Ratio: 4
Triglycerides: 261 mg/dL — ABNORMAL HIGH (ref 0.0–149.0)
VLDL: 52.2 mg/dL — ABNORMAL HIGH (ref 0.0–40.0)

## 2020-06-20 LAB — LDL CHOLESTEROL, DIRECT: Direct LDL: 155 mg/dL

## 2020-06-20 LAB — TSH: TSH: 1.61 u[IU]/mL (ref 0.35–4.50)

## 2020-06-20 LAB — HEPATIC FUNCTION PANEL
ALT: 11 U/L (ref 0–35)
AST: 12 U/L (ref 0–37)
Albumin: 3.9 g/dL (ref 3.5–5.2)
Alkaline Phosphatase: 75 U/L (ref 39–117)
Bilirubin, Direct: 0.1 mg/dL (ref 0.0–0.3)
Total Bilirubin: 0.8 mg/dL (ref 0.2–1.2)
Total Protein: 6.1 g/dL (ref 6.0–8.3)

## 2020-06-20 LAB — MICROALBUMIN / CREATININE URINE RATIO
Creatinine,U: 51.7 mg/dL
Microalb Creat Ratio: 1.4 mg/g (ref 0.0–30.0)
Microalb, Ur: <0.7 mg/dL (ref 0.0–1.9)

## 2020-06-20 LAB — T4, FREE: Free T4: 1.04 ng/dL (ref 0.60–1.60)

## 2020-06-20 LAB — HEMOGLOBIN A1C: Hgb A1c MFr Bld: 12.3 % — ABNORMAL HIGH (ref 4.6–6.5)

## 2020-06-20 NOTE — Telephone Encounter (Signed)
So send in the strips corrected if you have not already done so please

## 2020-06-20 NOTE — Telephone Encounter (Signed)
Apologizes not being more descriptive.  I verified the products the patient was using which is Freestyle.    Reverified with the pharmacy about coverage.     All new prescriptions will have to be sent to the pharmacy for AccuCheck, meter, lancets, test strips, so insurance will pay for these products.   When I spoke with patient on 06/19/2020, I informed her that new prescriptions will be sent for everything since insurance will not cover.

## 2020-06-20 NOTE — Progress Notes (Signed)
So kidney function is good cholesterol not but you have just started the atorvastatin again hemoglobin A1c is extremely high at 12.3   rest of labs are basically unremarkable. I want you to try seeing endocrinology as we discussed.    Please refer to Dr. Delrae Rend. [place]  Dx diabetes ,hyperlipidemia Send in blood sugar readings after 3 to 4 weeks back on Trulicity and metformin Please send in a refill of losartan 50 mg once a day 90 refill x1 Keep appt with me

## 2020-06-20 NOTE — Telephone Encounter (Signed)
-----   Message from Viona Gilmore, [facility] sent at 06/20/2020  8:27 AM EDT ----- Regarding: CCM referral Hi,  Can you please put in a CCM referral for Ms. Desjardin?  Thank you! Maddie

## 2020-06-21 ENCOUNTER — Other Ambulatory Visit: Payer: Self-pay

## 2020-06-21 MED ORDER — ACCU-CHEK GUIDE VI STRP: ORAL_STRIP | 6 refills | Status: DC

## 2020-06-21 MED ORDER — ACCU-CHEK SOFTCLIX LANCETS MISC: 6 refills | Status: DC

## 2020-06-21 MED ORDER — ACCU-CHEK GUIDE W/DEVICE KIT: PACK | 0 refills | Status: AC

## 2020-06-21 MED ORDER — LOSARTAN POTASSIUM 50 MG PO TABS: 50.0000 mg | ORAL_TABLET | Freq: Every day | ORAL | 1 refills | Status: DC

## 2020-06-21 NOTE — Telephone Encounter (Signed)
Spoke with pharmacist at [facility] on [address]  He suggested to send in Accu view guide machine, with test strips and lancets.  New prescriptions sent to pharmacy.   Called patient to make aware that new prescriptions for device with supplies has been sent to the pharmacy.

## 2020-06-27 ENCOUNTER — Telehealth: Payer: Self-pay | Admitting: Internal Medicine

## 2020-06-27 NOTE — Progress Notes (Signed)
  Chronic Care Management   Outreach Note  06/27/2020 Name: [name] [name] MRN: [id number] DOB: Sep 05, 1950  Referred by: Burnis Medin, MD Reason for referral : No chief complaint on file.   An unsuccessful telephone outreach was attempted today. The patient was referred to the pharmacist for assistance with care management and care coordination.   Follow Up Plan:   [place]

## 2020-06-29 IMAGING — MG DIGITAL SCREENING BILATERAL MAMMOGRAM WITH TOMO AND CAD
3 series · 3 of 7 positions shown · non-contrast
Comparison: Previous exam(s).

CLINICAL DATA: Screening.

EXAM:
DIGITAL SCREENING BILATERAL MAMMOGRAM WITH TOMO AND CAD

[R MLO synth-2D]
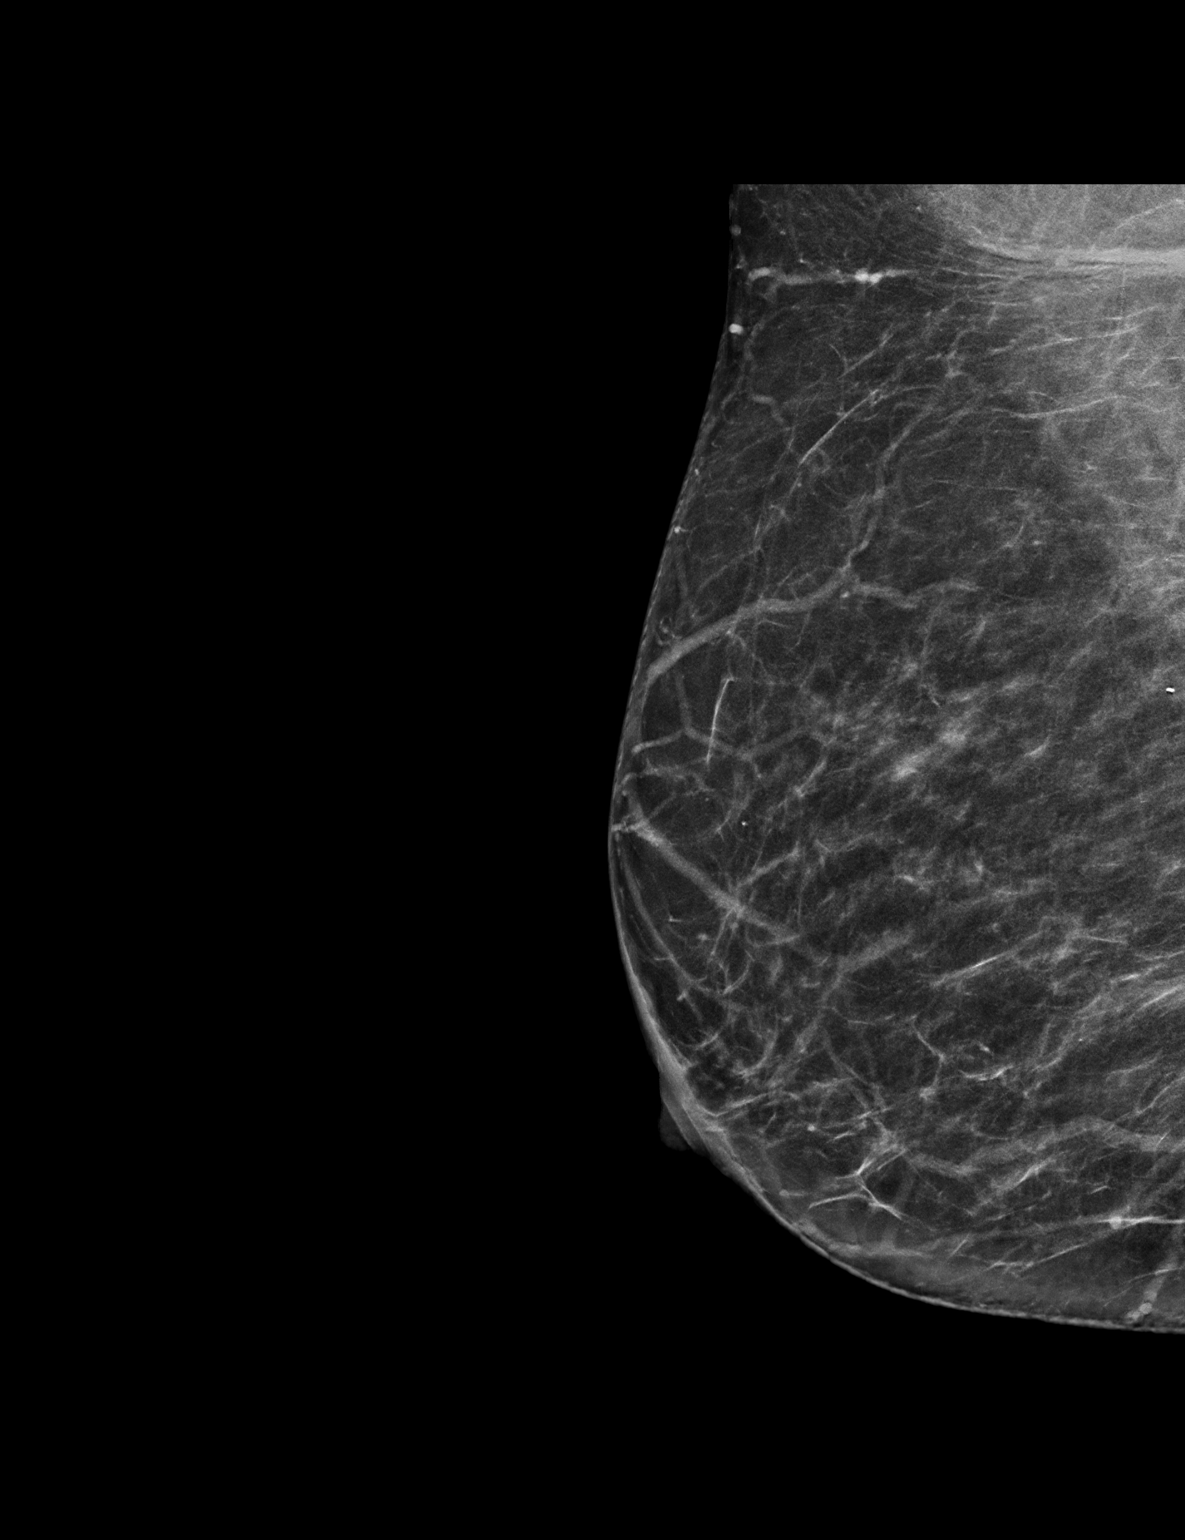

[L CC synth-2D]
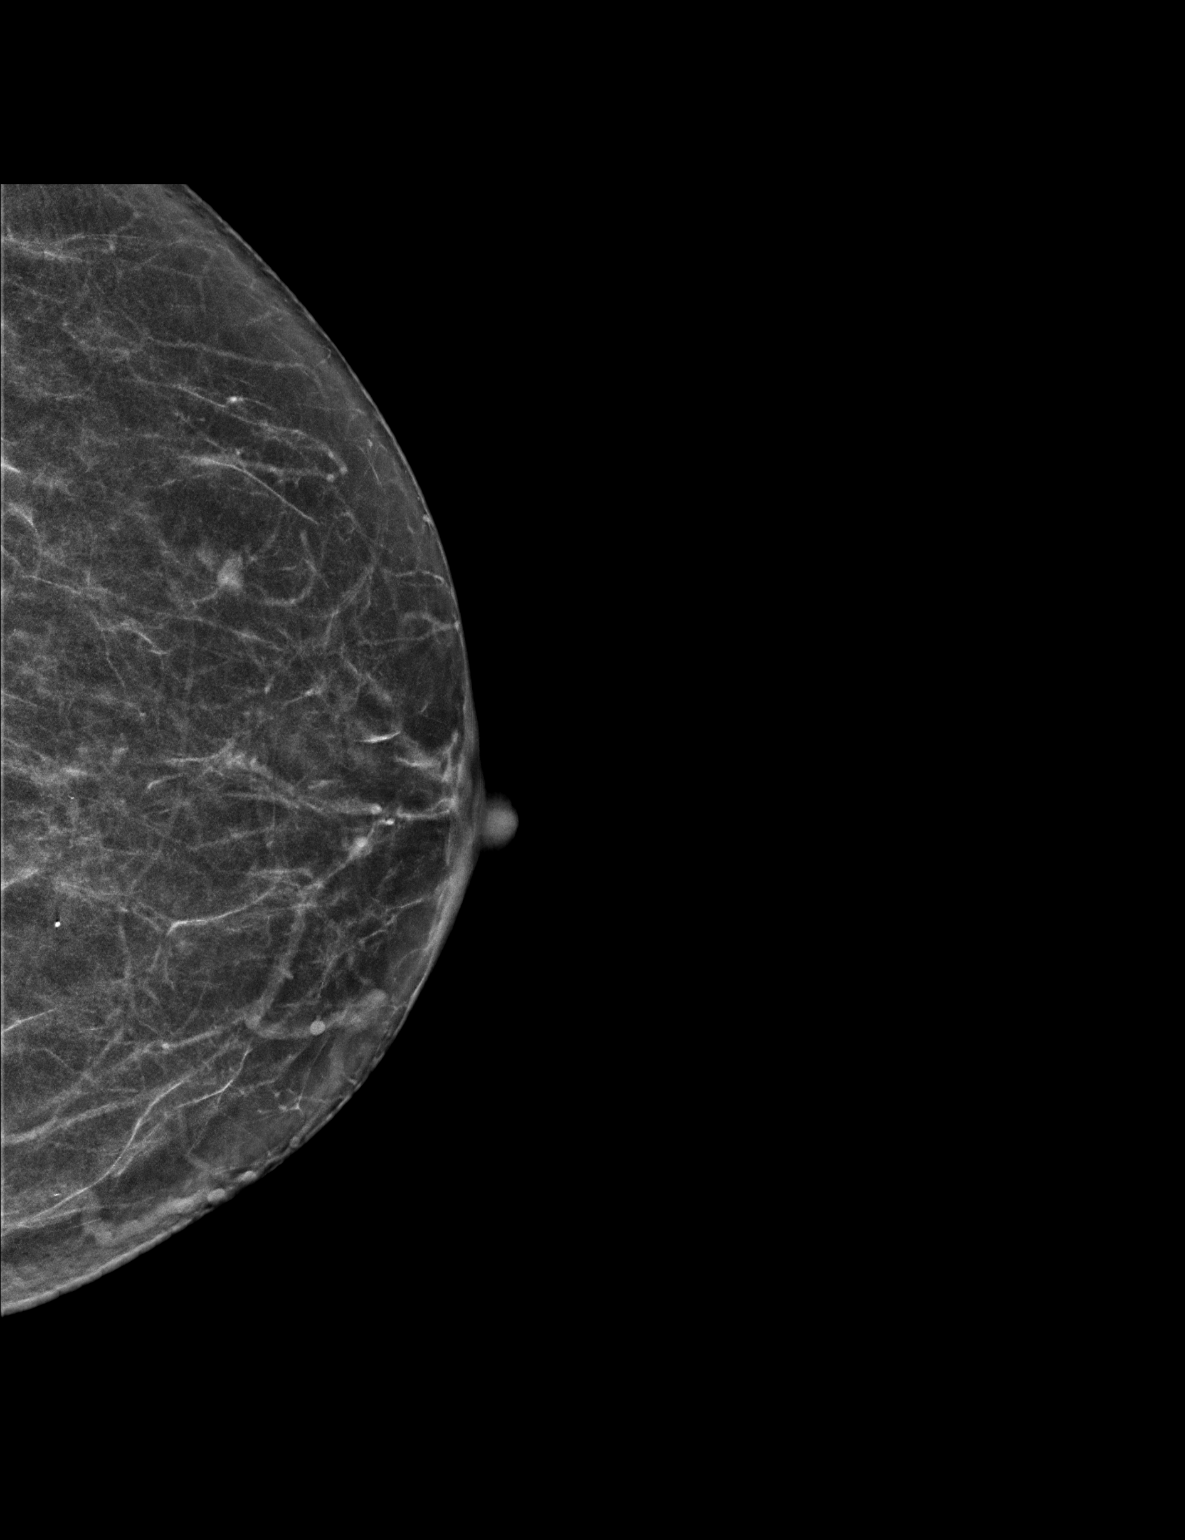

[R MLO tomo · tomo slice 28/55.0]
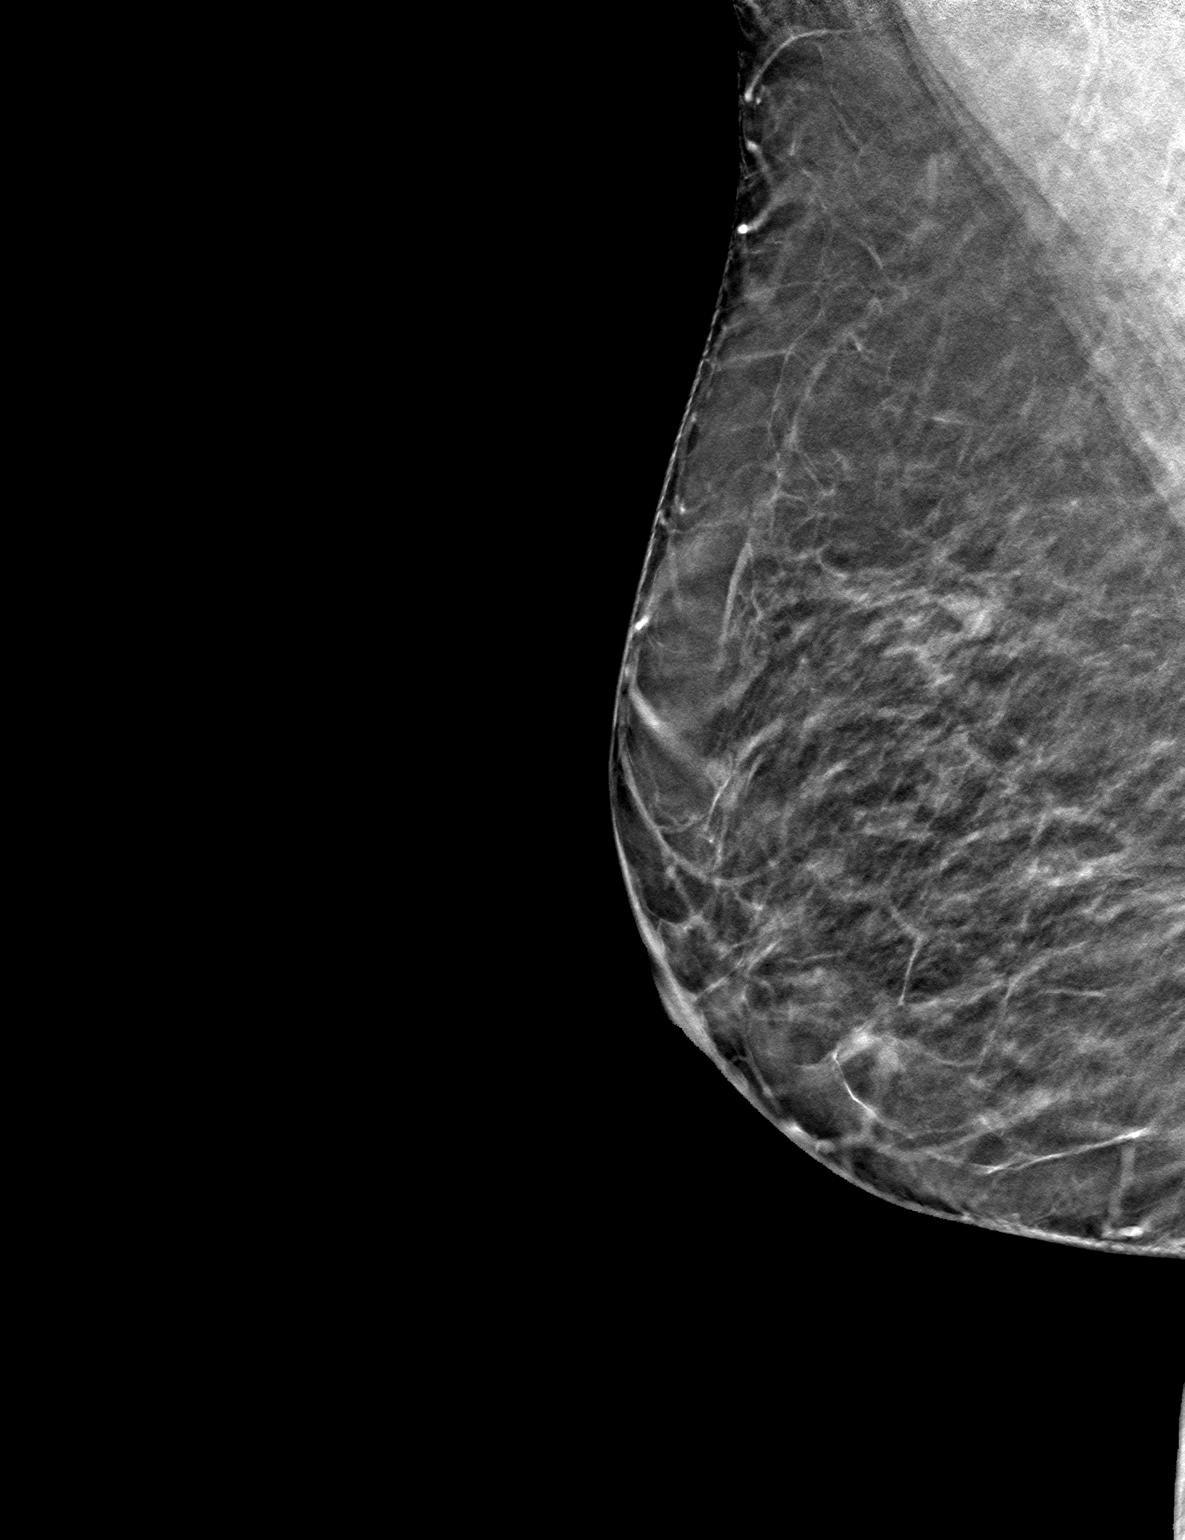

[3 of 7 positions shown; findings below may reference images not displayed]

ACR Breast Density Category b: There are scattered areas of
fibroglandular density.
FINDINGS: There are no findings suspicious for malignancy. Images were
processed with CAD.
IMPRESSION: No mammographic evidence of malignancy. A result letter of this
screening mammogram will be mailed directly to the patient.

RECOMMENDATION:
Screening mammogram in one year. (Code:[id number])

BI-RADS CATEGORY  1: Negative.

## 2020-07-09 NOTE — Progress Notes (Signed)
X ray shows no fracture  mild arthritis on big toe joint.

## 2020-08-16 ENCOUNTER — Encounter: Payer: Self-pay | Admitting: Internal Medicine

## 2020-08-23 ENCOUNTER — Telehealth: Payer: Self-pay | Admitting: Internal Medicine

## 2020-08-23 NOTE — Progress Notes (Signed)
  Chronic Care Management   Note  08/23/2020 Name: Lynsie Mcwatters MRN: [id number] DOB: 1950/07/30  Temesha Queener is a 70 y.o. year old female who is a primary care patient of Panosh, Standley Brooking, MD. I reached out to [name] by phone today in response to a referral sent by [name] PCP, Panosh, Standley Brooking, MD.   Ms. Sagrero was given information about Chronic Care Management services today including:  CCM service includes personalized support from designated clinical staff supervised by her physician, including individualized plan of care and coordination with other care providers 24/7 contact phone numbers for assistance for urgent and routine care needs. Service will only be billed when office clinical staff spend 20 minutes or more in a month to coordinate care. Only one practitioner may furnish and bill the service in a calendar month. The patient may stop CCM services at any time (effective at the end of the month) by phone call to the office staff.   Patient did not agree to enrollment in care management services and does not wish to consider at this time.  Follow up plan:   Tatjana Secretary/administrator

## 2020-08-24 ENCOUNTER — Other Ambulatory Visit: Payer: Self-pay | Admitting: Internal Medicine

## 2020-08-28 ENCOUNTER — Telehealth: Payer: Self-pay

## 2020-08-28 NOTE — Telephone Encounter (Signed)
Attempted to contact pt in regards to incomplete cologuard. Could not leave voice message due to mailbox not being set up.

## 2020-09-18 ENCOUNTER — Ambulatory Visit: Payer: Medicare HMO | Admitting: Internal Medicine

## 2020-09-25 ENCOUNTER — Ambulatory Visit: Payer: Medicare HMO | Admitting: Internal Medicine

## 2020-10-09 ENCOUNTER — Ambulatory Visit: Payer: Medicare HMO | Admitting: Internal Medicine

## 2020-11-04 NOTE — Progress Notes (Signed)
Chief Complaint  Patient presents with   Follow-up     HPI: [name] [name] 70 y.o. come in for Chronic disease management   Last  dm referral was not completed because provider was not taking new patients  Upper abd issues.  Times epigastric but no vomiting or diarrhea Has Cologuard kit but has not sent it in yet. Has lost a good bit of weight over the last 3 to 6 months and is somewhat concerned. Blood sugars recently have been in the high 200s to 300 no matter what she does. Eye check she does have cataracts harder to see No syncope chest pain shortness of breath or neuropathic symptoms. Taking metformin 1000 mg in the morning and 1000 mg 3 to 4 days a week when she remembers in the afternoon.  She is taking Trulicity more recently in the past month 0.75 causes some nausea early satiety for a couple days but no vomiting.  Had not been taking it regularly when she was traveling. HLD taking Lipitor every day had not been so much before the previous labs. Blood pressure stable. Is not taking glimepiride even though it was on her list in the past has not taken it for a while was never refilled. Having problems with sleep for a long time insomnia sometimes stays up thinks it is related to worry wonders if she can try melatonin.   ROS: See pertinent positives and negatives per HPI.  Past Medical History:  Diagnosis Date   Allergic rhinitis    Diabetes mellitus    Hyperlipidemia    Hypertension    IBS (irritable bowel syndrome)    Normal nuclear stress test 1 /08   low risk study    Sciatica     Family History  Problem Relation Age of Onset   Acute myelogenous leukemia Mother        died when pat age 76    Diabetes Father    Diabetes Paternal Grandfather    Breast cancer Neg Hx     Social History   Socioeconomic History   Marital status: Legally Separated    Spouse name: Not on file   Number of children: Not on file   Years of education: Not on file   Highest  education level: Not on file  Occupational History   Not on file  Tobacco Use   Smoking status: Never   Smokeless tobacco: Never  Substance and Sexual Activity   Alcohol use: No   Drug use: No   Sexual activity: Not on file  Other Topics Concern   Not on file  Social History Narrative   Separated single mom   HH of 4   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          Social Determinants of Health   Financial Resource Strain: Not on file  Food Insecurity: Not on file  Transportation Needs: Not on file  Physical Activity: Not on file  Stress: Not on file  Social Connections: Not on file    Outpatient Medications Prior to Visit  Medication Sig Dispense Refill   Accu-Chek Softclix Lancets lancets Use as instructed 100 each 6   atorvastatin (LIPITOR) 40 MG tablet TAKE 1 TABLET  BY MOUTH EVERY DAY 90 tablet 1   Blood Glucose Monitoring Suppl (ACCU-CHEK GUIDE) w/Device KIT Daily use 1 kit 0   Dulaglutide (TRULICITY) 1.95 KD/3.2IZ SOPN Inject 0.75 mg into the skin once a week. 0.5 mL 5   FREESTYLE UNISTICK II LANCETS MISC Use to check blood sugar 1-2 times daily 100 each 3   glimepiride (AMARYL) 4 MG tablet TAKE 1 TABLET (4 MG TOTAL) BY MOUTH 2 (TWO) TIMES DAILY BEFORE A MEAL. 180 tablet 0   glucose blood (ACCU-CHEK GUIDE) test strip Use 1-2 times daily 100 each 6   glucose blood (FREESTYLE LITE) test strip Use as instructed 100 each 0   Glucose Blood (FREESTYLE TEST VI) by In Vitro route.     glucose blood test strip Use to check blood sugar 1-2 times daily 100 each 3   Insulin Pen Needle (CAREFINE PEN NEEDLES) 32G X 4 MM MISC Use to inject insulin daily 100 each 5   losartan (COZAAR) 50 MG tablet TAKE 1 TABLET BY MOUTH EVERY DAY 90 tablet 0   losartan (COZAAR) 50 MG tablet Take 1 tablet (50 mg total) by  mouth daily. 90 tablet 1   metFORMIN (GLUCOPHAGE) 1000 MG tablet Take 500 mg twice a day increase to 1500 mg a day and eventually 2000 mg a day over 2 to 3 weeks. 180 tablet 0   pioglitazone (ACTOS) 15 MG tablet TAKE 1 TABLET BY MOUTH EVERY DAY 90 tablet 0   valsartan (DIOVAN) 160 MG tablet Take 1 tablet (160 mg total) by mouth daily. 30 tablet 3   No facility-administered medications prior to visit.     EXAM:  BP 122/68 (BP Location: Left Arm, Patient Position: Sitting, Cuff Size: Normal)   Pulse (!) 112   Temp 97.7 F (36.5 C) (Oral)   Ht 5' 4"  (1.626 m)   Wt 127 lb 12.8 oz (58 kg)   SpO2 98%   BMI 21.94 kg/m   Body mass index is 21.94 kg/m. Wt Readings from Last 3 Encounters:  11/05/20 127 lb 12.8 oz (58 kg)  06/18/20 142 lb (64.4 kg)  11/30/18 169 lb (76.7 kg)    GENERAL: vitals reviewed and listed above, alert, oriented, appears well hydrated and in no acute distress HEENT: atraumatic, conjunctiva  clear, no obvious abnormalities on inspection of external nose and ears OP : Masked NECK: no obvious masses on inspection palpation  LUNGS: clear to auscultation bilaterally, no wheezes, rales or rhonchi, good air movement Abdomen soft without again megaly guarding or rebound obvious no ascites. Skin color normal CV: HRRR, no clubbing cyanosis or  peripheral edema nl cap refill  MS: moves all extremities without noticeable focal  abnormality PSYCH: pleasant and cooperative, no obvious depression or anxiety Lab Results  Component Value Date   WBC 6.2 06/20/2020   HGB 15.8 (H) 06/20/2020   HCT 45.0 06/20/2020   PLT 321.0 06/20/2020   GLUCOSE 343 (H) 11/05/2020   CHOL 204 (H) 11/05/2020   TRIG 315.0 (H) 11/05/2020   HDL 61.20 11/05/2020   LDLDIRECT 104.0 11/05/2020   LDLCALC 73 10/09/2011   ALT 7 11/05/2020   AST 10 11/05/2020   NA 134 (L) 11/05/2020   K 4.3 11/05/2020   CL 100 11/05/2020   CREATININE 0.58 11/05/2020   BUN 10 11/05/2020   CO2 25 11/05/2020   TSH  1.61 06/20/2020   HGBA1C 11.0 (H) 11/05/2020   MICROALBUR <0.7 06/20/2020   BP Readings from Last 3 Encounters:  11/05/20 122/68  06/18/20 132/70  11/30/18 120/72   Updated labs today she is not fasting had a small piece of toast and something earlier ASSESSMENT AND PLAN:  Discussed the following assessment and plan:  Type 2 diabetes mellitus with hyperglycemia, without long-term current use of insulin ([place]) - Plan: Hemoglobin O1L, Basic metabolic panel, Lipid panel, Hepatic function panel, Hepatic function panel, Lipid panel, Basic metabolic panel, Hemoglobin A1c, Ambulatory referral to Endocrinology  Essential hypertension - Plan: Hemoglobin X7W, Basic metabolic panel, Lipid panel, Hepatic function panel, Hepatic function panel, Lipid panel, Basic metabolic panel, Hemoglobin A1c  Hyperlipidemia, unspecified hyperlipidemia type - Plan: Hepatic function panel, Hepatic function panel  Medication management - Plan: Hepatic function panel, Hepatic function panel, Ambulatory referral to Endocrinology  Need for influenza vaccination - Plan: Flu Vaccine QUAD High Dose(Fluad)  Weight loss Out of controll dm concern about  causing weight loss   disc about insulin short term . Needs to be  pen type.  To be able to adjsut and if by self concern about hypoglycemia he is quite reluctant about insulin but reassured low-dose 24 hours would be low risk She is not on the SU for a while.  May need to look for other causes of weight loss. Get her cologuard  done ( pending) . 2 os fu either way  weight and sleep etc.  Follow-up 2 months or earlier depending on help with her diabetes. -Patient advised to return or notify health care team  if  new concerns arise.  Patient Instructions  I am concerned about your  weight loss being from  out of control diabetes.  We will do a new endocrine ereferral.  As discussed . I suggest we add temporally a low dose basal insulin in interim in addition to the  other meds   in interm to get bg below 250 200 .  Will begin  depending o on lab but suspect we would add  10 units every 24 hours  of basal  24 insulin to avoid peaks and valleys.  Stay hydrated  in interim.  Sleep issues   may consider  other intervention   after bg  on the way to improvement .  Plan  FU with me depending on labs   plan   or 2 months  about the sleep.    Insomnia Insomnia is a sleep disorder that makes it difficult to fall asleep or stay asleep. Insomnia can cause fatigue, low energy, difficulty concentrating, mood swings, and poor performance at work or school. There are three different ways to classify insomnia: Difficulty falling asleep. Difficulty staying asleep. Waking up too early in the morning. Any type of insomnia can be long-term (chronic) or short-term (acute). Both are common. Short-term insomnia usually lasts for three months or less. Chronic insomnia occurs at least three times a week for longer than three months. What are the causes? Insomnia may be caused by another condition, situation, or substance, such as: Anxiety. Certain medicines. Gastroesophageal reflux disease (GERD) or other gastrointestinal conditions. Asthma or other breathing conditions. Restless legs syndrome, sleep apnea, or other sleep disorders. Chronic pain. Menopause. Stroke. Abuse of alcohol, tobacco, or illegal drugs. Mental health conditions, such as depression. Caffeine. Neurological disorders, such as Alzheimer's disease. An overactive thyroid (hyperthyroidism). Sometimes, the cause of insomnia may not be known. What increases the risk? Risk factors for insomnia include: Gender. Women are affected more often than men. Age. Insomnia is more common as you get older. Stress. Lack of exercise. Irregular  work schedule or working night shifts. Traveling between different time zones. Certain medical and mental health conditions. What are the signs or symptoms? If you have  insomnia, the main symptom is having trouble falling asleep or having trouble staying asleep. This may lead to other symptoms, such as: Feeling fatigued or having low energy. Feeling nervous about going to sleep. Not feeling rested in the morning. Having trouble concentrating. Feeling irritable, anxious, or depressed. How is this diagnosed? This condition may be diagnosed based on: Your symptoms and medical history. Your health care provider may ask about: Your sleep habits. Any medical conditions you have. Your mental health. A physical exam. How is this treated? Treatment for insomnia depends on the cause. Treatment may focus on treating an underlying condition that is causing insomnia. Treatment may also include: Medicines to help you sleep. Counseling or therapy. Lifestyle adjustments to help you sleep better. Follow these instructions at home: Eating and drinking  Limit or avoid alcohol, caffeinated beverages, and cigarettes, especially close to bedtime. These can disrupt your sleep. Do not eat a large meal or eat spicy foods right before bedtime. This can lead to digestive discomfort that can make it hard for you to sleep. Sleep habits  Keep a sleep diary to help you and your health care provider figure out what could be causing your insomnia. Write down: When you sleep. When you wake up during the night. How well you sleep. How rested you feel the next day. Any side effects of medicines you are taking. What you eat and drink. Make your bedroom a dark, comfortable place where it is easy to fall asleep. Put up shades or blackout curtains to block light from outside. Use a white noise machine to block noise. Keep the temperature cool. Limit screen use before bedtime. This includes: Watching TV. Using your smartphone, tablet, or computer. Stick to a routine that includes going to bed and waking up at the same times every day and night. This can help you fall asleep faster.  Consider making a quiet activity, such as reading, part of your nighttime routine. Try to avoid taking naps during the day so that you sleep better at night. Get out of bed if you are still awake after 15 minutes of trying to sleep. Keep the lights down, but try reading or doing a quiet activity. When you feel sleepy, go back to bed. General instructions Take over-the-counter and prescription medicines only as told by your health care provider. Exercise regularly, as told by your health care provider. Avoid exercise starting several hours before bedtime. Use relaxation techniques to manage stress. Ask your health care provider to suggest some techniques that may work well for you. These may include: Breathing exercises. Routines to release muscle tension. Visualizing peaceful scenes. Make sure that you drive carefully. Avoid driving if you feel very sleepy. Keep all follow-up visits as told by your health care provider. This is important. Contact a health care provider if: You are tired throughout the day. You have trouble in your daily routine due to sleepiness. You continue to have sleep problems, or your sleep problems get worse. Get help right away if: You have serious thoughts about hurting yourself or someone else. If you ever feel like you may hurt yourself or others, or have thoughts about taking your own life, get help right away. You can go to your nearest emergency department or call: Your local emergency services (911 in the [place] A suicide crisis helpline, such as the  National Suicide Prevention Lifeline at 808 033 6220. This is open 24 hours a day. Summary Insomnia is a sleep disorder that makes it difficult to fall asleep or stay asleep. Insomnia can be long-term (chronic) or short-term (acute). Treatment for insomnia depends on the cause. Treatment may focus on treating an underlying condition that is causing insomnia. Keep a sleep diary to help you and your health care  provider figure out what could be causing your insomnia. This information is not intended to replace advice given to you by your health care provider. Make sure you discuss any questions you have with your health care provider. Document Revised: 12/08/2019 Document Reviewed: 12/08/2019 Elsevier Patient Education  2022 [place]. Paisley Grajeda M.D.

## 2020-11-05 ENCOUNTER — Encounter: Payer: Self-pay | Admitting: Internal Medicine

## 2020-11-05 ENCOUNTER — Ambulatory Visit (INDEPENDENT_AMBULATORY_CARE_PROVIDER_SITE_OTHER): Payer: Medicare HMO | Admitting: Internal Medicine

## 2020-11-05 ENCOUNTER — Other Ambulatory Visit: Payer: Self-pay

## 2020-11-05 VITALS — BP 122/68 | HR 112 | Temp 97.7°F | Ht 64.0 in | Wt 127.8 lb

## 2020-11-05 DIAGNOSIS — E785 Hyperlipidemia, unspecified: Secondary | ICD-10-CM | POA: Diagnosis not present

## 2020-11-05 DIAGNOSIS — E1165 Type 2 diabetes mellitus with hyperglycemia: Secondary | ICD-10-CM | POA: Diagnosis not present

## 2020-11-05 DIAGNOSIS — I10 Essential (primary) hypertension: Secondary | ICD-10-CM | POA: Diagnosis not present

## 2020-11-05 DIAGNOSIS — Z79899 Other long term (current) drug therapy: Secondary | ICD-10-CM

## 2020-11-05 DIAGNOSIS — R634 Abnormal weight loss: Secondary | ICD-10-CM

## 2020-11-05 DIAGNOSIS — Z23 Encounter for immunization: Secondary | ICD-10-CM | POA: Diagnosis not present

## 2020-11-05 LAB — LIPID PANEL
Cholesterol: 204 mg/dL — ABNORMAL HIGH (ref 0–200)
HDL: 61.2 mg/dL (ref >39.00)
NonHDL: 142.35
Total CHOL/HDL Ratio: 3
Triglycerides: 315 mg/dL — ABNORMAL HIGH (ref 0.0–149.0)
VLDL: 63 mg/dL — ABNORMAL HIGH (ref 0.0–40.0)

## 2020-11-05 LAB — BASIC METABOLIC PANEL
BUN: 10 mg/dL (ref 6–23)
CO2: 25 mEq/L (ref 19–32)
Calcium: 9.3 mg/dL (ref 8.4–10.5)
Chloride: 100 mEq/L (ref 96–112)
Creatinine, Ser: 0.58 mg/dL (ref 0.40–1.20)
GFR: 92.96 mL/min (ref >60.00)
Glucose, Bld: 343 mg/dL — ABNORMAL HIGH (ref 70–99)
Potassium: 4.3 mEq/L (ref 3.5–5.1)
Sodium: 134 mEq/L — ABNORMAL LOW (ref 135–145)

## 2020-11-05 LAB — HEPATIC FUNCTION PANEL
ALT: 7 U/L (ref 0–35)
AST: 10 U/L (ref 0–37)
Albumin: 3.9 g/dL (ref 3.5–5.2)
Alkaline Phosphatase: 54 U/L (ref 39–117)
Bilirubin, Direct: 0.1 mg/dL (ref 0.0–0.3)
Total Bilirubin: 0.8 mg/dL (ref 0.2–1.2)
Total Protein: 6 g/dL (ref 6.0–8.3)

## 2020-11-05 LAB — HEMOGLOBIN A1C: Hgb A1c MFr Bld: 11 % — ABNORMAL HIGH (ref 4.6–6.5)

## 2020-11-05 LAB — LDL CHOLESTEROL, DIRECT: Direct LDL: 104 mg/dL

## 2020-11-05 MED ORDER — TRESIBA FLEXTOUCH 100 UNIT/ML ~~LOC~~ SOPN: 10.0000 [IU] | PEN_INJECTOR | Freq: Every day | SUBCUTANEOUS | 3 refills | Status: DC

## 2020-11-05 NOTE — Patient Instructions (Addendum)
I am concerned about your  weight loss being from  out of control diabetes.  We will do a new endocrine ereferral.  As discussed . I suggest we add temporally a low dose basal insulin in interim in addition to the other meds   in interm to get bg below 250 200 .  Will begin  depending o on lab but suspect we would add  10 units every 24 hours  of basal  24 insulin to avoid peaks and valleys.  Stay hydrated  in interim.  Sleep issues   may consider  other intervention   after bg  on the way to improvement .  Plan  FU with me depending on labs   plan   or 2 months  about the sleep.    Insomnia Insomnia is a sleep disorder that makes it difficult to fall asleep or stay asleep. Insomnia can cause fatigue, low energy, difficulty concentrating, mood swings, and poor performance at work or school. There are three different ways to classify insomnia: Difficulty falling asleep. Difficulty staying asleep. Waking up too early in the morning. Any type of insomnia can be long-term (chronic) or short-term (acute). Both are common. Short-term insomnia usually lasts for three months or less. Chronic insomnia occurs at least three times a week for longer than three months. What are the causes? Insomnia may be caused by another condition, situation, or substance, such as: Anxiety. Certain medicines. Gastroesophageal reflux disease (GERD) or other gastrointestinal conditions. Asthma or other breathing conditions. Restless legs syndrome, sleep apnea, or other sleep disorders. Chronic pain. Menopause. Stroke. Abuse of alcohol, tobacco, or illegal drugs. Mental health conditions, such as depression. Caffeine. Neurological disorders, such as Alzheimer's disease. An overactive thyroid (hyperthyroidism). Sometimes, the cause of insomnia may not be known. What increases the risk? Risk factors for insomnia include: Gender. Women are affected more often than men. Age. Insomnia is more common as you get  older. Stress. Lack of exercise. Irregular work schedule or working night shifts. Traveling between different time zones. Certain medical and mental health conditions. What are the signs or symptoms? If you have insomnia, the main symptom is having trouble falling asleep or having trouble staying asleep. This may lead to other symptoms, such as: Feeling fatigued or having low energy. Feeling nervous about going to sleep. Not feeling rested in the morning. Having trouble concentrating. Feeling irritable, anxious, or depressed. How is this diagnosed? This condition may be diagnosed based on: Your symptoms and medical history. Your health care provider may ask about: Your sleep habits. Any medical conditions you have. Your mental health. A physical exam. How is this treated? Treatment for insomnia depends on the cause. Treatment may focus on treating an underlying condition that is causing insomnia. Treatment may also include: Medicines to help you sleep. Counseling or therapy. Lifestyle adjustments to help you sleep better. Follow these instructions at home: Eating and drinking  Limit or avoid alcohol, caffeinated beverages, and cigarettes, especially close to bedtime. These can disrupt your sleep. Do not eat a large meal or eat spicy foods right before bedtime. This can lead to digestive discomfort that can make it hard for you to sleep. Sleep habits  Keep a sleep diary to help you and your health care provider figure out what could be causing your insomnia. Write down: When you sleep. When you wake up during the night. How well you sleep. How rested you feel the next day. Any side effects of medicines you are taking. What  you eat and drink. Make your bedroom a dark, comfortable place where it is easy to fall asleep. Put up shades or blackout curtains to block light from outside. Use a white noise machine to block noise. Keep the temperature cool. Limit screen use before  bedtime. This includes: Watching TV. Using your smartphone, tablet, or computer. Stick to a routine that includes going to bed and waking up at the same times every day and night. This can help you fall asleep faster. Consider making a quiet activity, such as reading, part of your nighttime routine. Try to avoid taking naps during the day so that you sleep better at night. Get out of bed if you are still awake after 15 minutes of trying to sleep. Keep the lights down, but try reading or doing a quiet activity. When you feel sleepy, go back to bed. General instructions Take over-the-counter and prescription medicines only as told by your health care provider. Exercise regularly, as told by your health care provider. Avoid exercise starting several hours before bedtime. Use relaxation techniques to manage stress. Ask your health care provider to suggest some techniques that may work well for you. These may include: Breathing exercises. Routines to release muscle tension. Visualizing peaceful scenes. Make sure that you drive carefully. Avoid driving if you feel very sleepy. Keep all follow-up visits as told by your health care provider. This is important. Contact a health care provider if: You are tired throughout the day. You have trouble in your daily routine due to sleepiness. You continue to have sleep problems, or your sleep problems get worse. Get help right away if: You have serious thoughts about hurting yourself or someone else. If you ever feel like you may hurt yourself or others, or have thoughts about taking your own life, get help right away. You can go to your nearest emergency department or call: Your local emergency services (911 in the [place] A suicide crisis helpline, such as the [facility] at (385)769-8071. This is open 24 hours a day. Summary Insomnia is a sleep disorder that makes it difficult to fall asleep or stay asleep. Insomnia can be  long-term (chronic) or short-term (acute). Treatment for insomnia depends on the cause. Treatment may focus on treating an underlying condition that is causing insomnia. Keep a sleep diary to help you and your health care provider figure out what could be causing your insomnia. This information is not intended to replace advice given to you by your health care provider. Make sure you discuss any questions you have with your health care provider. Document Revised: 12/08/2019 Document Reviewed: 12/08/2019 Elsevier Patient Education  2022 [employer].

## 2020-11-05 NOTE — Progress Notes (Signed)
Hemoglobin A1c still quite up at 11 blood sugar 343 kidney function liver test normal.  LDL not at goal but better than in past I have placed a referral for endocrinology again I have sent 10 Tresiba insulin FlexPen or you will take 10 units of insulin once a day every 24 hours you could choose to take it in the morning or in the evening but stick with the same time I will have the pharmacy team reach out to you for any questions about beginning this I do not think you will get low blood sugar based on how high your originally is. If you have not heard about an appointment with endocrinology in the l next 2 weeks contact us

## 2020-11-15 ENCOUNTER — Telehealth: Payer: Self-pay | Admitting: Internal Medicine

## 2020-11-15 MED ORDER — CAREFINE PEN NEEDLES 32G X 4 MM MISC: 5 refills | Status: DC

## 2020-11-15 NOTE — Telephone Encounter (Signed)
Patient is requesting for the needles for her insulin. She stated that Dr.Panosh prescribed her the pen without the needles and she cannot take her insulin without the needles.  Patient could be contacted at [phone].  Please advise.

## 2020-11-15 NOTE — Telephone Encounter (Signed)
Pt notified that older prescription for insulin needs refilled to pharmacy on file. Pt advised to notify office if additional needs; pt verb understanding.

## 2020-12-26 ENCOUNTER — Other Ambulatory Visit: Payer: Self-pay | Admitting: Internal Medicine

## 2020-12-26 ENCOUNTER — Other Ambulatory Visit: Payer: Self-pay

## 2020-12-26 MED ORDER — METFORMIN HCL 1000 MG PO TABS: ORAL_TABLET | ORAL | 0 refills | Status: DC

## 2021-01-14 ENCOUNTER — Ambulatory Visit: Payer: Medicare HMO | Admitting: Internal Medicine

## 2021-02-19 DIAGNOSIS — J029 Acute pharyngitis, unspecified: Secondary | ICD-10-CM | POA: Diagnosis not present

## 2021-02-19 DIAGNOSIS — R051 Acute cough: Secondary | ICD-10-CM | POA: Diagnosis not present

## 2021-03-12 ENCOUNTER — Telehealth: Payer: Self-pay | Admitting: Internal Medicine

## 2021-03-12 NOTE — Telephone Encounter (Signed)
Tried calling patent to schedule Medicare Annual Wellness Visit (AWV) either virtually or in office.  No answer   awvi 01/11/20 per [facility]  please schedule at anytime with [facility] Nurse Health Advisor 1 or 2   This should be a 45 minute visit.

## 2021-05-13 ENCOUNTER — Telehealth: Payer: Self-pay | Admitting: Internal Medicine

## 2021-05-13 NOTE — Telephone Encounter (Signed)
Left message for patient to call back and schedule Medicare Annual Wellness Visit (AWV) either virtually or in office. Left  my Herbie Drape number [phone] ? ? ?awvi 01/11/20 per [facility]  ? please schedule at anytime with [facility] Nurse Health Advisor 1 or 2 ? ? ?This should be a 45 minute visit.  ?

## 2021-05-14 DIAGNOSIS — Z1211 Encounter for screening for malignant neoplasm of colon: Secondary | ICD-10-CM | POA: Diagnosis not present

## 2021-05-21 LAB — COLOGUARD
COLOGUARD: NEGATIVE
Cologuard: NEGATIVE

## 2021-05-22 ENCOUNTER — Telehealth: Payer: Self-pay | Admitting: Internal Medicine

## 2021-05-22 NOTE — Telephone Encounter (Signed)
Tried calling patient to schedule Medicare Annual Wellness Visit (AWV) either virtually or in office.  ? ?No answer ? ? awvi 01/11/20 per [facility]  ? please schedule at anytime with [facility] Nurse Health Advisor 1 or 2 ? ? ?This should be a 45 minute visit.  ?

## 2021-05-23 ENCOUNTER — Encounter: Payer: Self-pay | Admitting: Internal Medicine

## 2021-06-12 ENCOUNTER — Telehealth: Payer: Self-pay | Admitting: Internal Medicine

## 2021-06-12 NOTE — Telephone Encounter (Signed)
Tried calling patient to schedule Medicare Annual Wellness Visit (AWV) either virtually or in office. ? ?No answer ? ?awvi 01/11/20 per [facility] ?please schedule at anytime with [facility] Nurse Health Advisor 1 or 2 ? ? ?This should be a 45 minute visit.  ?

## 2021-07-05 ENCOUNTER — Other Ambulatory Visit: Payer: Self-pay | Admitting: Internal Medicine

## 2021-07-05 NOTE — Telephone Encounter (Signed)
Attempted to call pt. Appt needed.

## 2021-07-08 NOTE — Telephone Encounter (Signed)
I dont  know what she is taking   if  you cant get in touch with her  asks the pharmacy  about the valsartan  and the losartan  . Send her a letter about need for  appt  and  update on her meds and condition evaluation.   It appears that the endocrinology appt  ( I referred in September)  never happened.  (Last a1c was out of control . )  Needs appt( I agree )  My last note was planning on disease management to  help also.   Please do chronic  care management referral for dm and meds ,bp  and help with meds  if she agrees .    thanks

## 2021-07-10 ENCOUNTER — Encounter: Payer: Self-pay | Admitting: Internal Medicine

## 2021-07-11 NOTE — Telephone Encounter (Signed)
ATC patient unable to leave voicemail.

## 2021-07-11 NOTE — Telephone Encounter (Signed)
Letter mailed to patient.

## 2021-07-18 ENCOUNTER — Telehealth: Payer: Self-pay | Admitting: Internal Medicine

## 2021-07-18 NOTE — Telephone Encounter (Signed)
Tried calling patient to schedule Medicare Annual Wellness Visit (AWV) either virtually or in office.  No answer   awvi 01/11/20 per [facility]  please schedule at anytime with [facility] Nurse Health Advisor 1 or 2   This should be a 45 minute visit.

## 2021-08-02 ENCOUNTER — Telehealth: Payer: Self-pay | Admitting: Internal Medicine

## 2021-08-05 MED ORDER — METFORMIN HCL 1000 MG PO TABS: ORAL_TABLET | ORAL | 0 refills | Status: DC

## 2021-08-05 NOTE — Telephone Encounter (Signed)
Rx sent to the pharmacy.

## 2021-08-27 ENCOUNTER — Other Ambulatory Visit: Payer: Self-pay

## 2021-08-27 ENCOUNTER — Encounter (HOSPITAL_COMMUNITY): Payer: Self-pay | Admitting: Oncology

## 2021-08-27 ENCOUNTER — Encounter: Payer: Self-pay | Admitting: Internal Medicine

## 2021-08-27 ENCOUNTER — Ambulatory Visit (INDEPENDENT_AMBULATORY_CARE_PROVIDER_SITE_OTHER): Payer: Medicare HMO | Admitting: Internal Medicine

## 2021-08-27 ENCOUNTER — Telehealth: Payer: Self-pay | Admitting: Internal Medicine

## 2021-08-27 ENCOUNTER — Ambulatory Visit: Payer: Medicare HMO | Admitting: Internal Medicine

## 2021-08-27 ENCOUNTER — Emergency Department (HOSPITAL_COMMUNITY)
Admission: EM | Admit: 2021-08-27 | Discharge: 2021-08-27 | Disposition: A | Discharge status: Home / Self Care | Payer: Medicare HMO | Attending: Emergency Medicine | Admitting: Emergency Medicine

## 2021-08-27 VITALS — BP 108/70 | HR 120 | Temp 98.6°F | Ht 64.0 in | Wt 107.6 lb

## 2021-08-27 DIAGNOSIS — E1165 Type 2 diabetes mellitus with hyperglycemia: Secondary | ICD-10-CM | POA: Insufficient documentation

## 2021-08-27 DIAGNOSIS — R739 Hyperglycemia, unspecified: Secondary | ICD-10-CM

## 2021-08-27 DIAGNOSIS — I10 Essential (primary) hypertension: Secondary | ICD-10-CM

## 2021-08-27 DIAGNOSIS — F419 Anxiety disorder, unspecified: Secondary | ICD-10-CM | POA: Diagnosis not present

## 2021-08-27 DIAGNOSIS — R634 Abnormal weight loss: Secondary | ICD-10-CM

## 2021-08-27 DIAGNOSIS — E785 Hyperlipidemia, unspecified: Secondary | ICD-10-CM

## 2021-08-27 DIAGNOSIS — Z79899 Other long term (current) drug therapy: Secondary | ICD-10-CM

## 2021-08-27 DIAGNOSIS — R6881 Early satiety: Secondary | ICD-10-CM

## 2021-08-27 DIAGNOSIS — Z794 Long term (current) use of insulin: Secondary | ICD-10-CM | POA: Insufficient documentation

## 2021-08-27 DIAGNOSIS — Z7984 Long term (current) use of oral hypoglycemic drugs: Secondary | ICD-10-CM | POA: Insufficient documentation

## 2021-08-27 LAB — URINALYSIS, ROUTINE W REFLEX MICROSCOPIC
Bacteria, UA: NONE SEEN
Bilirubin Urine: NEGATIVE
Glucose, UA: >=500 mg/dL — AB
Hgb urine dipstick: NEGATIVE
Ketones, ur: 5 mg/dL — AB
Leukocytes,Ua: NEGATIVE
Nitrite: NEGATIVE
Protein, ur: NEGATIVE mg/dL
Specific Gravity, Urine: 1.01 (ref 1.005–1.030)
pH: 5 (ref 5.0–8.0)

## 2021-08-27 LAB — CBC WITH DIFFERENTIAL/PLATELET
Basophils Absolute: 0.1 10*3/uL (ref 0.0–0.1)
Basophils Relative: 1.8 % (ref 0.0–3.0)
Eosinophils Absolute: 0.1 10*3/uL (ref 0.0–0.7)
Eosinophils Relative: 1.1 % (ref 0.0–5.0)
HCT: 43.8 % (ref 36.0–46.0)
Hemoglobin: 14.9 g/dL (ref 12.0–15.0)
Lymphocytes Relative: 28.1 % (ref 12.0–46.0)
Lymphs Abs: 1.6 10*3/uL (ref 0.7–4.0)
MCHC: 34 g/dL (ref 30.0–36.0)
MCV: 90.8 fl (ref 78.0–100.0)
Monocytes Absolute: 0.4 10*3/uL (ref 0.1–1.0)
Monocytes Relative: 7.8 % (ref 3.0–12.0)
Neutro Abs: 3.5 10*3/uL (ref 1.4–7.7)
Neutrophils Relative %: 61.2 % (ref 43.0–77.0)
Platelets: 344 10*3/uL (ref 150.0–400.0)
RBC: 4.83 Mil/uL (ref 3.87–5.11)
RDW: 12.9 % (ref 11.5–15.5)
WBC: 5.7 10*3/uL (ref 4.0–10.5)

## 2021-08-27 LAB — HEPATIC FUNCTION PANEL
ALT: 11 U/L (ref 0–35)
AST: 13 U/L (ref 0–37)
Albumin: 3.9 g/dL (ref 3.5–5.2)
Alkaline Phosphatase: 56 U/L (ref 39–117)
Bilirubin, Direct: 0.1 mg/dL (ref 0.0–0.3)
Total Bilirubin: 0.6 mg/dL (ref 0.2–1.2)
Total Protein: 6.1 g/dL (ref 6.0–8.3)

## 2021-08-27 LAB — LIPID PANEL
Cholesterol: 211 mg/dL — ABNORMAL HIGH (ref 0–200)
HDL: 72.3 mg/dL (ref >39.00)
NonHDL: 138.63
Total CHOL/HDL Ratio: 3
Triglycerides: 277 mg/dL — ABNORMAL HIGH (ref 0.0–149.0)
VLDL: 55.4 mg/dL — ABNORMAL HIGH (ref 0.0–40.0)

## 2021-08-27 LAB — BASIC METABOLIC PANEL
Anion gap: 8 (ref 5–15)
BUN: 12 mg/dL (ref 6–23)
BUN: 12 mg/dL (ref 8–23)
CO2: 26 mEq/L (ref 19–32)
CO2: 22 mmol/L (ref 22–32)
Calcium: 9.1 mg/dL (ref 8.4–10.5)
Calcium: 9.1 mg/dL (ref 8.9–10.3)
Chloride: 99 mEq/L (ref 96–112)
Chloride: 104 mmol/L (ref 98–111)
Creatinine, Ser: 0.57 mg/dL (ref 0.40–1.20)
Creatinine, Ser: 0.55 mg/dL (ref 0.44–1.00)
GFR, Estimated: >60 mL/min (ref >60)
GFR: 92.82 mL/min (ref >60.00)
Glucose, Bld: 577 mg/dL (ref 70–99)
Glucose, Bld: 337 mg/dL — ABNORMAL HIGH (ref 70–99)
Potassium: 4.5 mEq/L (ref 3.5–5.1)
Potassium: 4.1 mmol/L (ref 3.5–5.1)
Sodium: 131 mEq/L — ABNORMAL LOW (ref 135–145)
Sodium: 134 mmol/L — ABNORMAL LOW (ref 135–145)

## 2021-08-27 LAB — CBC
HCT: 44.2 % (ref 36.0–46.0)
Hemoglobin: 15.7 g/dL — ABNORMAL HIGH (ref 12.0–15.0)
MCH: 31.2 pg (ref 26.0–34.0)
MCHC: 35.5 g/dL (ref 30.0–36.0)
MCV: 87.9 fL (ref 80.0–100.0)
Platelets: 328 10*3/uL (ref 150–400)
RBC: 5.03 MIL/uL (ref 3.87–5.11)
RDW: 12.4 % (ref 11.5–15.5)
WBC: 6 10*3/uL (ref 4.0–10.5)
nRBC: 0 % (ref 0.0–0.2)

## 2021-08-27 LAB — POCT GLYCOSYLATED HEMOGLOBIN (HGB A1C): Hemoglobin A1C: 11.1 % — AB (ref 4.0–5.6)

## 2021-08-27 LAB — BLOOD GAS, VENOUS
Acid-Base Excess: 1.7 mmol/L (ref 0.0–2.0)
Bicarbonate: 27.2 mmol/L (ref 20.0–28.0)
O2 Saturation: 38.6 %
Patient temperature: 37
pCO2, Ven: 45 mmHg (ref 44–60)
pH, Ven: 7.39 (ref 7.25–7.43)
pO2, Ven: <31 mmHg — CL (ref 32–45)

## 2021-08-27 LAB — T4, FREE: Free T4: 1.07 ng/dL (ref 0.60–1.60)

## 2021-08-27 LAB — C-REACTIVE PROTEIN: CRP: <1 mg/dL (ref 0.5–20.0)

## 2021-08-27 LAB — VITAMIN B12: Vitamin B-12: 245 pg/mL (ref 211–911)

## 2021-08-27 LAB — TSH: TSH: 1.58 u[IU]/mL (ref 0.35–5.50)

## 2021-08-27 LAB — LDL CHOLESTEROL, DIRECT: Direct LDL: 105 mg/dL

## 2021-08-27 LAB — CBG MONITORING, ED
Glucose-Capillary: 342 mg/dL — ABNORMAL HIGH (ref 70–99)
Glucose-Capillary: 245 mg/dL — ABNORMAL HIGH (ref 70–99)
Glucose-Capillary: 194 mg/dL — ABNORMAL HIGH (ref 70–99)

## 2021-08-27 LAB — VITAMIN D 25 HYDROXY (VIT D DEFICIENCY, FRACTURES): VITD: 13.16 ng/mL — ABNORMAL LOW (ref 30.00–100.00)

## 2021-08-27 LAB — SEDIMENTATION RATE: Sed Rate: 3 mm/hr (ref 0–30)

## 2021-08-27 MED ORDER — INSULIN ASPART 100 UNIT/ML IJ SOLN
3.0000 [IU] | Freq: Once | INTRAMUSCULAR | Status: AC
  Administered 2021-08-27: 3 [IU] via SUBCUTANEOUS
  Filled 2021-08-27: qty 0.03

## 2021-08-27 MED ORDER — SODIUM CHLORIDE 0.9 % IV BOLUS
1000.0000 mL | Freq: Once | INTRAVENOUS | Status: AC
  Administered 2021-08-27: 1000 mL via INTRAVENOUS

## 2021-08-27 NOTE — Chronic Care Management (AMB) (Signed)
  Care Management  Note   08/27/2021 Name: Ritta Hammes MRN: [id number] DOB: [birth year]  Favour Aleshire is a 71 y.o. year old female who is a primary care patient of Panosh, Standley Brooking, MD. The care management team was consulted for assistance with chronic disease management and care coordination needs.   Ms. Even was given information about Care Management services today including:  CCM service includes personalized support from designated clinical staff supervised by the physician, including individualized plan of care and coordination with other care providers 24/7 contact phone numbers for assistance for urgent and routine care needs. Service will only be billed when office clinical staff spend 20 minutes or more in a month to coordinate care. Only one practitioner may furnish and bill the service in a calendar month. The patient may stop CCM services at amy time (effective at the end of the month) by phone call to the office staff. The patient will be responsible for cost sharing (co-pay) or up to 20% of the service fee (after annual deductible is met)  Patient agreed to services and verbal consent obtained.  Follow up plan:   Face to Face appointment with care management team member scheduled for: 09/18/21 $RemoveBef'@2pm'[id number]$   [place]

## 2021-08-27 NOTE — ED Provider Triage Note (Signed)
Emergency Medicine Provider Triage Evaluation Note  [name] [name] , a 71 y.o. female  was evaluated in triage.  Pt complains of glycemia.  Was seen at PCP office today.  She has known type 2 diabetes.  She is taking her metformin however does not like the Trulicity she was prescribed due to it giving her vomiting and diarrhea.  She was supposed to be taking insulin however she was afraid and has not started this.  Noted to be hyperglycemic without any acidosis at her PCP office.  She was sent here for IV fluids and insulin.  Patient states she has chronic early satiety which has been present over the last year.  She has also noted some weight loss.  No abdominal pain, cough  Review of Systems  Positive: Hyperglycemia Negative:   Physical Exam  BP (!) 164/98 (BP Location: Left Arm)   Pulse (!) 119 Comment: Pt states she is nervous so her pulse will be high  Temp 98.4 F (36.9 C) (Oral)   Resp 20   SpO2 100%  Gen:   Awake, no distress   Resp:  Normal effort  MSK:   Moves extremities without difficulty  Other:    Medical Decision Making  Medically screening exam initiated at 5:05 PM.  Appropriate orders placed.  [name] [name] was informed that the remainder of the evaluation will be completed by another provider, this initial triage assessment does not replace that evaluation, and the importance of remaining in the ED until their evaluation is complete.  Hyperglycemia    Marykay Mccleod A, PA-C 08/27/21 1708

## 2021-08-27 NOTE — Discharge Instructions (Addendum)
Follow up with your primary care provider.

## 2021-08-27 NOTE — ED Provider Notes (Signed)
[facility] DEPT Provider Note   CSN: [id number] Arrival date & time: 08/27/21  1644    History  Chief Complaint  Patient presents with   Hyperglycemia    [name] [name] is a 71 y.o. female with history of vitamin D deficiency, diabetes, non-insulin-dependent, IBS here for evaluation of hyperglycemia.  Was following up with her PCP today.  Had followed up 1 year prior.  Her blood sugars have been running from 250-400 and her PCP.  She is taking her metformin however not compliant with her Trulicity because it gives her diarrhea.  She had not started the insulin that she was prescribed because she was concerned this would drop her blood sugar too low.  She does note she has chronic vision changes which has been present over the last year.  States she gets chronic early satiety without vomiting.  No fever, recent emesis, abdominal pain, chest pain, shortness of breath, night sweats.  No changes in bowel movements.  Was noted at her PCP office have some hyperglycemia, sent here for IV fluids and insulin.  Did not have any evidence of DKA on her prior labs.  HPI     Home Medications Prior to Admission medications   Medication Sig Start Date End Date Taking? Authorizing Provider  Accu-Chek Softclix Lancets lancets Use as instructed 06/21/20   Panosh, Standley Brooking, MD  atorvastatin (LIPITOR) 40 MG tablet TAKE 1 TABLET BY MOUTH EVERY DAY 08/24/20   Panosh, Standley Brooking, MD  Blood Glucose Monitoring Suppl (ACCU-CHEK GUIDE) w/Device KIT Daily use 06/21/20   Panosh, Standley Brooking, MD  Dulaglutide (TRULICITY) 6.76 HM/0.9OB SOPN Inject 0.75 mg into the skin once a week. 06/19/20   Panosh, Standley Brooking, MD  FREESTYLE UNISTICK II LANCETS MISC Use to check blood sugar 1-2 times daily 07/23/17   Philemon Kingdom, MD  glucose blood (ACCU-CHEK GUIDE) test strip Use 1-2 times daily 06/21/20   Panosh, Standley Brooking, MD  glucose blood (FREESTYLE LITE) test strip Use as instructed 06/18/20   Panosh, Standley Brooking, MD   Glucose Blood (FREESTYLE TEST VI) by In Vitro route.    [provider]  glucose blood test strip Use to check blood sugar 1-2 times daily 10/06/13   Panosh, Standley Brooking, MD  insulin degludec (TRESIBA FLEXTOUCH) 100 UNIT/ML FlexTouch Pen Inject 10 Units into the skin daily. Patient not taking: Reported on 08/27/2021 11/05/20   Panosh, Standley Brooking, MD  Insulin Pen Needle (CAREFINE PEN NEEDLES) 32G X 4 MM MISC Use to inject insulin daily 11/15/20   Panosh, Standley Brooking, MD  losartan (COZAAR) 50 MG tablet Take 1 tablet (50 mg total) by mouth daily. 06/21/20   Panosh, Standley Brooking, MD  metFORMIN (GLUCOPHAGE) 1000 MG tablet TAKE 1 TABLET (1000 MG TOTAL) BY MOUTH 2 (TWO) TIMES DAILY WITH A MEAL. 08/05/21   Panosh, Standley Brooking, MD      Allergies    Naproxen and Simvastatin    Review of Systems   Review of Systems  Constitutional: Negative.   HENT: Negative.    Respiratory: Negative.    Cardiovascular: Negative.   Gastrointestinal: Negative.   All other systems reviewed and are negative.  Physical Exam Updated Vital Signs BP (!) 150/86   Pulse 96   Temp 98.2 F (36.8 C) (Oral)   Resp 16   SpO2 99%  Physical Exam Vitals and nursing note reviewed.  Constitutional:      General: She is not in acute distress.    Appearance: She  is well-developed. She is not ill-appearing, toxic-appearing or diaphoretic.  HENT:     Head: Atraumatic.     Nose: Nose normal.     Mouth/Throat:     Mouth: Mucous membranes are moist.  Eyes:     Pupils: Pupils are equal, round, and reactive to light.  Cardiovascular:     Rate and Rhythm: Normal rate.  Pulmonary:     Effort: Pulmonary effort is normal. No respiratory distress.     Breath sounds: Normal breath sounds.  Abdominal:     General: Bowel sounds are normal. There is no distension.     Palpations: Abdomen is soft.  Musculoskeletal:        General: Normal range of motion.     Cervical back: Normal range of motion.  Skin:    General: Skin is warm and dry.      Capillary Refill: Capillary refill takes less than 2 seconds.  Neurological:     General: No focal deficit present.     Mental Status: She is alert and oriented to person, place, and time.  Psychiatric:        Mood and Affect: Mood normal.     ED Results / Procedures / Treatments   Labs (all labs ordered are listed, but only abnormal results are displayed) Labs Reviewed  BASIC METABOLIC PANEL - Abnormal; Notable for the following components:      Result Value   Sodium 134 (*)    Glucose, Bld 337 (*)    All other components within normal limits  CBC - Abnormal; Notable for the following components:   Hemoglobin 15.7 (*)    All other components within normal limits  URINALYSIS, ROUTINE W REFLEX MICROSCOPIC - Abnormal; Notable for the following components:   Color, Urine STRAW (*)    Glucose, UA >=500 (*)    Ketones, ur 5 (*)    All other components within normal limits  BLOOD GAS, VENOUS - Abnormal; Notable for the following components:   pO2, Ven <31 (*)    All other components within normal limits  CBG MONITORING, ED - Abnormal; Notable for the following components:   Glucose-Capillary 342 (*)    All other components within normal limits  CBG MONITORING, ED - Abnormal; Notable for the following components:   Glucose-Capillary 245 (*)    All other components within normal limits  CBG MONITORING, ED - Abnormal; Notable for the following components:   Glucose-Capillary 194 (*)    All other components within normal limits  CBG MONITORING, ED    EKG None  Radiology No results found.  Procedures Procedures    Medications Ordered in ED Medications  sodium chloride 0.9 % bolus 1,000 mL (0 mLs Intravenous Stopped 08/27/21 2007)  insulin aspart (novoLOG) injection 3 Units (3 Units Subcutaneous Given 08/27/21 1913)   ED Course/ Medical Decision Making/ A&P    71 year old history of diabetes, IBS, HTN here for evaluation of hyperglycemia.  Followed with PCP today which  she had not seen in about a year.  Noncompliant with medications as she does not like the way they make her feel.  Blood sugars in the 200s/500s at home.  Has some chronic satiety at baseline.  Was sent here due to elevated blood sugars.  Labs and imaging personally viewed and interpreted:  CBC without leukocytosis Metabolic panel with hyperglycemia UA negative for infection VBG without acidosis, normal bicarb  Patient reassessed.  Was given small dose of insulin as well as IV fluids.  Blood sugars trending down.  Discussed results with patient, family in room.  I encouraged her to start the medications which were prescribed to her by her PCP.  She has no evidence of DKA, HHS.  Encouraged return for new or worsening symptoms  The patient has been appropriately medically screened and/or stabilized in the ED. I have low suspicion for any other emergent medical condition which would require further screening, evaluation or treatment in the ED or require inpatient management.  Patient is hemodynamically stable and in no acute distress.  Patient able to ambulate in department prior to ED.  Evaluation does not show acute pathology that would require ongoing or additional emergent interventions while in the emergency department or further inpatient treatment.  I have discussed the diagnosis with the patient and answered all questions.  Pain is been managed while in the emergency department and patient has no further complaints prior to discharge.  Patient is comfortable with plan discussed in room and is stable for discharge at this time.  I have discussed strict return precautions for returning to the emergency department.  Patient was encouraged to follow-up with PCP/specialist refer to at discharge.                            Medical Decision Making Amount and/or Complexity of Data Reviewed Independent Historian: friend External Data Reviewed: labs and notes. Labs: ordered. Decision-making details  documented in ED Course.  Risk OTC drugs. Prescription drug management. Drug therapy requiring intensive monitoring for toxicity. Diagnosis or treatment significantly limited by social determinants of health.         Final Clinical Impression(s) / ED Diagnoses Final diagnoses:  Hyperglycemia    Rx / DC Orders ED Discharge Orders     None         Faige Seely A, PA-C 08/27/21 2233    Regan Lemming, MD 08/27/21 2303

## 2021-08-27 NOTE — ED Triage Notes (Signed)
Pt reports to ED per her PCP d/t elevated glucose.  Per pt her doctor called her and urged pt to present.  Pt denies any sx states she feels normal.

## 2021-08-27 NOTE — Progress Notes (Signed)
Blood sugar is dangerously high  577. I advise she  go to ed for iv fluids and insulin . ALthough it is good that kidney function is ok . And no obvious acid in bloodstream  .  she should receive   care  ,insulin today .Marland Kitchen And not wait.

## 2021-08-27 NOTE — Patient Instructions (Signed)
I agree  need evaluation for weight loss and diabetes control.  Will have  our chronic care management team reach out to you   Help with glucose monitoring  and insulin help.  I will order a ct scan of abdomen .  And we may ask GI team to see you also .   Can hold the losartan if bp low and  or light headed   Stay  hydrated.

## 2021-08-27 NOTE — Progress Notes (Signed)
Chief Complaint  Patient presents with   Follow-up   Weight Loss   Diabetes    HPI: [name] [name] 71 y.o. come in for above. Her last visit was in September 2022 and felt to have follow-up as planned. Since her last visit her blood sugars has been anywhere from 2 50-400 she is taking the metformin and occasionally the Trulicity but causes her stomachache and diarrhea so avoids it She has not taken the insulin because she is fearful of it and she is home alone of what it would do to her blood sugar.  In the meantime her vision has decreased with supposed to have cataract surgery before the pandemic she does not drive because of her vision and her grandson drives her to appointments. She is lost a large amount of weight and is concerned about this states that she gets early satiety without vomiting and sometimes stomach ache and upper abdomen a lot of gas.  No blood. Denies any respiratory symptoms or syncope but does sometimes have lightheadedness when she goes from bending over to standing.  Does not check blood pressure does not have a monitor right now but is on losartan 50 mg.  She is taking atorvastatin on a regular basis reported.  Denies side effects.   ROS: See pertinent positives and negatives per HPI.  Past Medical History:  Diagnosis Date   Allergic rhinitis    Diabetes mellitus    Hyperlipidemia    Hypertension    IBS (irritable bowel syndrome)    Normal nuclear stress test 1 /08   low risk study    Sciatica     Family History  Problem Relation Age of Onset   Acute myelogenous leukemia Mother        died when pat age 2    Diabetes Father    Diabetes Paternal Grandfather    Breast cancer Neg Hx     Social History   Socioeconomic History   Marital status: Legally Separated    Spouse name: Not on file   Number of children: Not on file   Years of education: Not on file   Highest education level: Not on file  Occupational History   Not on file   Tobacco Use   Smoking status: Never   Smokeless tobacco: Never  Substance and Sexual Activity   Alcohol use: No   Drug use: No   Sexual activity: Not on file  Other Topics Concern   Not on file  Social History Narrative   Separated single mom   HH of 4   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          Social Determinants of Health   Financial Resource Strain: Not on file  Food Insecurity: Not on file  Transportation Needs: Not on file  Physical Activity: Not on file  Stress: Not on file  Social Connections: Not on file    Outpatient Medications Prior to Visit  Medication Sig Dispense Refill   Accu-Chek Softclix Lancets lancets Use as instructed 100 each 6   atorvastatin (LIPITOR) 40 MG tablet TAKE 1 TABLET BY MOUTH EVERY DAY 90 tablet 1   Blood Glucose Monitoring Suppl (ACCU-CHEK GUIDE) w/Device KIT Daily use  1 kit 0   Dulaglutide (TRULICITY) 3.55 DD/2.2GU SOPN Inject 0.75 mg into the skin once a week. 0.5 mL 5   FREESTYLE UNISTICK II LANCETS MISC Use to check blood sugar 1-2 times daily 100 each 3   glucose blood (ACCU-CHEK GUIDE) test strip Use 1-2 times daily 100 each 6   glucose blood (FREESTYLE LITE) test strip Use as instructed 100 each 0   Glucose Blood (FREESTYLE TEST VI) by In Vitro route.     glucose blood test strip Use to check blood sugar 1-2 times daily 100 each 3   Insulin Pen Needle (CAREFINE PEN NEEDLES) 32G X 4 MM MISC Use to inject insulin daily 100 each 5   losartan (COZAAR) 50 MG tablet Take 1 tablet (50 mg total) by mouth daily. 90 tablet 1   metFORMIN (GLUCOPHAGE) 1000 MG tablet TAKE 1 TABLET (1000 MG TOTAL) BY MOUTH 2 (TWO) TIMES DAILY WITH A MEAL. 180 tablet 0   glimepiride (AMARYL) 4 MG tablet TAKE 1 TABLET (4 MG TOTAL) BY MOUTH 2 (TWO) TIMES DAILY BEFORE A MEAL. 180  tablet 0   losartan (COZAAR) 50 MG tablet TAKE 1 TABLET BY MOUTH EVERY DAY 90 tablet 0   valsartan (DIOVAN) 160 MG tablet Take 1 tablet (160 mg total) by mouth daily. 30 tablet 3   insulin degludec (TRESIBA FLEXTOUCH) 100 UNIT/ML FlexTouch Pen Inject 10 Units into the skin daily. (Patient not taking: Reported on 08/27/2021) 3 mL 3   pioglitazone (ACTOS) 15 MG tablet TAKE 1 TABLET BY MOUTH EVERY DAY (Patient not taking: Reported on 08/27/2021) 90 tablet 0   No facility-administered medications prior to visit.     EXAM:  BP 108/70 (BP Location: Left Arm, Patient Position: Sitting, Cuff Size: Normal)   Pulse (!) 120   Temp 98.6 F (37 C) (Oral)   Ht _0  (1.626 m)   Wt 107 lb 9.6 oz (48.8 kg)   SpO2 98%   BMI 18.47 kg/m   Body mass index is 18.47 kg/m. Wt Readings from Last 3 Encounters:  08/27/21 107 lb 9.6 oz (48.8 kg)  11/05/20 127 lb 12.8 oz (58 kg)  06/18/20 142 lb (64.4 kg)   Before pandemic was 169 GENERAL: vitals reviewed and listed above, alert, oriented, appears well hydrated and in no acute distressweight loss parent decreased muscle mass HEENT: atraumatic, conjunctiva  clear, no obvious abnormalities on inspection of external nose and ears OP : no lesion edema or exudate  NECK: no obvious masses on inspection palpation  LUNGS: clear to auscultation bilaterally, no wheezes, rales or rhonchi, good air movement CV: HRRR, no clubbing cyanosis or  peripheral edema nl cap refill  Abdomen soft without again a megaly guarding or rebound Lymph nodes no cervical axillary adenopathy. MS: moves all extremities without noticeable focal  abnormality feet without ulcers cracking or lesions pulses present sensation is grossly intact. PSYCH: pleasant and cooperative, no obvious depression or anxiety Lab Results  Component Value Date   WBC 6.2 06/20/2020   HGB 15.8 (H) 06/20/2020   HCT 45.0 06/20/2020   PLT 321.0 06/20/2020   GLUCOSE 343 (H) 11/05/2020   CHOL 204 (H) 11/05/2020    TRIG 315.0 (H) 11/05/2020   HDL 61.20 11/05/2020   LDLDIRECT 104.0 11/05/2020   LDLCALC 73 10/09/2011   ALT 7 11/05/2020   AST 10 11/05/2020   NA 134 (L) 11/05/2020   K 4.3 11/05/2020   CL 100 11/05/2020   CREATININE 0.58 11/05/2020   BUN 10  11/05/2020   CO2 25 11/05/2020   TSH 1.61 06/20/2020   HGBA1C 11.1 (A) 08/27/2021   MICROALBUR <0.7 06/20/2020   BP Readings from Last 3 Encounters:  08/27/21 108/70  11/05/20 122/68  06/18/20 132/70    ASSESSMENT AND PLAN:  Discussed the following assessment and plan:  Type 2 diabetes mellitus with hyperglycemia, without long-term current use of insulin ([place]) - Plan: POC HgB N0U, Basic metabolic panel, CBC with Differential/Platelet, Hepatic function panel, Lipid panel, TSH, T4, free, Sedimentation rate, Vitamin B12, C-reactive protein, VITAMIN D 25 Hydroxy (Vit-D Deficiency, Fractures), VITAMIN D 25 Hydroxy (Vit-D Deficiency, Fractures), C-reactive protein, Vitamin B12, Sedimentation rate, T4, free, TSH, Lipid panel, Hepatic function panel, CBC with Differential/Platelet, Basic metabolic panel  Weight loss - dramatic  over past year concern about other process besides out of control DM - Plan: Basic metabolic panel, CBC with Differential/Platelet, Hepatic function panel, Lipid panel, TSH, T4, free, Sedimentation rate, Vitamin B12, C-reactive protein, VITAMIN D 25 Hydroxy (Vit-D Deficiency, Fractures), VITAMIN D 25 Hydroxy (Vit-D Deficiency, Fractures), C-reactive protein, Vitamin B12, Sedimentation rate, T4, free, TSH, Lipid panel, Hepatic function panel, CBC with Differential/Platelet, Basic metabolic panel  Early satiety - Plan: Basic metabolic panel, CBC with Differential/Platelet, Hepatic function panel, Lipid panel, TSH, T4, free, Sedimentation rate, Vitamin B12, C-reactive protein, VITAMIN D 25 Hydroxy (Vit-D Deficiency, Fractures), VITAMIN D 25 Hydroxy (Vit-D Deficiency, Fractures), C-reactive protein, Vitamin B12, Sedimentation rate, T4,  free, TSH, Lipid panel, Hepatic function panel, CBC with Differential/Platelet, Basic metabolic panel  Essential hypertension - Plan: Basic metabolic panel, CBC with Differential/Platelet, Hepatic function panel, Lipid panel, TSH, T4, free, Sedimentation rate, Vitamin B12, C-reactive protein, VITAMIN D 25 Hydroxy (Vit-D Deficiency, Fractures), VITAMIN D 25 Hydroxy (Vit-D Deficiency, Fractures), C-reactive protein, Vitamin B12, Sedimentation rate, T4, free, TSH, Lipid panel, Hepatic function panel, CBC with Differential/Platelet, Basic metabolic panel  Hyperlipidemia, unspecified hyperlipidemia type - Plan: Basic metabolic panel, CBC with Differential/Platelet, Hepatic function panel, Lipid panel, TSH, T4, free, Sedimentation rate, Vitamin B12, C-reactive protein, VITAMIN D 25 Hydroxy (Vit-D Deficiency, Fractures), VITAMIN D 25 Hydroxy (Vit-D Deficiency, Fractures), C-reactive protein, Vitamin B12, Sedimentation rate, T4, free, TSH, Lipid panel, Hepatic function panel, CBC with Differential/Platelet, Basic metabolic panel  Anxiety about Insulin - about insulin  medications Diabetes out-of-control for a number of years fear of insulin multiple referrals have not panned out and lack of regular follow ups. She has had dramatic weight loss and looks wasted for her : concern about other disease process occurring. She reports she is going on vacation to visit family next week but "will start her insulin after that." Discussed with her that she is except to receive her chronic care management particularly pharmacy help.  Vision did is decreased and her grandson who is going to [facility] will be driving her to appointments. Some type of continuous monitor may really help her allay her fears of insulin. In the meantime we will advise his abdominal scan and CT if needs premedication will do Avoid metformin when she is dehydrated. Can hold the losartan if lightheaded dizziness assuming her blood pressure is lower  because she has lost some much weight. -Patient advised to return or notify health care team  if  new concerns arise. 55 minutes  review face to face and intro to ccm Patient Instructions  I agree  need evaluation for weight loss and diabetes control.  Will have  our chronic care management team reach out to you   Help with glucose monitoring  and insulin help.  I will order a ct scan of abdomen .  And we may ask GI team to see you also .   Can hold the losartan if bp low and  or light headed   Stay  hydrated.    Standley Brooking. Yunis Voorheis M.D. Review of record  had pulm nodules in past  will review .  Not high risk  IMPRESSION: There is asymmetric synovial thickening involving the clavicle heads, right side greater the left. Findings are most compatible with degenerative changes at the sternoclavicular joints. There is no evidence for lymphadenopathy. No evidence for a soft tissue mass.   No acute abnormality in the chest.   There is a punctate nodule near the left major fissure which is indeterminate. If the patient is at high risk for bronchogenic carcinoma, follow-up chest CT at 1 year is recommended. If the patient is at low risk, no follow-up is needed. This recommendation follows the consensus statement: Guidelines for Management of Small Pulmonary Nodules Detected on CT Scans: A Statement from the [employer] as published in Radiology 2005; 237:395-400.     Electronically Signed   By: Markus Daft M.D.   On: 08/08/2014 10:43

## 2021-08-28 ENCOUNTER — Telehealth: Payer: Self-pay | Admitting: *Deleted

## 2021-08-28 NOTE — Progress Notes (Signed)
Vit d is very low   Send in vit d 50000 iu disp 12 take one per week  will follow up at next visits

## 2021-08-28 NOTE — Chronic Care Management (AMB) (Signed)
  Chronic Care Management   Outreach Note  08/28/2021 Name: [name] [name] MRN: [id number] DOB: [birth year]  [name] is a 71 y.o. year old female who is a primary care patient of Panosh, Standley Brooking, MD. I reached out to [name] by phone today in response to a referral sent by [name] primary care provider.  An unsuccessful telephone outreach was attempted today. The patient was referred to the case management team for assistance with care management and care coordination.   Follow Up Plan: A HIPAA compliant phone message was left for the patient providing contact information and requesting a return call.  The care management team will reach out to the patient again over the next 7 days.  If patient returns call to provider office, please advise to call [place]* at [phone].*  [place]  Direct Dial: [phone]

## 2021-08-29 ENCOUNTER — Other Ambulatory Visit: Payer: Self-pay

## 2021-08-29 MED ORDER — VITAMIN D 125 MCG (5000 UT) PO CAPS: 5000.0000 [IU] | ORAL_CAPSULE | ORAL | 0 refills | Status: DC

## 2021-08-30 NOTE — Chronic Care Management (AMB) (Signed)
  Chronic Care Management   Note  08/30/2021 Name: Jenisha Faison MRN: [id number] DOB: 12-18-1950  Caidance Sybert is a 71 y.o. year old female who is a primary care patient of Panosh, Standley Brooking, MD. I reached out to [name] by phone today in response to a referral sent by [name] PCP.  Ms. Linville was given information about Chronic Care Management services today including:  CCM service includes personalized support from designated clinical staff supervised by her physician, including individualized plan of care and coordination with other care providers 24/7 contact phone numbers for assistance for urgent and routine care needs. Service will only be billed when office clinical staff spend 20 minutes or more in a month to coordinate care. Only one practitioner may furnish and bill the service in a calendar month. The patient may stop CCM services at any time (effective at the end of the month) by phone call to the office staff. The patient is responsible for co-pay (up to 20% after annual deductible is met) if co-pay is required by the individual health plan.   Patient did not agree to speak with RNCM with  care management services and does not wish to consider at this time.Will continue to work Pharmacy.   Follow up plan: The patient has been provided with contact information for the care management team and has been advised to call with any health related questions or concerns.   [place]  Direct Dial: [phone]

## 2021-09-03 MED ORDER — VITAMIN D 125 MCG (5000 UT) PO CAPS: 50000.0000 [IU] | ORAL_CAPSULE | ORAL | 0 refills | Status: DC

## 2021-09-03 NOTE — Addendum Note (Signed)
Addended by: Geradine Girt D on: 09/03/2021 10:58 AM   Modules accepted: Orders

## 2021-09-16 ENCOUNTER — Telehealth: Payer: Self-pay | Admitting: Pharmacist

## 2021-09-16 NOTE — Chronic Care Management (AMB) (Signed)
Chronic Care Management Pharmacy Assistant   Name: [name] [name]  MRN: [id number] DOB: 01/21/1951  [name] [name] is an 71 y.o. year old female who presents for her initial CCM visit with the clinical pharmacist.  Reason for Encounter: Chart prep for initial visit with Jeni Salles Clinical Pharmacist on 09/18/2021 at 2:00 in office.    Conditions to be addressed/monitored: HTN, HLD, and DMII, sciatica, obesity, vit D deficiency, IBS and allergic rhinitis  Recent office visits:  08/27/2021 [name] MD - Patient was seen for Type 2 diabetes mellitus with hyperglycemia, without long-term current use of insulin and additional issues. Discontinued Glimepiride, Pioglitazone and Valsartan.  Recent consult visits:  None  Hospital visits:  Patient was seen at [facility] ED on 08/27/2021 (5 hours) due to Hyperglycemia.    New?Medications Started at [facility] Discharge:?? No medications started Medication Changes at Hospital Discharge: No medications changed Medications Discontinued at Hospital Discharge: No medications discontinued Medications that remain the same after Hospital Discharge:??  -All other medications will remain the same.    Medications: Outpatient Encounter Medications as of 09/16/2021  Medication Sig Note   Accu-Chek Softclix Lancets lancets Use as instructed    atorvastatin (LIPITOR) 40 MG tablet TAKE 1 TABLET BY MOUTH EVERY DAY    Blood Glucose Monitoring Suppl (ACCU-CHEK GUIDE) w/Device KIT Daily use    Cholecalciferol (VITAMIN D) 125 MCG (5000 UT) CAPS Take 50,000 Units by mouth once a week.    Dulaglutide (TRULICITY) 8.91 QX/4.5WT SOPN Inject 0.75 mg into the skin once a week. 08/27/2021: Taking infrequently   FREESTYLE UNISTICK II LANCETS MISC Use to check blood sugar 1-2 times daily    glucose blood (ACCU-CHEK GUIDE) test strip Use 1-2 times daily    glucose blood (FREESTYLE LITE) test strip Use as instructed    Glucose Blood  (FREESTYLE TEST VI) by In Vitro route.    glucose blood test strip Use to check blood sugar 1-2 times daily    insulin degludec (TRESIBA FLEXTOUCH) 100 UNIT/ML FlexTouch Pen Inject 10 Units into the skin daily. (Patient not taking: Reported on 08/27/2021)    Insulin Pen Needle (CAREFINE PEN NEEDLES) 32G X 4 MM MISC Use to inject insulin daily    losartan (COZAAR) 50 MG tablet Take 1 tablet (50 mg total) by mouth daily.    metFORMIN (GLUCOPHAGE) 1000 MG tablet TAKE 1 TABLET (1000 MG TOTAL) BY MOUTH 2 (TWO) TIMES DAILY WITH A MEAL.    No facility-administered encounter medications on file as of 09/16/2021.  Fill History: atorvastatin (LIPITOR) tablet 88/82/8003 90   TRULICITY 1.5 KJ/1.7 ML PEN 04/17/2021 28   LOSARTAN POTASSIUM 50 MG TAB 04/17/2021 90   METFORMIN HCL 1,000 MG TABLET 08/05/2021 90   Have you seen any other providers since your last visit? **Patient denies  Any changes in your medications or health? Patient denies  Any side effects from any medications? Patient denies  Do you have an symptoms or problems not managed by your medications? Yes, her insulin, she is wanting to discuss the dose with her current blood sugar readings.   Any concerns about your health right now? Yes, patient has lost a lot of weight and would like to discuss how to gain some weight back .  Has your provider asked that you check blood pressure, blood sugar, or follow special diet at home? Yes, patient is checking her blood sugars, she is not checking her blood pressure, she does not have a monitor. She does  try to watch how many carbs and sugars she is eating.  Do you get any type of exercise on a regular basis? Nothing structured, she does have stairs in her home and is going up and down often throughout the day  Can you think of a goal you would like to reach for your health? Patient would like to gain some weight.   Do you have any problems getting your medications? Patient denies any issues.    Is there anything that you would like to discuss during the appointment? Patient has no other issues at this time.   Please bring medications and supplements to appointment   Care Gaps: AWV - message sent to Ramond Craver Last BP - 108/70 on 08/27/2021 Last A1C - 11.1 on 08/27/2021 Hep C Screen - never done Tdap - overdue Foot exam - overdue Flu - due Pneumonia vaccine - postponed Mammogram - postponed Eye exam - postponed Dexa scan - postponed Colonoscopy - postponed Covid booster - postponed Shingrix - postponed  Star Rating Drugs: Atorvastatin 40 mg - last filled 09/01/2021 90 [facility] Losartan 50 mg - last filled 04/17/2021 90 [facility] verified with Gerald Stabs Metformin 1000 mg - last filled 08/05/2021 90 [facility] Trulicity 8.1JS/3.1RX - last filled 04/17/2021 28 [facility] verified with [place] Pharmacist Assistant [phone]

## 2021-09-17 ENCOUNTER — Telehealth: Payer: Self-pay | Admitting: Pharmacist

## 2021-09-17 NOTE — Chronic Care Management (AMB) (Signed)
    Chronic Care Management Pharmacy Assistant   Name: [name] [name]  MRN: [id number] DOB: 28-May-1950  09/18/2021 APPOINTMENT REMINDER  Called [name] [name], No answer, unable to leave a message of appointment on 09/18/2021 at 2:00 via office visit with Jeni Salles, Pharm D. Notified on 09/16/2021 during chart prep to have all medications, supplements, blood pressure monitor, blood pressure and/or blood sugar logs available during appointment and to return call if need to reschedule.  Care Gaps: AWV - message sent to Ramond Craver Last BP - 108/70 on 08/27/2021 Last A1C - 11.1 on 08/27/2021 Hep C Screen - never done Tdap - overdue Foot exam - overdue Flu - due Pneumonia vaccine - postponed Mammogram - postponed Eye exam - postponed Dexa scan - postponed Colonoscopy - postponed Covid booster - postponed Shingrix - postponed  Star Rating Drug: Atorvastatin 40 mg - last filled 09/01/2021 [facility] Losartan 50 mg - last filled 04/17/2021 90 [facility] verified with Gerald Stabs Metformin 1000 mg - last filled 08/05/2021 90 [facility] Trulicity 1.'5mg'$ /0.68m - last filled 04/17/2021 28 [facility] verified with CGerald Stabs Any gaps in medications fill history? Yes  JAntimonyPharmacist Assistant [phone]

## 2021-09-18 ENCOUNTER — Other Ambulatory Visit: Payer: Self-pay | Admitting: Internal Medicine

## 2021-09-18 ENCOUNTER — Telehealth: Payer: Self-pay | Admitting: Pharmacist

## 2021-09-18 ENCOUNTER — Ambulatory Visit (INDEPENDENT_AMBULATORY_CARE_PROVIDER_SITE_OTHER): Payer: Medicare HMO | Admitting: Pharmacist

## 2021-09-18 VITALS — BP 118/64

## 2021-09-18 DIAGNOSIS — R6881 Early satiety: Secondary | ICD-10-CM

## 2021-09-18 DIAGNOSIS — R634 Abnormal weight loss: Secondary | ICD-10-CM

## 2021-09-18 DIAGNOSIS — E1165 Type 2 diabetes mellitus with hyperglycemia: Secondary | ICD-10-CM

## 2021-09-18 DIAGNOSIS — I10 Essential (primary) hypertension: Secondary | ICD-10-CM

## 2021-09-18 DIAGNOSIS — E785 Hyperlipidemia, unspecified: Secondary | ICD-10-CM

## 2021-09-18 MED ORDER — CAREFINE PEN NEEDLES 32G X 4 MM MISC: 5 refills | Status: DC

## 2021-09-18 MED ORDER — TRESIBA FLEXTOUCH 100 UNIT/ML ~~LOC~~ SOPN: 10.0000 [IU] | PEN_INJECTOR | Freq: Every day | SUBCUTANEOUS | 3 refills | Status: DC

## 2021-09-18 MED ORDER — METFORMIN HCL 1000 MG PO TABS: ORAL_TABLET | ORAL | 0 refills | Status: DC

## 2021-09-18 MED ORDER — ATORVASTATIN CALCIUM 40 MG PO TABS: 40.0000 mg | ORAL_TABLET | Freq: Every day | ORAL | 0 refills | Status: DC

## 2021-09-18 NOTE — Patient Instructions (Signed)
Look out for calls from [place] [phone] for your [place] [phone]   [place], PharmD, [place] [facility] [place]

## 2021-09-18 NOTE — Progress Notes (Signed)
Chronic Care Management Pharmacy Note  09/19/2021 Name:  [name] [name] MRN:  [id number] DOB:  [birth year]  Summary: A1c not at goal < 7% BP at goal < 130/80 off medications LDL not at goal < 70  Recommendations/Changes made from today's visit: -Recommended increasing Tresiba by 2 units every 2-3 days to target fasting blood sugar < 130 or if she reaches maximum of 24 units based on current weight -Recommended purchasing an arm BP cuff to check readings at home -Recommended moving atorvastatin to dinner with metformin to improve compliance and repeat lipid panel in 3 months -Recommend DEXA scan  Plan: Follow up in office for Dexcom instructions  Subjective: [name] [name] is an 72 y.o. year old female who is a primary patient of Panosh, Standley Brooking, MD.  The CCM team was consulted for assistance with disease management and care coordination needs.    Engaged with patient face to face for initial visit in response to provider referral for pharmacy case management and/or care coordination services.   Consent to Services:  The patient was given the following information about Chronic Care Management services today, agreed to services, and gave verbal consent: 1. CCM service includes personalized support from designated clinical staff supervised by the primary care provider, including individualized plan of care and coordination with other care providers 2. 24/7 contact phone numbers for assistance for urgent and routine care needs. 3. Service will only be billed when office clinical staff spend 20 minutes or more in a month to coordinate care. 4. Only one practitioner may furnish and bill the service in a calendar month. 5.The patient may stop CCM services at any time (effective at the end of the month) by phone call to the office staff. 6. The patient will be responsible for cost sharing (co-pay) of up to 20% of the service fee (after annual deductible is met). Patient agreed to services and  consent obtained.  Patient Care Team: Panosh, Standley Brooking, MD as PCP - General Haygood, Seymour Bars, MD (Inactive) as Attending Physician (Obstetrics and Gynecology) Viona Gilmore, [facility] as Pharmacist (Pharmacist)  Recent office visits: 08/27/2021 [name] MD - Patient was seen for Type 2 diabetes mellitus with hyperglycemia, without long-term current use of insulin and additional issues. Discontinued Glimepiride, Pioglitazone and Valsartan. Prescribed vitamin D 50,000 units once weekly x 12 weeks.  Recent consult visits: None in previous 6 months  Hospital visits: Patient was seen at [facility] ED on 08/27/2021 (5 hours) due to Hyperglycemia.    New?Medications Started at [facility] Discharge:?? No medications started Medication Changes at Hospital Discharge: No medications changed Medications Discontinued at Hospital Discharge: No medications discontinued Medications that remain the same after Hospital Discharge:??  -All other medications will remain the same.     Objective:  Lab Results  Component Value Date   CREATININE 0.55 08/27/2021   BUN 12 08/27/2021   GFR 92.82 08/27/2021   GFRNONAA >60 08/27/2021   GFRAA 96 02/23/2007   NA 134 (L) 08/27/2021   K 4.1 08/27/2021   CALCIUM 9.1 08/27/2021   CO2 22 08/27/2021   GLUCOSE 337 (H) 08/27/2021    Lab Results  Component Value Date/Time   HGBA1C 11.1 (A) 08/27/2021 11:39 AM   HGBA1C 11.0 (H) 11/05/2020 11:16 AM   HGBA1C 12.3 (H) 06/20/2020 09:39 AM   GFR 92.82 08/27/2021 12:13 PM   GFR 92.96 11/05/2020 11:16 AM   MICROALBUR <0.7 06/20/2020 09:39 AM   MICROALBUR <0.7 01/04/2019 10:05 AM  Last diabetic Eye exam:  Lab Results  Component Value Date/Time   HMDIABEYEEXA No Retinopathy 10/01/2016 12:00 AM    Last diabetic Foot exam:  Lab Results  Component Value Date/Time   HMDIABFOOTEX Done 05/25/2009 12:00 AM     Lab Results  Component Value Date   CHOL 211 (H) 08/27/2021   HDL 72.30  08/27/2021   LDLCALC 73 10/09/2011   LDLDIRECT 105.0 08/27/2021   TRIG 277.0 (H) 08/27/2021   CHOLHDL 3 08/27/2021       Latest Ref Rng & Units 08/27/2021   12:13 PM 11/05/2020   11:16 AM 06/20/2020    9:39 AM  Hepatic Function  Total Protein 6.0 - 8.3 g/dL 6.1  6.0  6.1   Albumin 3.5 - 5.2 g/dL 3.9  3.9  3.9   AST 0 - 37 U/L _0 ALT 0 - 35 U/L _1 Alk Phosphatase 39 - 117 U/L 56  54  75   Total Bilirubin 0.2 - 1.2 mg/dL 0.6  0.8  0.8   Bilirubin, Direct 0.0 - 0.3 mg/dL 0.1  0.1  0.1     Lab Results  Component Value Date/Time   TSH 1.58 08/27/2021 12:13 PM   TSH 1.61 06/20/2020 09:39 AM   FREET4 1.07 08/27/2021 12:13 PM   FREET4 1.04 06/20/2020 09:39 AM       Latest Ref Rng & Units 08/27/2021    5:53 PM 08/27/2021   12:13 PM 06/20/2020    9:39 AM  CBC  WBC 4.0 - 10.5 K/uL 6.0  5.7  6.2   Hemoglobin 12.0 - 15.0 g/dL 15.7  14.9  15.8   Hematocrit 36.0 - 46.0 % 44.2  43.8  45.0   Platelets 150 - 400 K/uL 328  344.0  321.0     Lab Results  Component Value Date/Time   VD25OH 13.16 (L) 08/27/2021 12:13 PM   VD25OH 44 03/27/2009 08:11 PM   VD25OH 22 (L) 09/05/2008 09:48 PM    Clinical ASCVD: No  The 10-year ASCVD risk score (Arnett DK, et al., 2019) is: 17.2%   Values used to calculate the score:     Age: 71 years     Sex: Female     Is Non-Hispanic African American: No     Diabetic: Yes     Tobacco smoker: No     Systolic Blood Pressure: 876 mmHg     Is BP treated: Yes     HDL Cholesterol: 72.3 mg/dL     Total Cholesterol: 211 mg/dL       08/27/2021   11:34 AM 11/05/2020   11:15 AM 12/08/2017    9:38 AM  Depression screen PHQ 2/9  Decreased Interest 0 0 0  Down, Depressed, Hopeless 0 0 0  PHQ - 2 Score 0 0 0  Altered sleeping 2 2   Tired, decreased energy 2 2   Change in appetite 0 1   Feeling bad or failure about yourself  0 0   Trouble concentrating 0 0   Moving slowly or fidgety/restless 0 0   Suicidal thoughts 0 0   PHQ-9 Score 4 5    Difficult doing work/chores Not difficult at all Somewhat difficult       Social History   Tobacco Use  Smoking Status Never  Smokeless Tobacco Never   BP Readings from Last 3 Encounters:  09/18/21 118/64  08/27/21 (!) 150/86  08/27/21 108/70   Pulse  Readings from Last 3 Encounters:  08/27/21 96  08/27/21 (!) 120  11/05/20 (!) 112   Wt Readings from Last 3 Encounters:  08/27/21 107 lb 9.6 oz (48.8 kg)  11/05/20 127 lb 12.8 oz (58 kg)  06/18/20 142 lb (64.4 kg)   BMI Readings from Last 3 Encounters:  08/27/21 18.47 kg/m  11/05/20 21.94 kg/m  06/18/20 24.37 kg/m    Assessment/Interventions: Review of patient past medical history, allergies, medications, health status, including review of consultants reports, laboratory and other test data, was performed as part of comprehensive evaluation and provision of chronic care management services.   SDOH:  (Social Determinants of Health) assessments and interventions performed: Yes SDOH Interventions    Flowsheet Row Most Recent Value  SDOH Interventions   Financial Strain Interventions Intervention Not Indicated  Transportation Interventions Intervention Not Indicated      SDOH Screenings   Alcohol Screen: Not on file  Depression (PHQ2-9): Low Risk  (08/27/2021)   Depression (PHQ2-9)    PHQ-2 Score: 4  Financial Resource Strain: Low Risk  (09/18/2021)   Overall Financial Resource Strain (CARDIA)    Difficulty of Paying Living Expenses: Not very hard  Food Insecurity: Not on file  Housing: Not on file  Physical Activity: Not on file  Social Connections: Not on file  Stress: Not on file  Tobacco Use: Low Risk  (08/27/2021)   Patient History    Smoking Tobacco Use: Never    Smokeless Tobacco Use: Never    Passive Exposure: Not on file  Transportation Needs: No Transportation Needs (09/18/2021)   PRAPARE - Transportation    Lack of Transportation (Medical): No    Lack of Transportation (Non-Medical): No   Patient  is retired and has bad eye sight so this limits her ability to drive. She lives with her son and grandson who she relies on for transportation. Her son is working full time so he is not available quite as much. Her grandson brought her today and he just graduated high school this year with plans to start college classes next week.  She also had a two year old Beagle who she brings on brief walks and describes as mellow, which she enjoys. She just turned 4.  Patient doesn't go out of the yard as her Beagle gets skiddish and doesn't have a whole lot of land. She doesn't exercise regularly but is worried because she has lost so much weight and muscle.  Patient was eating less than she was a few years ago. She stopped taking Trulicity and she wasn't even eating because of how sick she was. She has only had 1 dose in the last few months back in June and she said it still made her feel sick. Patient reports her appetite is getting better since stopping the Trulicity  Patient has been taking the insulin since she saw Dr. Regis Bill a few weeks ago. She is very interested in getting set up with a CGM to check her blood sugars. She is less nervous with taking the insulin and inquired about timing of it in regards to her day. She was told in the ED not to give it at bedtime and this worried her.  Patient reports she has had trouble sleeping and sometimes doesn't go to bed until 4-5 AM.    CCM Care Plan  Allergies  Allergen Reactions   Naproxen     REACTION: Dizziness and nausea, slowed down pt's reaction time   Simvastatin     Joint Pain  Medications Reviewed Today     Reviewed by Viona Gilmore, [facility] (Pharmacist) on 09/18/21 at [place] List Status: <None>   Medication Order Taking? Sig Documenting Provider Last Dose Status Informant  Accu-Chek Softclix Lancets lancets [id number]  Use as instructed Panosh, Standley Brooking, MD  Active   atorvastatin (LIPITOR) 40 MG tablet [id number] Yes Take 1 tablet (40 mg  total) by mouth daily. Panosh, Standley Brooking, MD Taking Active   Blood Glucose Monitoring Suppl (ACCU-CHEK GUIDE) w/Device KIT [id number]  Daily use Panosh, Standley Brooking, MD  Active   Cholecalciferol (VITAMIN D) 125 MCG (5000 UT) CAPS [id number] No Take 50,000 Units by mouth once a week.  Patient not taking: Reported on 09/18/2021   Burnis Medin, MD Not Taking Active   Dulaglutide (TRULICITY) 1.17 BV/6.7OL SOPN [id number] No Inject 0.75 mg into the skin once a week.  Patient not taking: Reported on 09/18/2021   Burnis Medin, MD Not Taking Active            Med Note [facility], [place] Aug 27, 2021 12:53 PM) Taking infrequently  FREESTYLE UNISTICK II LANCETS MISC [id number]  Use to check blood sugar 1-2 times daily Philemon Kingdom, MD  Active   glucose blood (ACCU-CHEK GUIDE) test strip [id number]  Use 1-2 times daily Panosh, Standley Brooking, MD  Active   glucose blood (FREESTYLE LITE) test strip [id number]  Use as instructed Panosh, Standley Brooking, MD  Active   Glucose Blood (FREESTYLE TEST VI) [id number]  by In Vitro route. [provider]  Active   glucose blood test strip [id number]  Use to check blood sugar 1-2 times daily Panosh, Standley Brooking, MD  Active   insulin degludec (TRESIBA FLEXTOUCH) 100 UNIT/ML FlexTouch Pen [id number] Yes Inject 10 Units into the skin daily. Panosh, Standley Brooking, MD Taking Active   Insulin Pen Needle (CAREFINE PEN NEEDLES) 32G X 4 MM MISC [id number]  Use to inject insulin daily Panosh, Standley Brooking, MD  Active   losartan (COZAAR) 50 MG tablet [id number] No Take 1 tablet (50 mg total) by mouth daily.  Patient not taking: Reported on 09/18/2021   Burnis Medin, MD Not Taking Active   metFORMIN (GLUCOPHAGE) 1000 MG tablet [id number] Yes TAKE 1 TABLET (1000 MG TOTAL) BY MOUTH 2 (TWO) TIMES DAILY WITH A MEAL. Panosh, Standley Brooking, MD Taking Active             Patient Active Problem List   Diagnosis Date Noted   Type 2 diabetes mellitus with hyperglycemia, without long-term current use of insulin ([place])  03/03/2016   Lump in neck 07/28/2014   Pain in shoulder region 07/28/2014   Jaw pain, non-TMJ 10/06/2013   Leg pain, lateral 06/18/2012   Skin lesion 01/15/2012   IBS (irritable bowel syndrome) 10/07/2011   Breast nodule 08/11/2011   Intentional underdosing of medication regimen by patient due to financial hardship 05/30/2011   Stress at home 05/30/2011   Breast tenderness in female 01/10/2011   Contact dermatitis and eczema due to plant 09/12/2010   NON ADHERENCE  TO MEDICATION 01/21/2010   OBESITY 09/10/2009   PLANTAR FASCIITIS, BILATERAL 05/25/2009   CHOLELITHIASIS 10/09/2008   SCIATICA 10/09/2008   THYROID FUNCTION TEST, ABNORMAL 10/09/2008   IBS 07/12/2008   LEG PAIN, LEFT 07/12/2008   SHOULDER PAIN, LEFT 07/07/2007   HIP PAIN, LEFT 07/07/2007   Vitamin D deficiency 03/22/2007   Hyperlipidemia 01/12/2007   Essential hypertension 01/12/2007   ALLERGIC RHINITIS 01/12/2007  MUSCULOSKELETAL PAIN 01/12/2007    Immunization History  Administered Date(s) Administered   Fluad Quad(high Dose 65+) 11/30/2018, 11/05/2020   H1N1 04/28/2008   Influenza Split 01/10/2011, 01/15/2012   Influenza Whole 01/12/2007, 05/11/2008, 01/21/2010   Influenza, High Dose Seasonal PF 12/08/2017   Influenza,inj,Quad PF,6+ Mos 10/19/2012, 10/06/2013, 03/22/2015, 01/18/2016, 12/07/2016   Influenza-Unspecified 10/19/2012, 10/06/2013, 03/22/2015, 01/18/2016, 12/07/2016   PFIZER(Purple Top)SARS-COV-2 Vaccination 05/24/2019, 06/15/2019   Pneumococcal Polysaccharide-23 01/21/2010   Td 02/10/2005    Conditions to be addressed/monitored:  Hypertension, Hyperlipidemia, Diabetes, and Vitamin D deficiency  Care Plan : Pharmacy Care Plan  Updates made by Viona Gilmore, [place] since 09/19/2021 12:00 AM     Problem: Problem: Hypertension, Hyperlipidemia, Diabetes, and Vitamin D deficiency      Long-Range Goal: Patient-Specific Goal   Start Date: 09/18/2021  Expected End Date: 09/19/2022  This Visit's  Progress: On track  Priority: High  Note:   Current Barriers:  Unable to independently monitor therapeutic efficacy Unable to achieve control of diabetes  Unable to maintain control of cholesterol Unable to self administer medications as prescribed  Pharmacist Clinical Goal(s):  Patient will achieve adherence to monitoring guidelines and medication adherence to achieve therapeutic efficacy achieve control of diabetes as evidenced by A1c maintain control of cholesterol as evidenced by next lipid panel  through collaboration with PharmD and provider.   Interventions: 1:1 collaboration with Panosh, Standley Brooking, MD regarding development and update of comprehensive plan of care as evidenced by provider attestation and co-signature Inter-disciplinary care team collaboration (see longitudinal plan of care) Comprehensive medication review performed; medication list updated in electronic medical record  Hypertension (BP goal <130/80) -Controlled -Current treatment: Losartan 50 mg 1 tablet daily - off for 3 weeks -Medications previously tried: none  -Current home readings: does not check at home; does not own a BP cuff yet but plans to purchase one -Current dietary habits: cooks with very little salt, ocasional TV dinner; soup now and then; looking at sodium content -Current exercise habits: no structured exercise; worried about falling -Denies hypotensive/hypertensive symptoms -Educated on BP goals and benefits of medications for prevention of heart attack, stroke and kidney damage; Importance of home blood pressure monitoring; Proper BP monitoring technique; Symptoms of hypotension and importance of maintaining adequate hydration; -Counseled to monitor BP at home a few times a week, document, and provide log at future appointments -Counseled on diet and exercise extensively Recommended purchasing a BP cuff and start to monitor at home.  Hyperlipidemia: (LDL goal < 70, TGs <  150) -Uncontrolled -Current treatment: Atorvastatin 40 mg 1 tablet daily - Appropriate, Query effective, Safe, Accessible -Medications previously tried: none  -Current dietary patterns: doesn't eat much fried foods -Current exercise habits: no structured exercise -Educated on Cholesterol goals;  Benefits of statin for ASCVD risk reduction; Importance of limiting foods high in cholesterol; Exercise goal of 150 minutes per week; -Counseled on diet and exercise extensively Recommended to continue current medication Recommended repeat lipid panel in 3 months after compliant with treatment.  Diabetes (A1c goal <7%) -Uncontrolled -Current medications: Tyler Aas inject 10 units into the skin daily  - Appropriate, Query effective, Safe, Accessible Trulicity 8.11 mg inject once weekly - not taking Metformin 1000 mg 1 tablet twice daily - Appropriate, Query effective, Safe, Accessible -Medications previously tried: none  -Current home glucose readings fasting glucose: highest was 404, lowest was 153, 238, 229, 241, 231, 223, 238 post prandial glucose: 285, 339, 271, 286, 294, 268, 346 -Reports hypoglycemic/hyperglycemic symptoms -Current meal patterns:  sometimes 2 meals a day; appetite varies breakfast: skips  lunch: mini subs, frozen pizza, sometimes sandwiches dinner: stir fry, baked chicken & spaghetti, pork chops snacks: was eating more snacks with higher blood sugars; nuts more now; was eating more desserts, craves sugar  drinks: water with flavors (3 cups a day), diet drinks -Current exercise: no structured exercise right now -Educated on A1c and blood sugar goals; Complications of diabetes including kidney damage, retinal damage, and cardiovascular disease; Exercise goal of 150 minutes per week; Benefits of routine self-monitoring of blood sugar; Continuous glucose monitoring; Carbohydrate counting and/or plate method -Counseled to check feet daily and get yearly eye  exams -Provided healthy plate handout. -Counseled on diet and exercise extensively Recommended increasing Tresiba by 2 units every 2-3 days to target fasting blood sugar < 130. Recommended starting on Dexcom CGM and submitted order.  Vitamin D deficiency (Goal: 30-100) -Uncontrolled -Current treatment  Vitamin D 50,000 units 1 capsule weekly - did not pick up yet -Medications previously tried: none  -Counseled on importance of taking vitamin D with a meal. Recommended DEXA scan.  Health Maintenance -Vaccine gaps: influenza, shingrix, tetanus, Prevnar20 -Current therapy:  No medications -Educated on Cost vs benefit of each product must be carefully weighed by individual consumer -Patient is satisfied with current therapy and denies issues -Recommended to continue as is  Patient Goals/Self-Care Activities Patient will:  - take medications as prescribed as evidenced by patient report and record review focus on medication adherence by using a pill box or adherence packaging. check glucose a few times a day, document, and provide at future appointments check blood pressure weekly, document, and provide at future appointments target a minimum of 150 minutes of moderate intensity exercise weekly  Follow Up Plan: The care management team will reach out to the patient again over the next 7 days.       Medication Assistance: None required.  Patient affirms current coverage meets needs.   Compliance/Adherence/Medication fill history: Care Gaps: Hep C screening, tetanus, foot exam, influenza, Prevnar20, mammogram, eye exam, DEXA, colonscopy, COVID booster, shingrix Last BP - 108/70 on 08/27/2021 Last A1C - 11.1 on 08/27/2021  Star-Rating Drugs: Atorvastatin 40 mg - [facility] Losartan 50 mg - last filled [facility] with Gerald Stabs Metformin 1000 mg - last filled [facility]/05/2021 [facility] Trulicity 6.2BW/3.8LH - last filled 04/17/2021 [facility] with Gerald Stabs  Patient's preferred pharmacy is:  [facility], [place] - [address] [place] [address] Phone: [phone] Fax: [phone]  [facility] - [place], [place] - [address] [place] [address] Phone: [phone] Fax: [phone]  Uses pill box? No - doesn't like to keep up with one Pt endorses 80% compliance - mostly missing doses of atorvastatin  We discussed: Benefits of medication synchronization, packaging and delivery as well as enhanced pharmacist oversight with [facility]. Patient decided to: Utilize [facility] for medication synchronization, packaging and delivery  Care Plan and Follow Up Patient Decision:  Patient agrees to Care Plan and Follow-up.  Plan: The care management team will reach out to the patient again over the next 7 days.  Jeni Salles, PharmD, [place] Pharmacist [place] at [place]

## 2021-09-18 NOTE — Chronic Care Management (AMB) (Unsigned)
    Chronic Care Management Pharmacy Assistant   Name: [name] [name]  MRN: [id number] DOB: Nov 10, 1950  BP Readings from Last 3 Encounters:  09/18/21 118/64  08/27/21 (!) 150/86  08/27/21 108/70    Lab Results  Component Value Date   HGBA1C 11.1 (A) 08/27/2021    Verbal consent obtained for [facility] enhanced pharmacy services (medication synchronization, adherence packaging, delivery coordination). A medication sync plan was created to allow patient to get all medications delivered once every 30 to 90 days per patient preference. Patient understands they have freedom to choose pharmacy and clinical pharmacist will coordinate care between all prescribers and [facility].  Patient requested to obtain medications through {Packaging choices:23805}  {Day Supply:23806}   Med Sync Plan***  Patient will need a short fill:   Prescriptions requested from PCP and specialists.  Medications: Outpatient Encounter Medications as of 09/18/2021  Medication Sig Note   Accu-Chek Softclix Lancets lancets Use as instructed    atorvastatin (LIPITOR) 40 MG tablet TAKE 1 TABLET BY MOUTH EVERY DAY    Blood Glucose Monitoring Suppl (ACCU-CHEK GUIDE) w/Device KIT Daily use    Cholecalciferol (VITAMIN D) 125 MCG (5000 UT) CAPS Take 50,000 Units by mouth once a week. (Patient not taking: Reported on 09/18/2021)    Dulaglutide (TRULICITY) 4.26 ST/4.1DQ SOPN Inject 0.75 mg into the skin once a week. (Patient not taking: Reported on 09/18/2021) 08/27/2021: Taking infrequently   FREESTYLE UNISTICK II LANCETS MISC Use to check blood sugar 1-2 times daily    glucose blood (ACCU-CHEK GUIDE) test strip Use 1-2 times daily    glucose blood (FREESTYLE LITE) test strip Use as instructed    Glucose Blood (FREESTYLE TEST VI) by In Vitro route.    glucose blood test strip Use to check blood sugar 1-2 times daily    insulin degludec (TRESIBA FLEXTOUCH) 100 UNIT/ML FlexTouch Pen Inject 10 Units into the skin daily.  (Patient not taking: Reported on 08/27/2021)    Insulin Pen Needle (CAREFINE PEN NEEDLES) 32G X 4 MM MISC Use to inject insulin daily    losartan (COZAAR) 50 MG tablet Take 1 tablet (50 mg total) by mouth daily. (Patient not taking: Reported on 09/18/2021)    metFORMIN (GLUCOPHAGE) 1000 MG tablet TAKE 1 TABLET (1000 MG TOTAL) BY MOUTH 2 (TWO) TIMES DAILY WITH A MEAL.    No facility-administered encounter medications on file as of 09/18/2021.    Care Gaps: AWV - message sent to Ramond Craver Last BP - 108/70 on 08/27/2021 Last A1C - 11.1 on 08/27/2021 Hep C Screen - never done Tdap - overdue Foot exam - overdue Flu - due Pneumonia vaccine - postponed Mammogram - postponed Eye exam - postponed Dexa scan - postponed Colonoscopy - postponed Covid booster - postponed Shingrix - postponed  Star Rating Drugs: Atorvastatin 40 mg - last [facility] Losartan 50 mg - last filled [facility]17/2021 [facility] verified with Gerald Stabs Metformin 1000 mg - last filled 08/05/2021 [facility] Trulicity 1.$RemoveBeforeD'5mg'[id number]$ /0.57ml - last filled [facility]/17/2021 [facility] verified with [place] Pharmacist Assistant [phone]

## 2021-09-18 NOTE — Telephone Encounter (Signed)
Refills sent

## 2021-09-18 NOTE — Telephone Encounter (Signed)
-----   Message from Viona Gilmore, [facility] sent at 09/18/2021  3:20 PM EDT ----- Regarding: Refills Hi,  [name] is going to try out [facility] to get her meds delivered since she doesn't drive. Can you send in refills of her Tyler Aas, pen needles, metformin and atorvastatin to them? She is almost completely out of the atorvastatin. I updated her pharmacy list to include [facility].  Thank you! Maddie

## 2021-09-20 ENCOUNTER — Other Ambulatory Visit: Payer: Self-pay | Admitting: Internal Medicine

## 2021-09-20 DIAGNOSIS — R634 Abnormal weight loss: Secondary | ICD-10-CM

## 2021-09-20 DIAGNOSIS — E2839 Other primary ovarian failure: Secondary | ICD-10-CM

## 2021-09-20 NOTE — Progress Notes (Signed)
I placed order  for dexa scan at the [facility]  Please arrange or have her arrange dexa appt.  Thanks

## 2021-09-20 NOTE — Telephone Encounter (Signed)
Called patient to provide contact information for scheduling CT of abdomen. Patient is also aware to continue to titrate [place] on her own to target fasting blood sugars < 130. Plan to increase dose by 2 units every 2-3 days until at a max of 24 based on her weight or if fasting blood sugars are achieved before then. Patient will call back when she receives the Dexcom to schedule an office visit.

## 2021-09-24 ENCOUNTER — Telehealth: Payer: Self-pay | Admitting: Internal Medicine

## 2021-09-24 DIAGNOSIS — R634 Abnormal weight loss: Secondary | ICD-10-CM

## 2021-09-24 DIAGNOSIS — Z87898 Personal history of other specified conditions: Secondary | ICD-10-CM

## 2021-09-24 NOTE — Telephone Encounter (Signed)
Dr. Levada Schilling from [facility] states he needs recent chest xray to get the chest CT approved.

## 2021-09-25 NOTE — Telephone Encounter (Signed)
Please order chest x ray   to ge t ct approved dx  unintentional weight loss  history of pulmonary nodule

## 2021-09-26 NOTE — Telephone Encounter (Signed)
Chest xray is placed.   Recalled when speaking with Dr. Levada Schilling, he had said CT order would expired in 30 hours from the time spoke with him. Attempted to follow up with him. Left a voicemail to call us back.

## 2021-10-01 ENCOUNTER — Ambulatory Visit (INDEPENDENT_AMBULATORY_CARE_PROVIDER_SITE_OTHER): Payer: Medicare HMO

## 2021-10-01 VITALS — Ht 64.0 in | Wt 107.0 lb

## 2021-10-01 DIAGNOSIS — Z0000 Encounter for general adult medical examination without abnormal findings: Secondary | ICD-10-CM

## 2021-10-01 NOTE — Progress Notes (Signed)
Subjective:   [name] [name] is a 71 y.o. female who presents for Medicare Annual (Subsequent) preventive examination.  Review of Systems    Virtual Visit via Telephone Note  I connected with  [name] [name] on 10/01/21 at  1:00 PM EDT by telephone and verified that I am speaking with the correct person using two identifiers.  Location: Patient: Home Provider: Office Persons participating in the virtual visit: patient/Nurse Health Advisor   I discussed the limitations, risks, security and privacy concerns of performing an evaluation and management service by telephone and the availability of in person appointments. The patient expressed understanding and agreed to proceed.  Interactive audio and video telecommunications were attempted between this nurse and patient, however failed, due to patient having technical difficulties OR patient did not have access to video capability.  We continued and completed visit with audio only.  Some vital signs may be absent or patient reported.   [name] Peaches, LPN  Cardiac Risk Factors include: advanced age (>66mn, >>82women);diabetes mellitus;hypertension     Objective:    Today's Vitals   10/01/21 1251  Weight: 107 lb (48.5 kg)  Height: 5' 4"  (1.626 m)   Body mass index is 18.37 kg/m.     10/01/2021   12:59 PM 08/27/2021    4:55 PM  Advanced Directives  Does Patient Have a Medical Advance Directive? No No  Would patient like information on creating a medical advance directive? No - Patient declined No - Patient declined    Current Medications (verified) Outpatient Encounter Medications as of 10/01/2021  Medication Sig   Accu-Chek Softclix Lancets lancets Use as instructed   atorvastatin (LIPITOR) 40 MG tablet Take 1 tablet (40 mg total) by mouth daily.   Blood Glucose Monitoring Suppl (ACCU-CHEK GUIDE) w/Device KIT Daily use   Cholecalciferol (VITAMIN D) 125 MCG (5000 UT) CAPS Take 50,000 Units by mouth once a week.  (Patient not taking: Reported on 09/18/2021)   Dulaglutide (TRULICITY) 04.12MIN/8.6VESOPN Inject 0.75 mg into the skin once a week. (Patient not taking: Reported on 09/18/2021)   FREESTYLE UNISTICK II LANCETS MISC Use to check blood sugar 1-2 times daily   glucose blood (ACCU-CHEK GUIDE) test strip Use 1-2 times daily   glucose blood (FREESTYLE LITE) test strip Use as instructed   Glucose Blood (FREESTYLE TEST VI) by In Vitro route.   glucose blood test strip Use to check blood sugar 1-2 times daily   insulin degludec (TRESIBA FLEXTOUCH) 100 UNIT/ML FlexTouch Pen Inject 10 Units into the skin daily.   Insulin Pen Needle (CAREFINE PEN NEEDLES) 32G X 4 MM MISC Use to inject insulin daily   losartan (COZAAR) 50 MG tablet Take 1 tablet (50 mg total) by mouth daily. (Patient not taking: Reported on 09/18/2021)   metFORMIN (GLUCOPHAGE) 1000 MG tablet TAKE 1 TABLET (1000 MG TOTAL) BY MOUTH 2 (TWO) TIMES DAILY WITH A MEAL.   No facility-administered encounter medications on file as of 10/01/2021.    Allergies (verified) Naproxen and Simvastatin   History: Past Medical History:  Diagnosis Date   Allergic rhinitis    Diabetes mellitus    Hyperlipidemia    Hypertension    IBS (irritable bowel syndrome)    Normal nuclear stress test 1 /08   low risk study    Sciatica    Past Surgical History:  Procedure Laterality Date   CESAREAN SECTION     x 3; 872,09,47  NM MYOVIEW LTD  02/2006   low risk  study   Family History  Problem Relation Age of Onset   Acute myelogenous leukemia Mother        died when pat age 70    Diabetes Father    Diabetes Paternal Grandfather    Breast cancer Neg Hx    Social History   Socioeconomic History   Marital status: Legally Separated    Spouse name: Not on file   Number of children: Not on file   Years of education: Not on file   Highest education level: Not on file  Occupational History   Not on file  Tobacco Use   Smoking status: Never   Smokeless  tobacco: Never  Substance and Sexual Activity   Alcohol use: No   Drug use: No   Sexual activity: Not on file  Other Topics Concern   Not on file  Social History Narrative   Separated single mom   HH of 4   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          Social Determinants of Health   Financial Resource Strain: Low Risk  (10/01/2021)   Overall Financial Resource Strain (CARDIA)    Difficulty of Paying Living Expenses: Not hard at all  Food Insecurity: No Food Insecurity (10/01/2021)   Hunger Vital Sign    Worried About Running Out of Food in the Last Year: Never true    Ran Out of Food in the Last Year: Never true  Transportation Needs: No Transportation Needs (10/01/2021)   PRAPARE - Hydrologist (Medical): No    Lack of Transportation (Non-Medical): No  Physical Activity: Inactive (10/01/2021)   Exercise Vital Sign    Days of Exercise per Week: 0 days    Minutes of Exercise per Session: 0 min  Stress: No Stress Concern Present (10/01/2021)   [place]    Feeling of Stress : Not at all  Social Connections: Socially Isolated (10/01/2021)   Social Connection and Isolation Panel [NHANES]    Frequency of Communication with Friends and Family: More than three times a week    Frequency of Social Gatherings with Friends and Family: More than three times a week    Attends Religious Services: Never    Marine scientist or Organizations: No    Attends Archivist Meetings: Never    Marital Status: Divorced     Clinical Intake: Virtual Visit via Telephone Note  Nutrition Risk Assessment:  Has the patient had any N/V/D within the last 2 months?  No  Does the patient have any non-healing wounds?  No   Has the patient had any unintentional weight loss or weight gain?  No   Diabetes:  Is the patient diabetic?  Yes  If diabetic, was a CBG obtained today?  Yes CBG 109 Taken by patient Did the patient bring in their glucometer from home?  No  How often do you monitor your CBG's? Twice daily.   Financial Strains and Diabetes Management:  Are you having any financial strains with the device, your supplies or your medication? No .  Does the patient want to be seen by Chronic Care Management for management  of their diabetes?  No  Would the patient like to be referred to a Nutritionist or for Diabetic Management?  No   Diabetic Exams:  Diabetic Eye Exam: Completed No. Overdue for diabetic eye exam. Pt has been advised about the importance in completing this exam. A referral has been placed today. Message sent to referral coordinator for scheduling purposes. Advised pt to expect a call from office referred to regarding appt.  Diabetic Foot Exam: Completed No. Pt has been advised about the importance in completing this exam. Pt is scheduled for diabetic foot exam on Followed by PCP.   Some vital signs may be absent or patient reported.   [name] Peaches, LPN  Pre-visit preparation completed: No  Diabetic? Yes  Activities of Daily Living    10/01/2021   12:57 PM  In your present state of health, do you have any difficulty performing the following activities:  Hearing? 0  Vision? 0  Difficulty concentrating or making decisions? 0  Walking or climbing stairs? 0  Dressing or bathing? 0  Doing errands, shopping? 0  Preparing Food and eating ? N  Using the Toilet? N  In the past six months, have you accidently leaked urine? N  Do you have problems with loss of bowel control? N  Managing your Medications? N  Managing your Finances? N  Housekeeping or managing your Housekeeping? N    Patient Care Team: Panosh, Standley Brooking, MD as PCP - General Haygood, Seymour Bars, MD (Inactive) as  Attending Physician (Obstetrics and Gynecology) Viona Gilmore, [facility] as Pharmacist (Pharmacist)  Indicate any recent Medical Services you may have received from other than [facility] providers in the past year (date may be approximate).     Assessment:   This is a routine wellness examination for [place].  Hearing/Vision screen Hearing Screening - Comments:: No hearing difficulty Vision Screening - Comments:: Wears glasses.Followed by [place] issues and exercise activities discussed: Exercise limited by: None identified   Goals Addressed               This Visit's Progress     Patient stated (pt-stated)        Get my blood sugar under control and gain some of my weight back.       Depression Screen    10/01/2021   12:54 PM 08/27/2021   11:34 AM 11/05/2020   11:15 AM 12/08/2017    9:38 AM  PHQ 2/9 Scores  PHQ - 2 Score 0 0 0 0  PHQ- 9 Score 0 4 5     Fall Risk    10/01/2021   12:58 PM 08/27/2021   11:34 AM 11/05/2020   10:36 AM 12/08/2017    9:38 AM  Fall Risk   Falls in the past year? 0 0 0 No  Number falls in past yr: 0 0 0   Injury with Fall? 0 0 0   Risk for fall due to : No Fall Risks No Fall Risks    Follow up  Falls prevention discussed      FALL RISK PREVENTION PERTAINING TO THE HOME:  Any stairs in or around the home? Yes  If so, are there any without handrails? No  Home free of loose throw rugs in walkways, pet beds, electrical cords, etc? Yes  Adequate lighting in your home to reduce risk of falls? Yes   ASSISTIVE DEVICES UTILIZED TO PREVENT FALLS:  Life alert? No  Use of a cane, walker or  w/c? No  Grab bars in the bathroom? Yes  Shower chair or bench in shower? No  Elevated toilet seat or a handicapped toilet? No   TIMED UP AND GO:  Was the test performed? No . Audio Visit  Cognitive Function:        10/01/2021   12:59 PM  6CIT Screen  What Year? 0 points  What month? 0 points  What time? 0 points  Count back from 20  0 points  Months in reverse 0 points  Repeat phrase 0 points  Total Score 0 points    Immunizations Immunization History  Administered Date(s) Administered   Fluad Quad(high Dose 65+) 11/30/2018, 11/05/2020   H1N1 04/28/2008   Influenza Split 01/10/2011, 01/15/2012   Influenza Whole 01/12/2007, 05/11/2008, 01/21/2010   Influenza, High Dose Seasonal PF 12/08/2017   Influenza,inj,Quad PF,6+ Mos 10/19/2012, 10/06/2013, 03/22/2015, 01/18/2016, 12/07/2016   Influenza-Unspecified 10/19/2012, 10/06/2013, 03/22/2015, 01/18/2016, 12/07/2016   PFIZER(Purple Top)SARS-COV-2 Vaccination 05/24/2019, 06/15/2019   Pneumococcal Polysaccharide-23 01/21/2010   Td 02/10/2005    TDAP status: Due, Education has been provided regarding the importance of this vaccine. Advised may receive this vaccine at local pharmacy or Health Dept. Aware to provide a copy of the vaccination record if obtained from local pharmacy or Health Dept. Verbalized acceptance and understanding.  Flu Vaccine status: Up to date    Covid-19 vaccine status: Completed vaccines  Qualifies for Shingles Vaccine? Yes   Zostavax completed No   Shingrix Completed?: No.    Education has been provided regarding the importance of this vaccine. Patient has been advised to call insurance company to determine out of pocket expense if they have not yet received this vaccine. Advised may also receive vaccine at local pharmacy or Health Dept. Verbalized acceptance and understanding.  Screening Tests Health Maintenance  Topic Date Due   Hepatitis C Screening  Never done   FOOT EXAM  06/18/2021   INFLUENZA VACCINE  09/10/2021   Pneumonia Vaccine 46+ Years old (2 - PCV) 02/27/2022 (Originally 05/19/2017)   MAMMOGRAM  02/27/2022 (Originally 11/10/2019)   OPHTHALMOLOGY EXAM  02/27/2022 (Originally 10/01/2017)   DEXA SCAN  02/27/2022 (Originally 05/19/2017)   COLONOSCOPY (Pts 45-57yr Insurance coverage will need to be confirmed)  02/27/2022 (Originally  02/11/2016)   COVID-19 Vaccine (3 - Pfizer risk series) 02/27/2022 (Originally 07/13/2019)   Zoster Vaccines- Shingrix (1 of 2) 02/27/2022 (Originally 05/20/1969)   TETANUS/TDAP  10/02/2022 (Originally 02/11/2015)   HEMOGLOBIN A1C  02/27/2022   HPV VACCINES  Aged Out    Health Maintenance  Health Maintenance Due  Topic Date Due   Hepatitis C Screening  Never done   FOOT EXAM  06/18/2021   INFLUENZA VACCINE  09/10/2021        Bone Density status: Ordered 09/20/21. Pt provided with contact info and advised to call to schedule appt.  Lung Cancer Screening: (Low Dose CT Chest recommended if Age 69-80years, 30 pack-year currently smoking OR have quit w/in 15years.) does not qualify.     Additional Screening:  Hepatitis C Screening: does qualify; Completed   Vision Screening: Recommended annual ophthalmology exams for early detection of glaucoma and other disorders of the eye. Is the patient up to date with their annual eye exam?  Yes Who is the provider or what is the name of the office in which the patient attends annual eye exams[place] is not established with a provider, would they like to be referred to a provider to establish care? No .  Dental Screening: Recommended annual dental exams for proper oral hygiene  Community Resource Referral / Chronic Care Management:  CRR required this visit?  No   CCM required this visit?  No      Plan:     I have personally reviewed and noted the following in the patient's chart:   Medical and social history Use of alcohol, tobacco or illicit drugs  Current medications and supplements including opioid prescriptions. Patient is not currently taking opioid prescriptions. Functional ability and status Nutritional status Physical activity Advanced directives List of other physicians Hospitalizations, surgeries, and ER visits in previous 12 months Vitals Screenings to include cognitive, depression, and falls Referrals  and appointments  In addition, I have reviewed and discussed with patient certain preventive protocols, quality metrics, and best practice recommendations. A written personalized care plan for preventive services as well as general preventive health recommendations were provided to patient.     [name] Peaches, LPN   09/22/7515   Nurse Notes: None

## 2021-10-01 NOTE — Patient Instructions (Addendum)
Ms. [name] [name] , Thank you for taking time to come for your Medicare Wellness Visit. I appreciate your ongoing commitment to your health goals. Please review the following plan we discussed and let me know if I can assist you in the future.   These are the goals we discussed:  Goals       Monitor and Manage My Blood Sugar-Diabetes Type 2      Timeframe:  Long-Range Goal Priority:  High Start Date:                             Expected End Date:                       Follow Up Date 10/19/21    - check blood sugar at prescribed times - enter blood sugar readings and medication or insulin into daily log - take the blood sugar log to all doctor visits    Why is this important?   Checking your blood sugar at home helps to keep it from getting very high or very low.  Writing the results in a diary or log helps the doctor know how to care for you.  Your blood sugar log should have the time, date and the results.  Also, write down the amount of insulin or other medicine that you take.  Other information, like what you ate, exercise done and how you were feeling, will also be helpful.     Notes:       Patient stated (pt-stated)      Get my blood sugar under control and gain some of my weight back.      Track and Manage My Blood Pressure-Hypertension      Timeframe:  Long-Range Goal Priority:  High Start Date:                             Expected End Date:                       Follow Up Date 10/19/21    - check blood pressure weekly - choose a place to take my blood pressure (home, clinic or office, retail store) - write blood pressure results in a log or diary    Why is this important?   You won't feel high blood pressure, but it can still hurt your blood vessels.  High blood pressure can cause heart or kidney problems. It can also cause a stroke.  Making lifestyle changes like losing a little weight or eating less salt will help.  Checking your blood pressure at home and at different  times of the day can help to control blood pressure.  If the doctor prescribes medicine remember to take it the way the doctor ordered.  Call the office if you cannot afford the medicine or if there are questions about it.     Notes:         This is a list of the screening recommended for you and due dates:  Health Maintenance  Topic Date Due   Hepatitis C Screening: USPSTF Recommendation to screen - Ages 92-79 yo.  Never done   Complete foot exam   06/18/2021   Flu Shot  09/10/2021   Pneumonia Vaccine (2 - PCV) 02/27/2022*   Mammogram  02/27/2022*   Eye exam for diabetics  02/27/2022*   DEXA  scan (bone density measurement)  02/27/2022*   Colon Cancer Screening  02/27/2022*   COVID-19 Vaccine (3 - Pfizer risk series) 02/27/2022*   Zoster (Shingles) Vaccine (1 of 2) 02/27/2022*   Tetanus Vaccine  10/02/2022*   Hemoglobin A1C  02/27/2022   HPV Vaccine  Aged Out  *Topic was postponed. The date shown is not the original due date.    Advanced directives: No  Conditions/risks identified: None  Next appointment: Follow up in one year for your annual wellness visit     Preventive Care 65 Years and Older, Female Preventive care refers to lifestyle choices and visits with your health care provider that can promote health and wellness. What does preventive care include? A yearly physical exam. This is also called an annual well check. Dental exams once or twice a year. Routine eye exams. Ask your health care provider how often you should have your eyes checked. Personal lifestyle choices, including: Daily care of your teeth and gums. Regular physical activity. Eating a healthy diet. Avoiding tobacco and drug use. Limiting alcohol use. Practicing safe sex. Taking low-dose aspirin every day. Taking vitamin and mineral supplements as recommended by your health care provider. What happens during an annual well check? The services and screenings done by your health care provider  during your annual well check will depend on your age, overall health, lifestyle risk factors, and family history of disease. Counseling  Your health care provider may ask you questions about your: Alcohol use. Tobacco use. Drug use. Emotional well-being. Home and relationship well-being. Sexual activity. Eating habits. History of falls. Memory and ability to understand (cognition). Work and work Statistician. Reproductive health. Screening  You may have the following tests or measurements: Height, weight, and BMI. Blood pressure. Lipid and cholesterol levels. These may be checked every 5 years, or more frequently if you are over 62 years old. Skin check. Lung cancer screening. You may have this screening every year starting at age 39 if you have a 30-pack-year history of smoking and currently smoke or have quit within the past 15 years. Fecal occult blood test (FOBT) of the stool. You may have this test every year starting at age 70. Flexible sigmoidoscopy or colonoscopy. You may have a sigmoidoscopy every 5 years or a colonoscopy every 10 years starting at age 14. Hepatitis C blood test. Hepatitis B blood test. Sexually transmitted disease (STD) testing. Diabetes screening. This is done by checking your blood sugar (glucose) after you have not eaten for a while (fasting). You may have this done every 1-3 years. Bone density scan. This is done to screen for osteoporosis. You may have this done starting at age 12. Mammogram. This may be done every 1-2 years. Talk to your health care provider about how often you should have regular mammograms. Talk with your health care provider about your test results, treatment options, and if necessary, the need for more tests. Vaccines  Your health care provider may recommend certain vaccines, such as: Influenza vaccine. This is recommended every year. Tetanus, diphtheria, and acellular pertussis (Tdap, Td) vaccine. You may need a Td booster every  10 years. Zoster vaccine. You may need this after age 74. Pneumococcal 13-valent conjugate (PCV13) vaccine. One dose is recommended after age 74. Pneumococcal polysaccharide (PPSV23) vaccine. One dose is recommended after age 21. Talk to your health care provider about which screenings and vaccines you need and how often you need them. This information is not intended to replace advice given to you by your  health care provider. Make sure you discuss any questions you have with your health care provider. Document Released: 02/23/2015 Document Revised: 10/17/2015 Document Reviewed: 11/28/2014 Elsevier Interactive Patient Education  2017 [place] Prevention in the Home Falls can cause injuries. They can happen to people of all ages. There are many things you can do to make your home safe and to help prevent falls. What can I do on the outside of my home? Regularly fix the edges of walkways and driveways and fix any cracks. Remove anything that might make you trip as you walk through a door, such as a raised step or threshold. Trim any bushes or trees on the path to your home. Use bright outdoor lighting. Clear any walking paths of anything that might make someone trip, such as rocks or tools. Regularly check to see if handrails are loose or broken. Make sure that both sides of any steps have handrails. Any raised decks and porches should have guardrails on the edges. Have any leaves, snow, or ice cleared regularly. Use sand or salt on walking paths during winter. Clean up any spills in your garage right away. This includes oil or grease spills. What can I do in the bathroom? Use night lights. Install grab bars by the toilet and in the tub and shower. Do not use towel bars as grab bars. Use non-skid mats or decals in the tub or shower. If you need to sit down in the shower, use a plastic, non-slip stool. Keep the floor dry. Clean up any water that spills on the floor as soon as it  happens. Remove soap buildup in the tub or shower regularly. Attach bath mats securely with double-sided non-slip rug tape. Do not have throw rugs and other things on the floor that can make you trip. What can I do in the bedroom? Use night lights. Make sure that you have a light by your bed that is easy to reach. Do not use any sheets or blankets that are too big for your bed. They should not hang down onto the floor. Have a firm chair that has side arms. You can use this for support while you get dressed. Do not have throw rugs and other things on the floor that can make you trip. What can I do in the kitchen? Clean up any spills right away. Avoid walking on wet floors. Keep items that you use a lot in easy-to-reach places. If you need to reach something above you, use a strong step stool that has a grab bar. Keep electrical cords out of the way. Do not use floor polish or wax that makes floors slippery. If you must use wax, use non-skid floor wax. Do not have throw rugs and other things on the floor that can make you trip. What can I do with my stairs? Do not leave any items on the stairs. Make sure that there are handrails on both sides of the stairs and use them. Fix handrails that are broken or loose. Make sure that handrails are as long as the stairways. Check any carpeting to make sure that it is firmly attached to the stairs. Fix any carpet that is loose or worn. Avoid having throw rugs at the top or bottom of the stairs. If you do have throw rugs, attach them to the floor with carpet tape. Make sure that you have a light switch at the top of the stairs and the bottom of the stairs. If you do not  have them, ask someone to add them for you. What else can I do to help prevent falls? Wear shoes that: Do not have high heels. Have rubber bottoms. Are comfortable and fit you well. Are closed at the toe. Do not wear sandals. If you use a stepladder: Make sure that it is fully opened.  Do not climb a closed stepladder. Make sure that both sides of the stepladder are locked into place. Ask someone to hold it for you, if possible. Clearly mark and make sure that you can see: Any grab bars or handrails. First and last steps. Where the edge of each step is. Use tools that help you move around (mobility aids) if they are needed. These include: Canes. Walkers. Scooters. Crutches. Turn on the lights when you go into a dark area. Replace any light bulbs as soon as they burn out. Set up your furniture so you have a clear path. Avoid moving your furniture around. If any of your floors are uneven, fix them. If there are any pets around you, be aware of where they are. Review your medicines with your doctor. Some medicines can make you feel dizzy. This can increase your chance of falling. Ask your doctor what other things that you can do to help prevent falls. This information is not intended to replace advice given to you by your health care provider. Make sure you discuss any questions you have with your health care provider. Document Released: 11/23/2008 Document Revised: 07/05/2015 Document Reviewed: 03/03/2014 Elsevier Interactive Patient Education  2017 [employer].

## 2021-10-04 ENCOUNTER — Telehealth: Payer: Self-pay | Admitting: Pharmacist

## 2021-10-04 NOTE — Telephone Encounter (Signed)
Called to follow up on blood sugar readings and scheduling DEXA scan. Unable to leave a voicemail. Will try again later.

## 2021-10-04 NOTE — Telephone Encounter (Signed)
Called patient to follow up on blood sugar readings readings. Patient is injecting 16 units of Tresiba right now and stopped increasing her dose. She reported fasting blood sugars of: 109, 130, 172, 176, 201, 168, 189, 191, 134; post prandial 2 hours after lunch: 294, 280, and still over 300, and then bedtime: 209, 206, 291, 250, 342  Recommended continuing to titrate even if just by 1 unit every few days to get consistent fasting readings in the 90-130 range. Patient will continue to titrate.  Patient inquired about needing to stop metformin prior to her scan. Will route to PCP to inquire.

## 2021-10-10 DIAGNOSIS — E1169 Type 2 diabetes mellitus with other specified complication: Secondary | ICD-10-CM

## 2021-10-10 DIAGNOSIS — Z7984 Long term (current) use of oral hypoglycemic drugs: Secondary | ICD-10-CM

## 2021-10-10 DIAGNOSIS — E785 Hyperlipidemia, unspecified: Secondary | ICD-10-CM

## 2021-10-10 DIAGNOSIS — I10 Essential (primary) hypertension: Secondary | ICD-10-CM | POA: Diagnosis not present

## 2021-10-16 NOTE — Telephone Encounter (Signed)
Tried to call patient about metformin recommendations but unable to reach and unable to leave a voicemail. Will try again later.

## 2021-10-16 NOTE — Telephone Encounter (Signed)
Called patient back about metformin recommendations. She is aware to hold the metformin for 48 hours after the CT scan.

## 2021-10-25 ENCOUNTER — Ambulatory Visit
Admission: RE | Admit: 2021-10-25 | Discharge: 2021-10-25 | Disposition: A | Discharge status: Home / Self Care | Payer: Medicare HMO | Source: Ambulatory Visit | Attending: Internal Medicine | Admitting: Internal Medicine

## 2021-10-25 DIAGNOSIS — K8020 Calculus of gallbladder without cholecystitis without obstruction: Secondary | ICD-10-CM | POA: Diagnosis not present

## 2021-10-25 DIAGNOSIS — R6881 Early satiety: Secondary | ICD-10-CM | POA: Diagnosis not present

## 2021-10-25 DIAGNOSIS — R634 Abnormal weight loss: Secondary | ICD-10-CM | POA: Diagnosis not present

## 2021-10-25 DIAGNOSIS — K3189 Other diseases of stomach and duodenum: Secondary | ICD-10-CM | POA: Diagnosis not present

## 2021-10-25 DIAGNOSIS — R197 Diarrhea, unspecified: Secondary | ICD-10-CM | POA: Diagnosis not present

## 2021-10-25 DIAGNOSIS — I700 Atherosclerosis of aorta: Secondary | ICD-10-CM | POA: Diagnosis not present

## 2021-10-25 DIAGNOSIS — N289 Disorder of kidney and ureter, unspecified: Secondary | ICD-10-CM | POA: Diagnosis not present

## 2021-10-25 MED ORDER — IOPAMIDOL (ISOVUE-300) INJECTION 61%
100.0000 mL | Freq: Once | INTRAVENOUS | Status: AC | PRN
  Administered 2021-10-25: 100 mL via INTRAVENOUS

## 2021-10-27 NOTE — Progress Notes (Signed)
Ct scan shows gall stones  and  distended stomach  poss slow gastric emptying . No other significant sx that would explain weigh t loss  Need a GI evaluation and should consider stopping the trucliciy that could be adding to the stomach sx . Please make GI referral asap if not already done.  And plan follow up.

## 2021-10-28 NOTE — Progress Notes (Signed)
Thanks

## 2021-11-06 ENCOUNTER — Other Ambulatory Visit: Payer: Self-pay | Admitting: Internal Medicine

## 2021-11-12 ENCOUNTER — Telehealth: Payer: Self-pay | Admitting: Internal Medicine

## 2021-11-12 ENCOUNTER — Telehealth: Payer: Self-pay | Admitting: Pharmacist

## 2021-11-12 ENCOUNTER — Other Ambulatory Visit: Payer: Self-pay | Admitting: *Deleted

## 2021-11-12 ENCOUNTER — Other Ambulatory Visit: Payer: Self-pay | Admitting: Internal Medicine

## 2021-11-12 MED ORDER — ACCU-CHEK GUIDE VI STRP: 1.0000 | ORAL_STRIP | Freq: Every day | 6 refills | Status: DC

## 2021-11-12 MED ORDER — ACCU-CHEK SOFTCLIX LANCETS MISC: 1.0000 | Freq: Every day | 6 refills | Status: DC

## 2021-11-12 NOTE — Chronic Care Management (AMB) (Unsigned)
Chronic Care Management Pharmacy Assistant   Name: [name] [name]  MRN: [id number] DOB: [birth year]  Reason for Encounter: Medication Review Medication Coordination Call  Recent office visits:  10/01/2021 [name] Arbour LPN - Medicare annual wellness exam  Recent consult visits:  None  Hospital visits:  None  Medications: Outpatient Encounter Medications as of 11/12/2021  Medication Sig Note   Accu-Chek Softclix Lancets lancets Use as instructed    atorvastatin (LIPITOR) 40 MG tablet Take 1 tablet (40 mg total) by mouth daily.    Blood Glucose Monitoring Suppl (ACCU-CHEK GUIDE) w/Device KIT Daily use    Cholecalciferol (VITAMIN D) 125 MCG (5000 UT) CAPS Take 50,000 Units by mouth once a week. (Patient not taking: Reported on 09/18/2021)    Dulaglutide (TRULICITY) 3.29 JJ/8.8CZ SOPN Inject 0.75 mg into the skin once a week. (Patient not taking: Reported on 09/18/2021) 08/27/2021: Taking infrequently   FREESTYLE UNISTICK II LANCETS MISC Use to check blood sugar 1-2 times daily    glucose blood (ACCU-CHEK GUIDE) test strip Use 1-2 times daily    glucose blood (FREESTYLE LITE) test strip Use as instructed    Glucose Blood (FREESTYLE TEST VI) by In Vitro route.    glucose blood test strip Use to check blood sugar 1-2 times daily    insulin degludec (TRESIBA FLEXTOUCH) 100 UNIT/ML FlexTouch Pen Inject 10 Units into the skin daily.    Insulin Pen Needle (CAREFINE PEN NEEDLES) 32G X 4 MM MISC Use to inject insulin daily    losartan (COZAAR) 50 MG tablet Take 1 tablet (50 mg total) by mouth daily. (Patient not taking: Reported on 09/18/2021)    metFORMIN (GLUCOPHAGE) 1000 MG tablet TAKE 1 TABLET BY MOUTH 2 TIMES DAILY WITH A MEAL.    No facility-administered encounter medications on file as of 11/12/2021.   Reviewed chart for medication changes ahead of medication coordination call.  BP Readings from Last 3 Encounters:  09/18/21 118/64  08/27/21 (!) 150/86  08/27/21 108/70    Lab Results   Component Value Date   HGBA1C 11.1 (A) 08/27/2021     Patient obtains medications through Adherence Packaging  30 Days   Last adherence delivery included: onboarding  Patient declined last month: onboarding   Patient is due for next adherence delivery on: 11/22/2021  Called patient and reviewed medications and coordinated delivery. SCHED FU This delivery to include: Atorvastatin 40 mg 1 tablet with dinner Tresiba flex 100 units inject 10 units daily Carefine pen needles 32g x 29m use daily with insulin  Coordinated acute fill:   Patient declined the following medications:  Metformin 1000 mg 1 Tablet with lunch and dinner - this was filled for 90 DS at [facility] on 11/06/2021  Confirmed delivery date of 11/22/2021, advised patient that pharmacy will contact them the morning of delivery.   Care Gaps: AWV - scheduled 10/03/2022 Last BP - 108/70 on 08/27/2021 Last A1C - 11.1 on 08/27/2021 Hep C Screen - never done Foot exam - overdue Urine ACR - overdue Flu - due Pneumonia vaccine - postponed Mammogram - postponed Eye exam - postponed Dexa scan - postponed Colonoscopy - postponed  Star Rating Drugs: Atorvastatin 40 mg - last filled 09/19/2021 64 DS at [facility] Losartan 50 mg - last filled 04/17/2021 [facility] verified with [name] Metformin 1000 mg - last filled 11/06/2021 [facility] verified with LDenny PeonTrulicity 16.6AY/3.0ZS- last filled 04/17/2021 [facility] verified with LTruroPharmacist  Assistant [phone]

## 2021-11-12 NOTE — Telephone Encounter (Signed)
Pt is calling and would like ct chest abd pelvis results she had ct on 10-25-2021

## 2021-11-13 ENCOUNTER — Other Ambulatory Visit: Payer: Self-pay

## 2021-11-13 DIAGNOSIS — R634 Abnormal weight loss: Secondary | ICD-10-CM

## 2021-11-13 NOTE — Telephone Encounter (Signed)
Call patient. Voicemail has not been set up yet.

## 2021-11-14 NOTE — Addendum Note (Signed)
Addended by: Viona Gilmore on: 11/14/2021 11:59 AM   Modules accepted: Orders

## 2021-11-14 NOTE — Progress Notes (Signed)
Called [facility] and cancelled patients prescriptions for Metformin and Carefine Pen Needles.

## 2021-11-15 ENCOUNTER — Ambulatory Visit (INDEPENDENT_AMBULATORY_CARE_PROVIDER_SITE_OTHER)
Admission: RE | Admit: 2021-11-15 | Discharge: 2021-11-15 | Disposition: A | Discharge status: Home / Self Care | Payer: Medicare HMO | Source: Ambulatory Visit | Attending: Internal Medicine | Admitting: Internal Medicine

## 2021-11-15 DIAGNOSIS — R634 Abnormal weight loss: Secondary | ICD-10-CM

## 2021-11-15 DIAGNOSIS — E2839 Other primary ovarian failure: Secondary | ICD-10-CM

## 2021-11-18 NOTE — Telephone Encounter (Signed)
[name] [name], [name] [name]  11/13/2021  5:15 PM EDT     Spoke to patient lab result below. Verbalized understanding. GI referral sent.

## 2021-11-21 ENCOUNTER — Telehealth: Payer: Self-pay | Admitting: Pharmacist

## 2021-11-21 NOTE — Chronic Care Management (AMB) (Signed)
    Chronic Care Management Pharmacy Assistant   Name: Ardeth Repetto  MRN: [id number] DOB: 1950/03/09  Reason for Encounter: Follow up Gaps for eye exam Spoke with patient, she states it has been over a year since her last eye exam, she plans to schedule this in the near future.   [place] Pharmacist Assistant [phone]

## 2021-11-23 NOTE — Progress Notes (Signed)
Bone density  shows t score -3.5  C/W osteoporosis . I am awaiting for her to see the Gi team for the weight loss and stomach findings  on ct scan ( please facilitate  Gi referral in system.    advice fu visit  virtual ok  to  review findings   and help decide on best next step  about her bone health   May benefit from endo consult .

## 2021-11-27 ENCOUNTER — Telehealth: Payer: Self-pay | Admitting: Pharmacist

## 2021-11-27 NOTE — Progress Notes (Signed)
Sure let her know  we may need to have endo  bisphosphonate  may not be best with her gi sx  thanks

## 2021-11-27 NOTE — Chronic Care Management (AMB) (Unsigned)
    Chronic Care Management Pharmacy Assistant   Name: Audrionna Lampton  MRN: [id number] DOB: 12/11/1950  11/29/2021 APPOINTMENT REMINDER  Called [name], No answer, voice mail not set up, unable to leave message of appointment on 11/29/2021 at 1:00 via telephone visit with Jeni Salles, Pharm D.   Care Gaps: AWV - scheduled 10/03/2022 Last BP - 108/70 on 08/27/2021 Last A1C - 11.1 on 08/27/2021 Hep C Screen - never done Foot exam - overdue Urine ACR - overdue Flu - overdue Pneumonia - postponed Mammogram - postponed Eye exam - postponed Colonoscopy - postponed Covid - postponed Tdap - postponed  Star Rating Drug: Atorvastatin 40 mg - last filled 09/19/2021 64 DS at [facility] Losartan 50 mg - last filled 04/17/2021 [facility] verified with Gerald Stabs Metformin 1000 mg - last filled 11/06/2021 [facility]   Any gaps in medications fill history? Yes  [place] Pharmacist Assistant [phone]

## 2021-11-28 NOTE — Progress Notes (Signed)
Chronic Care Management Pharmacy Note  11/29/2021 Name:  [name] [name] MRN:  [id number] DOB:  09-24-1950  Summary: A1c not at goal < 7% Fasting blood sugars are at goal  Pt was just diagnosed with osteoporosis  Recommendations/Changes made from today's visit: -Recommended addition of Fiasp for dinner time or consider an SGLT2 for post prandial glucose lowering -Recommended bisphosphonate or Prolia for osteoporosis -Recommended calcium citrate 500-600 mg daily in addition to current dietary intake  Plan: Follow up in office for Dexcom instructions  Subjective: [name] [name] is an 71 y.o. year old female who is a primary patient of Panosh, Standley Brooking, MD.  The CCM team was consulted for assistance with disease management and care coordination needs.    Engaged with patient by telephone for follow up visit in response to provider referral for pharmacy case management and/or care coordination services.   Consent to Services:  The patient was given information about Chronic Care Management services, agreed to services, and gave verbal consent prior to initiation of services.  Please see initial visit note for detailed documentation.   Patient Care Team: Panosh, Standley Brooking, MD as PCP - General Haygood, Seymour Bars, MD (Inactive) as Attending Physician (Obstetrics and Gynecology) Viona Gilmore, [facility] as Pharmacist (Pharmacist)  Recent office visits: 08/27/2021 [name] MD - Patient was seen for Type 2 diabetes mellitus with hyperglycemia, without long-term current use of insulin and additional issues. Discontinued Glimepiride, Pioglitazone and Valsartan. Prescribed vitamin D 50,000 units once weekly x 12 weeks.  Recent consult visits: None in previous 6 months  Hospital visits: Patient was seen at [facility] ED on 08/27/2021 (5 hours) due to Hyperglycemia.    New?Medications Started at [facility] Discharge:?? No medications started Medication Changes at  Hospital Discharge: No medications changed Medications Discontinued at Hospital Discharge: No medications discontinued Medications that remain the same after Hospital Discharge:??  -All other medications will remain the same.     Objective:  Lab Results  Component Value Date   CREATININE 0.55 08/27/2021   BUN 12 08/27/2021   GFR 92.82 08/27/2021   GFRNONAA >60 08/27/2021   GFRAA 96 02/23/2007   NA 134 (L) 08/27/2021   K 4.1 08/27/2021   CALCIUM 9.1 08/27/2021   CO2 22 08/27/2021   GLUCOSE 337 (H) 08/27/2021    Lab Results  Component Value Date/Time   HGBA1C 11.1 (A) 08/27/2021 11:39 AM   HGBA1C 11.0 (H) 11/05/2020 11:16 AM   HGBA1C 12.3 (H) 06/20/2020 09:39 AM   GFR 92.82 08/27/2021 12:13 PM   GFR 92.96 11/05/2020 11:16 AM   MICROALBUR <0.7 06/20/2020 09:39 AM   MICROALBUR <0.7 01/04/2019 10:05 AM    Last diabetic Eye exam:  Lab Results  Component Value Date/Time   HMDIABEYEEXA No Retinopathy 10/01/2016 12:00 AM    Last diabetic Foot exam:  Lab Results  Component Value Date/Time   HMDIABFOOTEX Done 05/25/2009 12:00 AM     Lab Results  Component Value Date   CHOL 211 (H) 08/27/2021   HDL 72.30 08/27/2021   LDLCALC 73 10/09/2011   LDLDIRECT 105.0 08/27/2021   TRIG 277.0 (H) 08/27/2021   CHOLHDL 3 08/27/2021       Latest Ref Rng & Units 08/27/2021   12:13 PM 11/05/2020   11:16 AM 06/20/2020    9:39 AM  Hepatic Function  Total Protein 6.0 - 8.3 g/dL 6.1  6.0  6.1   Albumin 3.5 - 5.2 g/dL 3.9  3.9  3.9   AST 0 -  37 U/L _0 ALT 0 - 35 U/L _1 Alk Phosphatase 39 - 117 U/L 56  54  75   Total Bilirubin 0.2 - 1.2 mg/dL 0.6  0.8  0.8   Bilirubin, Direct 0.0 - 0.3 mg/dL 0.1  0.1  0.1     Lab Results  Component Value Date/Time   TSH 1.58 08/27/2021 12:13 PM   TSH 1.61 06/20/2020 09:39 AM   FREET4 1.07 08/27/2021 12:13 PM   FREET4 1.04 06/20/2020 09:39 AM       Latest Ref Rng & Units 08/27/2021    5:53 PM 08/27/2021   12:13 PM 06/20/2020     9:39 AM  CBC  WBC 4.0 - 10.5 K/uL 6.0  5.7  6.2   Hemoglobin 12.0 - 15.0 g/dL 15.7  14.9  15.8   Hematocrit 36.0 - 46.0 % 44.2  43.8  45.0   Platelets 150 - 400 K/uL 328  344.0  321.0     Lab Results  Component Value Date/Time   VD25OH 13.16 (L) 08/27/2021 12:13 PM   VD25OH 44 03/27/2009 08:11 PM   VD25OH 22 (L) 09/05/2008 09:48 PM    Clinical ASCVD: No  The ASCVD Risk score (Arnett DK, et al., 2019) failed to calculate for the following reasons:   The systolic blood pressure is missing       10/01/2021   12:54 PM 08/27/2021   11:34 AM 11/05/2020   11:15 AM  Depression screen PHQ 2/9  Decreased Interest 0 0 0  Down, Depressed, Hopeless 0 0 0  PHQ - 2 Score 0 0 0  Altered sleeping 0 2 2  Tired, decreased energy 0 2 2  Change in appetite 0 0 1  Feeling bad or failure about yourself  0 0 0  Trouble concentrating 0 0 0  Moving slowly or fidgety/restless 0 0 0  Suicidal thoughts 0 0 0  PHQ-9 Score 0 4 5  Difficult doing work/chores Not difficult at all Not difficult at all Somewhat difficult      Social History   Tobacco Use  Smoking Status Never  Smokeless Tobacco Never   BP Readings from Last 3 Encounters:  09/18/21 118/64  08/27/21 (!) 150/86  08/27/21 108/70   Pulse Readings from Last 3 Encounters:  08/27/21 96  08/27/21 (!) 120  11/05/20 (!) 112   Wt Readings from Last 3 Encounters:  10/01/21 107 lb (48.5 kg)  08/27/21 107 lb 9.6 oz (48.8 kg)  11/05/20 127 lb 12.8 oz (58 kg)   BMI Readings from Last 3 Encounters:  10/01/21 18.37 kg/m  08/27/21 18.47 kg/m  11/05/20 21.94 kg/m    Assessment/Interventions: Review of patient past medical history, allergies, medications, health status, including review of consultants reports, laboratory and other test data, was performed as part of comprehensive evaluation and provision of chronic care management services.   SDOH:  (Social Determinants of Health) assessments and interventions performed: Yes (last  10/01/21) SDOH Interventions    Flowsheet Row Clinical Support from 10/01/2021 in [place] at [place] Management from 09/18/2021 in [place] at [place] Interventions Intervention Not Indicated --  Housing Interventions Intervention Not Indicated --  Transportation Interventions Intervention Not Indicated Intervention Not Indicated  Financial Strain Interventions Intervention Not Indicated Intervention Not Indicated  Physical Activity Interventions Intervention Not Indicated --  Stress Interventions Intervention Not Indicated --  Social Connections Interventions  Intervention Not Indicated --      SDOH Screenings   Food Insecurity: No Food Insecurity (10/01/2021)  Housing: Low Risk  (10/01/2021)  Transportation Needs: No Transportation Needs (10/01/2021)  Alcohol Screen: Low Risk  (10/01/2021)  Depression (PHQ2-9): Low Risk  (10/01/2021)  Financial Resource Strain: Low Risk  (10/01/2021)  Physical Activity: Inactive (10/01/2021)  Social Connections: Socially Isolated (10/01/2021)  Stress: No Stress Concern Present (10/01/2021)  Tobacco Use: Low Risk  (10/01/2021)     CCM Care Plan  Allergies  Allergen Reactions   Naproxen     REACTION: Dizziness and nausea, slowed down pt's reaction time   Simvastatin     Joint Pain    Medications Reviewed Today     Reviewed by Viona Gilmore, [facility] (Pharmacist) on 11/29/21 at 1326  Med List Status: <None>   Medication Order Taking? Sig Documenting Provider Last Dose Status Informant  Accu-Chek Softclix Lancets lancets [id number]  1 each by Other route daily. Dx E11.9 Burnis Medin, MD  Active   atorvastatin (LIPITOR) 40 MG tablet [id number]  TAKE ONE TABLET BY MOUTH ONCE DAILY Panosh, Standley Brooking, MD  Active   Blood Glucose Monitoring Suppl (ACCU-CHEK GUIDE) w/Device KIT [id number] No Daily use Panosh, Standley Brooking, MD Taking Active   Cholecalciferol (VITAMIN D) 125 MCG (5000 UT)  CAPS [id number] No Take 50,000 Units by mouth once a week.  Patient not taking: Reported on 09/18/2021   Burnis Medin, MD Not Taking Active   FREESTYLE Malcolm II LANCETS MISC [id number] No Use to check blood sugar 1-2 times daily Philemon Kingdom, MD Taking Active   glucose blood (ACCU-CHEK GUIDE) test strip [id number]  1 each by Other route daily. Dx E11.9 Burnis Medin, MD  Active   glucose blood test strip [id number] No Use to check blood sugar 1-2 times daily Panosh, Standley Brooking, MD Taking Active   insulin degludec (TRESIBA FLEXTOUCH) 100 UNIT/ML FlexTouch Pen [id number] No Inject 10 Units into the skin daily. Panosh, Standley Brooking, MD Taking Active   Insulin Pen Needle (CAREFINE PEN NEEDLES) 32G X 4 MM MISC [id number]  Use to inject insulin daily Panosh, Standley Brooking, MD  Active   losartan (COZAAR) 50 MG tablet [id number] No Take 1 tablet (50 mg total) by mouth daily.  Patient not taking: Reported on 09/18/2021   Burnis Medin, MD Not Taking Active   metFORMIN (GLUCOPHAGE) 1000 MG tablet [id number]  TAKE 1 TABLET BY MOUTH 2 TIMES DAILY WITH A MEAL. Panosh, Standley Brooking, MD  Active             Patient Active Problem List   Diagnosis Date Noted   Type 2 diabetes mellitus with hyperglycemia, without long-term current use of insulin ([place]) 03/03/2016   Lump in neck 07/28/2014   Pain in shoulder region 07/28/2014   Jaw pain, non-TMJ 10/06/2013   Leg pain, lateral 06/18/2012   Skin lesion 01/15/2012   IBS (irritable bowel syndrome) 10/07/2011   Breast nodule 08/11/2011   Intentional underdosing of medication regimen by patient due to financial hardship 05/30/2011   Stress at home 05/30/2011   Breast tenderness in female 01/10/2011   Contact dermatitis and eczema due to plant 09/12/2010   NON ADHERENCE  TO MEDICATION 01/21/2010   OBESITY 09/10/2009   PLANTAR FASCIITIS, BILATERAL 05/25/2009   CHOLELITHIASIS 10/09/2008   SCIATICA 10/09/2008   THYROID FUNCTION TEST, ABNORMAL 10/09/2008   IBS 07/12/2008    LEG PAIN, LEFT 07/12/2008   SHOULDER PAIN, LEFT 07/07/2007  HIP PAIN, LEFT 07/07/2007   Vitamin D deficiency 03/22/2007   Hyperlipidemia 01/12/2007   Essential hypertension 01/12/2007   ALLERGIC RHINITIS 01/12/2007   MUSCULOSKELETAL PAIN 01/12/2007    Immunization History  Administered Date(s) Administered   Fluad Quad(high Dose 65+) 11/30/2018, 11/05/2020   H1N1 04/28/2008   Influenza Split 01/10/2011, 01/15/2012   Influenza Whole 01/12/2007, 05/11/2008, 01/21/2010   Influenza, High Dose Seasonal PF 12/08/2017   Influenza,inj,Quad PF,6+ Mos 10/19/2012, 10/06/2013, 03/22/2015, 01/18/2016, 12/07/2016   Influenza-Unspecified 10/19/2012, 10/06/2013, 03/22/2015, 01/18/2016, 12/07/2016   PFIZER(Purple Top)SARS-COV-2 Vaccination 05/24/2019, 06/15/2019   Pneumococcal Polysaccharide-23 01/21/2010   Td 02/10/2005   Patient has remained on Tresiba 22 units daily and has seen good readings for fasting blood sugars but they have been very elevated for post prandial readings. She is still only eating 2 meals a day.  Patient inquired about the results of her DEXA scan. Informed her of the osteoporosis diagnosis and discussed treatment options. Patient does not want to do another injection if she doesn't have to.   Patient doesn't consume a lot of dietary calcium. She drinks milk sometimes and is not a big cheese or yogurt person and tries to limit her ice cream intake. She loves cashews and almonds and doesn't eat beans very often. Recommended 1 serving of dairy per day in addition to supplementation.  Patient plans to get back into walking.   Patient also inquired about where she can donate old glucometers.   Conditions to be addressed/monitored:  Hypertension, Hyperlipidemia, Diabetes, and Vitamin D deficiency  Conditions addressed this visit: Diabetes, osteoporosis    Care Plan : Pharmacy Care Plan  Updates made by Viona Gilmore, [place] since 11/29/2021 12:00 AM     Problem: Problem:  Hypertension, Hyperlipidemia, Diabetes, and Vitamin D deficiency      Long-Range Goal: Patient-Specific Goal   Start Date: 09/18/2021  Expected End Date: 09/19/2022  Recent Progress: On track  Priority: High  Note:   Current Barriers:  Unable to independently monitor therapeutic efficacy Unable to achieve control of diabetes  Unable to maintain control of cholesterol Unable to self administer medications as prescribed  Pharmacist Clinical Goal(s):  Patient will achieve adherence to monitoring guidelines and medication adherence to achieve therapeutic efficacy achieve control of diabetes as evidenced by A1c maintain control of cholesterol as evidenced by next lipid panel  through collaboration with PharmD and provider.   Interventions: 1:1 collaboration with Panosh, Standley Brooking, MD regarding development and update of comprehensive plan of care as evidenced by provider attestation and co-signature Inter-disciplinary care team collaboration (see longitudinal plan of care) Comprehensive medication review performed; medication list updated in electronic medical record  Hypertension (BP goal <130/80) -Controlled -Current treatment: No medications -Medications previously tried: losartan  -Current home readings: does not check at home; does not own a BP cuff yet but plans to purchase one -Current dietary habits: cooks with very little salt, ocasional TV dinner; soup now and then; looking at sodium content -Current exercise habits: no structured exercise; worried about falling -Denies hypotensive/hypertensive symptoms -Educated on BP goals and benefits of medications for prevention of heart attack, stroke and kidney damage; Importance of home blood pressure monitoring; Proper BP monitoring technique; Symptoms of hypotension and importance of maintaining adequate hydration; -Counseled to monitor BP at home a few times a week, document, and provide log at future appointments -Counseled on diet  and exercise extensively Recommended purchasing a BP cuff and start to monitor at home.  Hyperlipidemia: (LDL goal < 70, TGs <  150) -Uncontrolled -Current treatment: Atorvastatin 40 mg 1 tablet daily - Appropriate, Query effective, Safe, Accessible -Medications previously tried: none  -Current dietary patterns: doesn't eat much fried foods -Current exercise habits: no structured exercise -Educated on Cholesterol goals;  Benefits of statin for ASCVD risk reduction; Importance of limiting foods high in cholesterol; Exercise goal of 150 minutes per week; -Counseled on diet and exercise extensively Recommended to continue current medication Recommended repeat lipid panel in 3 months after compliant with treatment.  Diabetes (A1c goal <7%) -Uncontrolled -Current medications: Tresiba inject 22 units into the skin daily  - Appropriate, Query effective, Safe, Accessible Metformin 1000 mg 1 tablet twice daily - Appropriate, Query effective, Safe, Accessible -Medications previously tried: Trulicity (side effects) -Current home glucose readings fasting glucose: highest was 404, lowest was 153, 238, 229, 241, 231, 223, 238 post prandial glucose: 285, 339, 271, 286, 294, 268, 346 -Reports hypoglycemic/hyperglycemic symptoms -Current meal patterns: sometimes 2 meals a day; appetite varies breakfast: skips  lunch: mini subs, frozen pizza, sometimes sandwiches dinner: stir fry, baked chicken & spaghetti, pork chops snacks: was eating more snacks with higher blood sugars; nuts more now; was eating more desserts, craves sugar  drinks: water with flavors (3 cups a day), diet drinks -Current exercise: no structured exercise right now -Educated on A1c and blood sugar goals; Complications of diabetes including kidney damage, retinal damage, and cardiovascular disease; Exercise goal of 150 minutes per week; Benefits of routine self-monitoring of blood sugar; Continuous glucose  monitoring; Carbohydrate counting and/or plate method -Counseled to check feet daily and get yearly eye exams -Provided healthy plate handout. -Counseled on diet and exercise extensively Recommended increasing Tresiba by 2 units every 2-3 days to target fasting blood sugar < 130. Recommended starting on Dexcom CGM and submitted order.  Vitamin D deficiency (Goal: 30-100) -Uncontrolled -Current treatment  Vitamin D 50,000 units 1 capsule weekly - did not pick up yet -Medications previously tried: none  -Counseled on importance of taking vitamin D with a meal. Recommended DEXA scan.  Health Maintenance -Vaccine gaps: influenza, shingrix, tetanus, Prevnar20 -Current therapy:  No medications -Educated on Cost vs benefit of each product must be carefully weighed by individual consumer -Patient is satisfied with current therapy and denies issues -Recommended to continue as is  Patient Goals/Self-Care Activities Patient will:  - take medications as prescribed as evidenced by patient report and record review focus on medication adherence by using a pill box or adherence packaging. check glucose a few times a day, document, and provide at future appointments check blood pressure weekly, document, and provide at future appointments target a minimum of 150 minutes of moderate intensity exercise weekly  Follow Up Plan: The care management team will reach out to the patient again over the next 7 days.        Medication Assistance: None required.  Patient affirms current coverage meets needs.   Compliance/Adherence/Medication fill history: Care Gaps: Hep C screening, tetanus, foot exam, influenza, Prevnar20, mammogram, eye exam, DEXA, colonscopy, COVID booster, shingrix, urine microalbumin  Last BP - 118/64 on 09/18/2021 Last A1C - 11.1 on 08/27/2021   Star-Rating Drugs: Atorvastatin 40 mg - last filled 09/19/2021 64 DS at [facility] Losartan 50 mg - last filled [facility]/17/2021 [facility]  verified with Gerald Stabs Metformin 1000 mg - last filled [facility]/06/2021 [facility]   Patient's preferred pharmacy is:  [facility], [place] - [address] [place] [address] Phone: [phone] Fax:  [phone]  [facility] - [place], [place] - [address] [place] [address] Phone: [phone] Fax: [phone]  Uses pill box? No - doesn't like to keep up with one Pt endorses 80% compliance - mostly missing doses of atorvastatin  We discussed: Benefits of medication synchronization, packaging and delivery as well as enhanced pharmacist oversight with [facility]. Patient decided to: Utilize [facility] pharmacy for medication synchronization, packaging and delivery  Care Plan and Follow Up Patient Decision:  Patient agrees to Care Plan and Follow-up.  Plan: The care management team will reach out to the patient again over the next 7 days.  Jeni Salles, PharmD, [place] Pharmacist [facility] [facility] [place]

## 2021-11-29 ENCOUNTER — Ambulatory Visit (INDEPENDENT_AMBULATORY_CARE_PROVIDER_SITE_OTHER): Payer: Medicare HMO | Admitting: Pharmacist

## 2021-11-29 DIAGNOSIS — E785 Hyperlipidemia, unspecified: Secondary | ICD-10-CM

## 2021-11-29 DIAGNOSIS — E1165 Type 2 diabetes mellitus with hyperglycemia: Secondary | ICD-10-CM

## 2021-12-04 ENCOUNTER — Telehealth: Payer: Self-pay | Admitting: Pharmacist

## 2021-12-04 ENCOUNTER — Encounter: Payer: Self-pay | Admitting: Nurse Practitioner

## 2021-12-04 NOTE — Chronic Care Management (AMB) (Signed)
    Chronic Care Management Pharmacy Assistant   Name: [name]  MRN: [id number] DOB: 1950/07/10  Reason for Encounter: Patient Assistance Application for [place] and [place].   Applications completed and in PAP folder to be completed with [place] Pharmacist Assistant [phone]

## 2021-12-09 ENCOUNTER — Telehealth (INDEPENDENT_AMBULATORY_CARE_PROVIDER_SITE_OTHER): Payer: Medicare HMO | Admitting: Internal Medicine

## 2021-12-09 ENCOUNTER — Encounter: Payer: Self-pay | Admitting: Internal Medicine

## 2021-12-09 VITALS — Ht 63.0 in | Wt 115.0 lb

## 2021-12-09 DIAGNOSIS — M810 Age-related osteoporosis without current pathological fracture: Secondary | ICD-10-CM | POA: Diagnosis not present

## 2021-12-09 DIAGNOSIS — H547 Unspecified visual loss: Secondary | ICD-10-CM

## 2021-12-09 DIAGNOSIS — E559 Vitamin D deficiency, unspecified: Secondary | ICD-10-CM

## 2021-12-09 DIAGNOSIS — E1365 Other specified diabetes mellitus with hyperglycemia: Secondary | ICD-10-CM | POA: Diagnosis not present

## 2021-12-09 DIAGNOSIS — Z79899 Other long term (current) drug therapy: Secondary | ICD-10-CM

## 2021-12-09 DIAGNOSIS — Z5982 Transportation insecurity: Secondary | ICD-10-CM | POA: Diagnosis not present

## 2021-12-09 DIAGNOSIS — L678 Other hair color and hair shaft abnormalities: Secondary | ICD-10-CM

## 2021-12-09 DIAGNOSIS — Z794 Long term (current) use of insulin: Secondary | ICD-10-CM

## 2021-12-09 DIAGNOSIS — R634 Abnormal weight loss: Secondary | ICD-10-CM

## 2021-12-09 MED ORDER — VITAMIN D 125 MCG (5000 UT) PO CAPS: 50000.0000 [IU] | ORAL_CAPSULE | ORAL | 0 refills | Status: DC

## 2021-12-09 NOTE — Progress Notes (Signed)
Virtual Visit via Telephone Note  I connected with   [name] [name] on 12/09/21 at  4:00 PM EDT by telephone and verified that I am speaking with the correct person using two identifiers.   I discussed the limitations, risks, security and privacy concerns of performing an evaluation and management service by telephone and the limited availability of in person appointments. tThere may be a patient responsible charge related to this service.  Location patient: home Location provider: work  office Participants present for the call: patient, provider Patient did not have a visit in the prior 7 days to address this/these issue(s).   History of Present Illness: [name] [name] presents for discussion of her bone density See notes.  Femoral neck -3.5 and -3.4 She had reported avoiding injectable and infusion medication. Taking hihg dose vit d   has one pill left .  No appt for labs  She is now on 22 units of Tresiba seen blood sugars in the 120 range however postprandial will go up to the 100.  Plan is to cover with mealtime insulin to get samples this week. Concern about hair being brittle for about a year what to do? Observations/Objective: Patient sounds personable and well on the phone. I do not appreciate any SOB. Speech and thought processing are grossly intact. Patient reported vitals: Weight has improved from 10 7-1 15 by history  Lab Results  Component Value Date   WBC 6.0 08/27/2021   HGB 15.7 (H) 08/27/2021   HCT 44.2 08/27/2021   PLT 328 08/27/2021   GLUCOSE 337 (H) 08/27/2021   CHOL 211 (H) 08/27/2021   TRIG 277.0 (H) 08/27/2021   HDL 72.30 08/27/2021   LDLDIRECT 105.0 08/27/2021   LDLCALC 73 10/09/2011   ALT 11 08/27/2021   AST 13 08/27/2021   NA 134 (L) 08/27/2021   K 4.1 08/27/2021   CL 104 08/27/2021   CREATININE 0.55 08/27/2021   BUN 12 08/27/2021   CO2 22 08/27/2021   TSH 1.58 08/27/2021   HGBA1C 11.1 (A) 08/27/2021   MICROALBUR <0.7 06/20/2020     Assessment and Plan: Osteoporosis without current pathological fracture, unspecified osteoporosis type  Other specified diabetes mellitus with hyperglycemia, with long-term current use of insulin ([place])  Vitamin D deficiency  Unintentional weight loss - improving slowly of of trulicity   Brittle hair - reported for a year  no obv other cause x rel;lated to dm weight loss other thyroid is in range. followattend to healthy nutrition  Poor vision - not driving  has cataracts , to ge retinal screen this week   Transportation insecurity - family member needs to drive her tpo medical appts   Medication management  I informed her of her GI appointment on December 4 and she should receive a letter.  Please keep that appointment I am hesitant to start an oral bisphosphonate until evaluation to avoid significant GI side effect complication. In interim weightbearing exercise such as walking safely we will stay on high-dose vitamin D for now Plan vitamin D levels at next blood draw. Due for A1c testing we will ask Maddie to do her order at her visit. With her poor vision she needs rides to come to her visits or medical appointments.  And her family member has to drive. Consider seeing dermatology if hair quality does not improve with metabolic improvement. Follow Up Instructions:  See above   [id number] 5-10 [id number] 11-20 [id number] 21-30 I did not refer this patient for an OV in the  next 24 hours for this/these issue(s).  I discussed the assessment and treatment plan with the patient. The patient was provided an opportunity to ask questions and answered. The patient agreed with the plan and demonstrated an understanding of the instructions.   The patient was advised to call back or seek an in-person evaluation if the symptoms worsen or if the condition fails to improve as anticipated.  I provided 19 minutes of non-face-to-face time during this encounter. Return for depending on results plan  after gi assessment .  [name], MD

## 2021-12-10 ENCOUNTER — Ambulatory Visit (INDEPENDENT_AMBULATORY_CARE_PROVIDER_SITE_OTHER): Payer: Medicare HMO

## 2021-12-10 ENCOUNTER — Telehealth: Payer: Self-pay | Admitting: Pharmacist

## 2021-12-10 DIAGNOSIS — I10 Essential (primary) hypertension: Secondary | ICD-10-CM | POA: Diagnosis not present

## 2021-12-10 DIAGNOSIS — Z794 Long term (current) use of insulin: Secondary | ICD-10-CM | POA: Diagnosis not present

## 2021-12-10 DIAGNOSIS — Z7985 Long-term (current) use of injectable non-insulin antidiabetic drugs: Secondary | ICD-10-CM | POA: Diagnosis not present

## 2021-12-10 DIAGNOSIS — E1365 Other specified diabetes mellitus with hyperglycemia: Secondary | ICD-10-CM

## 2021-12-10 DIAGNOSIS — E1165 Type 2 diabetes mellitus with hyperglycemia: Secondary | ICD-10-CM | POA: Diagnosis not present

## 2021-12-10 DIAGNOSIS — E785 Hyperlipidemia, unspecified: Secondary | ICD-10-CM | POA: Diagnosis not present

## 2021-12-10 DIAGNOSIS — E1159 Type 2 diabetes mellitus with other circulatory complications: Secondary | ICD-10-CM | POA: Diagnosis not present

## 2021-12-10 LAB — POCT GLYCOSYLATED HEMOGLOBIN (HGB A1C): Hemoglobin A1C: 8.6 % — AB (ref 4.0–5.6)

## 2021-12-10 NOTE — Telephone Encounter (Signed)
Patient stopped by for nurse visit for POC A1c check. Provided samples for Fiasp for her to start with 4 units before dinner. Updated her medication list to include this and current dose of Tyler Aas is 22 units daily.

## 2021-12-11 ENCOUNTER — Other Ambulatory Visit: Payer: Self-pay

## 2021-12-11 MED ORDER — FIASP FLEXTOUCH 100 UNIT/ML ~~LOC~~ SOPN: PEN_INJECTOR | SUBCUTANEOUS | 0 refills | Status: DC

## 2021-12-13 LAB — HM DIABETES EYE EXAM

## 2021-12-16 ENCOUNTER — Telehealth: Payer: Self-pay | Admitting: Internal Medicine

## 2021-12-16 ENCOUNTER — Encounter: Payer: Self-pay | Admitting: Internal Medicine

## 2021-12-16 NOTE — Telephone Encounter (Signed)
Pt call and stated she need you to give her a call back when you get this message about her Insulin.

## 2021-12-19 NOTE — Telephone Encounter (Signed)
Contacted patient. Voicemail has not set up yet.

## 2021-12-20 MED ORDER — TRESIBA FLEXTOUCH 100 UNIT/ML ~~LOC~~ SOPN: 22.0000 [IU] | PEN_INJECTOR | Freq: Every day | SUBCUTANEOUS | 1 refills | Status: DC

## 2021-12-20 NOTE — Telephone Encounter (Signed)
Patient called me as she received her insulin from the pharmacy but it was never sent in with the updated directions on it. Patient is injecting 22 units daily and needs a new rx to be sent in with these directions. Will route to [facility] to send in.

## 2021-12-20 NOTE — Telephone Encounter (Signed)
Updated Rx sent to [facility].

## 2021-12-25 NOTE — Telephone Encounter (Signed)
Called patient to let her know that I have set aside a sample Tresiba pen for her to pick up at her earliest convenience. Patient verbalized her understanding and will likely stop by on Friday this week.

## 2021-12-27 ENCOUNTER — Telehealth: Payer: Self-pay | Admitting: Internal Medicine

## 2021-12-27 NOTE — Telephone Encounter (Signed)
Pt came by and picked up Insulin that was given to her by Maddy.  FYI

## 2021-12-31 ENCOUNTER — Other Ambulatory Visit: Payer: Self-pay | Admitting: *Deleted

## 2021-12-31 MED ORDER — TRESIBA FLEXTOUCH 100 UNIT/ML ~~LOC~~ SOPN: 22.0000 [IU] | PEN_INJECTOR | Freq: Every day | SUBCUTANEOUS | 0 refills | Status: AC

## 2021-12-31 NOTE — Progress Notes (Signed)
A1c is improved  to 8.6  but still needs to come down to below 8 .   make appt with me forFebruary . ( 3 mos A1c to check)  Please keep the Gi appt  Holding the  decision about osteoporosis medication prevention until  more evaluation.

## 2022-01-13 ENCOUNTER — Ambulatory Visit: Payer: Medicare HMO | Admitting: Nurse Practitioner

## 2022-01-13 ENCOUNTER — Encounter: Payer: Self-pay | Admitting: Nurse Practitioner

## 2022-01-13 ENCOUNTER — Other Ambulatory Visit (INDEPENDENT_AMBULATORY_CARE_PROVIDER_SITE_OTHER): Payer: Medicare HMO

## 2022-01-13 VITALS — BP 100/62 | HR 120 | Ht 63.0 in | Wt 116.2 lb

## 2022-01-13 DIAGNOSIS — R634 Abnormal weight loss: Secondary | ICD-10-CM | POA: Diagnosis not present

## 2022-01-13 DIAGNOSIS — Z1211 Encounter for screening for malignant neoplasm of colon: Secondary | ICD-10-CM

## 2022-01-13 LAB — COMPREHENSIVE METABOLIC PANEL
ALT: 11 U/L (ref 0–35)
AST: 11 U/L (ref 0–37)
Albumin: 4 g/dL (ref 3.5–5.2)
Alkaline Phosphatase: 62 U/L (ref 39–117)
BUN: 10 mg/dL (ref 6–23)
CO2: 27 mEq/L (ref 19–32)
Calcium: 9 mg/dL (ref 8.4–10.5)
Chloride: 102 mEq/L (ref 96–112)
Creatinine, Ser: 0.57 mg/dL (ref 0.40–1.20)
GFR: 92.57 mL/min (ref >60.00)
Glucose, Bld: 331 mg/dL — ABNORMAL HIGH (ref 70–99)
Potassium: 4.2 mEq/L (ref 3.5–5.1)
Sodium: 136 mEq/L (ref 135–145)
Total Bilirubin: 0.6 mg/dL (ref 0.2–1.2)
Total Protein: 6.5 g/dL (ref 6.0–8.3)

## 2022-01-13 LAB — CBC WITH DIFFERENTIAL/PLATELET
Basophils Absolute: 0.1 10*3/uL (ref 0.0–0.1)
Basophils Relative: 1.3 % (ref 0.0–3.0)
Eosinophils Absolute: 0.1 10*3/uL (ref 0.0–0.7)
Eosinophils Relative: 1 % (ref 0.0–5.0)
HCT: 42.1 % (ref 36.0–46.0)
Hemoglobin: 14.9 g/dL (ref 12.0–15.0)
Lymphocytes Relative: 34.3 % (ref 12.0–46.0)
Lymphs Abs: 2.4 10*3/uL (ref 0.7–4.0)
MCHC: 35.4 g/dL (ref 30.0–36.0)
MCV: 86.8 fl (ref 78.0–100.0)
Monocytes Absolute: 0.5 10*3/uL (ref 0.1–1.0)
Monocytes Relative: 7.9 % (ref 3.0–12.0)
Neutro Abs: 3.8 10*3/uL (ref 1.4–7.7)
Neutrophils Relative %: 55.5 % (ref 43.0–77.0)
Platelets: 391 10*3/uL (ref 150.0–400.0)
RBC: 4.85 Mil/uL (ref 3.87–5.11)
RDW: 13.2 % (ref 11.5–15.5)
WBC: 6.9 10*3/uL (ref 4.0–10.5)

## 2022-01-13 MED ORDER — NA SULFATE-K SULFATE-MG SULF 17.5-3.13-1.6 GM/177ML PO SOLN: 1.0000 | Freq: Once | ORAL | 0 refills | Status: AC

## 2022-01-13 NOTE — Patient Instructions (Signed)
You have been scheduled for a colonoscopy. Please follow written instructions given to you at your visit today.  Please pick up your prep supplies at the pharmacy within the next 1-3 days. If you use inhalers (even only as needed), please bring them with you on the day of your procedure.   Your provider has requested that you go to the basement level for lab work before leaving today. Press "B" on the elevator. The lab is located at the first door on the left as you exit the elevator.   Contact our office if you wish to schedule an upper endoscopy.  Benefiber- 1 tablespoon daily  Due to recent changes in healthcare laws, you may see the results of your imaging and laboratory studies on MyChart before your provider has had a chance to review them.  We understand that in some cases there may be results that are confusing or concerning to you. Not all laboratory results come back in the same time frame and the provider may be waiting for multiple results in order to interpret others.  Please give [place] 48 hours in order for your provider to thoroughly review all the results before contacting the office for clarification of your results.    Thank you for trusting me with your gastrointestinal care!   Carl Best, CRNP

## 2022-01-13 NOTE — Progress Notes (Unsigned)
01/13/2022 [name] [name] [id number] Jul 07, 1950   CHIEF COMPLAINT: Weight loss x 2 years, on and off diarrhea, feels full easily   HISTORY OF PRESENT ILLNESS: [name] [name] is a 71 year old female with a past medical history of anxiety, hypertension, hyperlipidemia, hypertension, DM type II and IBS symptoms. She is remotely known by Dr. Fuller Plan. She presents to our office today as referred by Dr. Regis Bill for further evaluation regarding weight loss and abnormal CT findings which showed a distended stomach without gastric wall thickening and focal wall thickening versus underdistention to the proximal duodenum. A large amount of stool was present. The pancreas was normal. She endorses losing 50lbs over the past 2 1/2 years. She was diagnosed with diabetes type II a few years ago which required Metformin and Tresibia insulin. Blood sugars are poorly controlled. She complains of having frequent stomach churning. She had nausea and vomited up brownish matter, not coffee ground, Thanksgiving evening without recurrence. No dysphagia or heartburn. She has early satiety for the past 6 to 12 months which has recently improved. Her bowel pattern varies. She passes a soft or hard stool every few days and has watery diarrhea twice monthly for the past 2 to 3 years. No rectal bleeding or black stools. She underwent a colonoscopy by Dr. Fuller Plan 09/13/2005 due to having diarrhea. I was unable to locate the colonoscopy procedure report in Epic but the random colon biopsies were negative for microscopic colitis/IBD. She underwent a Cologuard test 05/2021 which was negative. She was last seen in office by Dr. Fuller Plan 06/26/2010 due to having IBS-D treated with Xifaxan and Robinul.   Her heart rate is 120 b/min at this time. She stated her HR is always high when she goes to a doctor's office. No CP, palpitations or dizziness. She drinks a few glasses of water daily. She urinates normal yellow urine 6 times daily.       Latest Ref Rng & Units 08/27/2021    5:53 PM 08/27/2021   12:13 PM 06/20/2020    9:39 AM  CBC  WBC 4.0 - 10.5 K/uL 6.0  5.7  6.2   Hemoglobin 12.0 - 15.0 g/dL 15.7  14.9  15.8   Hematocrit 36.0 - 46.0 % 44.2  43.8  45.0   Platelets 150 - 400 K/uL 328  344.0  321.0        Latest Ref Rng & Units 08/27/2021    5:53 PM 08/27/2021   12:13 PM 11/05/2020   11:16 AM  CMP  Glucose 70 - 99 mg/dL 337  577  343   BUN 8 - 23 mg/dL _0 Creatinine 0.44 - 1.00 mg/dL 0.55  0.57  0.58   Sodium 135 - 145 mmol/L 134  131  134   Potassium 3.5 - 5.1 mmol/L 4.1  4.5  4.3   Chloride 98 - 111 mmol/L 104  99  100   CO2 22 - 32 mmol/L _1 Calcium 8.9 - 10.3 mg/dL 9.1  9.1  9.3   Total Protein 6.0 - 8.3 g/dL  6.1  6.0   Total Bilirubin 0.2 - 1.2 mg/dL  0.6  0.8   Alkaline Phos 39 - 117 U/L  56  54   AST 0 - 37 U/L  13  10   ALT 0 - 35 U/L  11  7     CTAP 10/25/2021 with contrast:  CT CHEST FINDINGS   Cardiovascular:  Normal caliber thoracic aorta. Aortic atherosclerosis. No central pulmonary embolus on this nondedicated study. Normal size heart. No significant pericardial effusion/thickening.   Mediastinum/Nodes: No supraclavicular adenopathy. No suspicious thyroid nodule. No pathologically enlarged mediastinal, hilar or axillary lymph nodes. The esophagus is grossly unremarkable.   Lungs/Pleura: No suspicious pulmonary nodules or masses. No focal airspace consolidation. No pleural effusion. No pneumothorax.   Musculoskeletal: Bridging anterior vertebral osteophytes with relative preservation of the thoracic disc spaces compatible with diffuse idiopathic skeletal hyperostosis.   CT ABDOMEN PELVIS FINDINGS   Hepatobiliary: No suspicious hepatic lesion. Cholelithiasis without findings of acute cholecystitis. No biliary ductal dilation.   Pancreas: No pancreatic ductal dilation or evidence of acute inflammation.   Spleen: No splenomegaly or focal splenic lesion.    Adrenals/Urinary Tract: Two small sub/pericentimeter adrenal nodules are most consistent with small adenomas and requiring no independent imaging follow-up. No hydronephrosis. Hypodense 1 cm left upper pole renal lesion is technically too small to accurately characterize but statistically likely reflect a cyst and considered benign requiring no independent imaging follow-up. Urinary bladder is unremarkable for degree of distension.   Stomach/Bowel: Radiopaque enteric contrast material traverses the descending colon. Stomach is distended with ingested material and gas without focal wall thickening. Focal wall thickening versus underdistention of the proximal duodenum on image 70/2. No pathologic dilation of small or large bowel. Large volume of formed stool throughout the colon.   Vascular/Lymphatic: Aortic atherosclerosis. No pathologically enlarged abdominal or pelvic lymph nodes.   Reproductive: Uterus and bilateral adnexa are unremarkable.   Other: No significant abdominopelvic free fluid.   Musculoskeletal: Thoracic spondylosis.   IMPRESSION: 1. Focal wall thickening versus underdistention of the proximal duodenum, consider further evaluation with upper endoscopy if clinically indicated. 2. Stomach is distended with ingested contents and gas without focal wall thickening, possibly reflecting gastroparesis, consider further evaluation with nuclear medicine gastric emptying study. 3. Large volume of formed stool throughout the colon suggestive of constipation. 4. Cholelithiasis without findings of acute cholecystitis. 5. Two small sub/pericentimeter adrenal nodules are statistically likely benign adenomas and requiring no independent imaging follow-up. 6. Hypodense 1 cm left upper pole renal lesion is technically too small to accurately characterize but statistically likely to reflect a cyst and considered benign requiring no independent imaging follow-up. 7.  Aortic  Atherosclerosis       Past Medical History:  Diagnosis Date   Allergic rhinitis    Anxiety    Diabetes mellitus    Gallstones    Hyperlipidemia    Hypertension    IBS (irritable bowel syndrome)    Normal nuclear stress test 02/10/2006   low risk study    Obesity    Sciatica    Past Surgical History:  Procedure Laterality Date   CESAREAN SECTION     x 3; 49,70,26   NM MYOVIEW LTD  02/2006   low risk study   Social History: She is divorced. She has 2 sons and one daughter. She lives with her son. Nonsmoker. No alcohol use. No drug use.   Family History: family history includes Acute myelogenous leukemia in her mother; Breast cancer in her paternal grandmother; Diabetes in her father and paternal grandfather; Lung cancer in her maternal grandmother; Skin cancer in her maternal grandfather. No known family history of esophageal, gastric or colon cancer.    Allergies  Allergen Reactions   Naproxen     REACTION: Dizziness and nausea, slowed down pt's reaction time   Simvastatin     Joint Pain  Outpatient Encounter Medications as of 01/13/2022  Medication Sig   Accu-Chek Softclix Lancets lancets 1 each by Other route daily. Dx E11.9   atorvastatin (LIPITOR) 40 MG tablet TAKE ONE TABLET BY MOUTH ONCE DAILY   Blood Glucose Monitoring Suppl (ACCU-CHEK GUIDE) w/Device KIT Daily use   Cholecalciferol (VITAMIN D) 125 MCG (5000 UT) CAPS Take 50,000 Units by mouth once a week.   glucose blood (ACCU-CHEK GUIDE) test strip 1 each by Other route daily. Dx E11.9   glucose blood test strip Use to check blood sugar 1-2 times daily   insulin aspart (FIASP FLEXTOUCH) 100 UNIT/ML FlexTouch Pen Sample, Lot:[id number] Exp: 03/13/2023   Insulin Aspart, w/Niacinamide, (FIASP) 100 UNIT/ML SOLN Inject 4 Units as directed daily. Before dinner   insulin degludec (TRESIBA FLEXTOUCH) 100 UNIT/ML FlexTouch Pen Inject 22 Units into the skin daily.   Insulin Pen Needle (CAREFINE PEN NEEDLES) 32G X 4 MM  MISC Use to inject insulin daily   metFORMIN (GLUCOPHAGE) 1000 MG tablet TAKE 1 TABLET BY MOUTH 2 TIMES DAILY WITH A MEAL.   losartan (COZAAR) 50 MG tablet Take 1 tablet (50 mg total) by mouth daily. (Patient not taking: Reported on 09/18/2021)   No facility-administered encounter medications on file as of 01/13/2022.   REVIEW OF SYSTEMS:  Gen: + weight loss. Denies fever, sweats or chills.  CV: Denies chest pain, palpitations or edema. Resp: Denies cough, shortness of breath of hemoptysis.  GI: See HPI.  GU : Denies urinary burning, blood in urine, increased urinary frequency or incontinence. MS: Denies joint pain, muscles aches or weakness. Derm: Denies rash, itchiness, skin lesions or unhealing ulcers. Psych: + Anxiety.  Heme: Denies bruising, easy bleeding. Neuro:  + Vision changes. Denies headaches, dizziness or paresthesias. Endo:  Denies any problems with DM, thyroid or adrenal function.  PHYSICAL EXAM: BP 100/62   Pulse (!) 120   Ht _0  (1.6 m)   Wt 116 lb 4 oz (52.7 kg)   BMI 20.59 kg/m  Wt Readings from Last 3 Encounters:  01/13/22 116 lb 4 oz (52.7 kg)  12/09/21 115 lb (52.2 kg)  10/01/21 107 lb (48.5 kg)  11/05/2020     127lb 11/30/2018     169lb  General: Thin pale appearing female in NAD.  Head: Normocephalic and atraumatic. Eyes:  Sclerae non-icteric, conjunctive pink. Ears: Normal auditory acuity. Mouth: Dentition intact. No ulcers or lesions. Dry mouth and buccal mucosa.  Neck: Supple, no lymphadenopathy or thyromegaly.  Lungs: Clear bilaterally to auscultation without wheezes, crackles or rhonchi. Heart: Regular rate and rhythm. No murmur, rub or gallop appreciated.  Abdomen: Soft, nontender, non distended. No masses. No hepatosplenomegaly. Normoactive bowel sounds x 4 quadrants.  Rectal: Deferred.  Musculoskeletal: Symmetrical with no gross deformities. Skin: Warm and dry. No rash or lesions on visible extremities. Extremities: No edema. Neurological:  Alert oriented x 4, no focal deficits.  Psychological:  Alert and cooperative. Normal mood and affect.  ASSESSMENT AND PLAN:  53) 71 year old female with early satiety, N/V on Thanksgiving evening with a 50lb weight loss over the past 2 1/2 years. CTAP showed showed a distended stomach without gastric wall thickening and focal wall thickening versus underdistention to the proximal duodenum. -EGD recommended to rule out PUD/UGI malignancy. Patient declines EGD. -Diagnostic colonoscopy to rule out IBD/lower GI malignancy. Colonoscopy benefits and risks discussed including risk with sedation, risk of bleeding, perforation and infection. Patient is willing to purse a colonoscopy -Recommended eating 3 to 4 snack sized  meals -CBC, CMP  2) Colon cancer screening. Colonoscopy 09/2005, no polyps. Cologuard negative 05/2021. -See plan in # 1  3) Variable bowel pattern -Benefiber 1 tablespoon daily if tolerated   4) DM II, poorly controlled which is likely contributing to weight loss. CTAP showed a normal pancreas. HgA1c 11.1 -> 8.6%. -Recommend nutritionist consult if not already done per endocrinology   5) Tachycardia. HR 120 b/m. Patient reported having white coat syndrome. Daily water intake is inadequate.  -Push fluids, drinks 6 to 8 glasses of water daily -Follow up with PCP, if tachycardia persists she will require cardiac evaluation   6) Cholelithiasis per CT  7) Two small adrenal nodules per CT -Follow up with PCP        CC:  Panosh, Standley Brooking, MD

## 2022-01-14 ENCOUNTER — Other Ambulatory Visit: Payer: Self-pay

## 2022-01-14 ENCOUNTER — Telehealth: Payer: Self-pay | Admitting: Nurse Practitioner

## 2022-01-14 ENCOUNTER — Encounter: Payer: Self-pay | Admitting: Gastroenterology

## 2022-01-14 MED ORDER — CLENPIQ 10-3.5-12 MG-GM -GM/160ML PO SOLN: 1.0000 | ORAL | 0 refills | Status: DC

## 2022-01-14 NOTE — Telephone Encounter (Signed)
Contacted patient and informed patient that Clenpiq was sent to pharmacy and instructions will be mailed to her home.

## 2022-01-14 NOTE — Telephone Encounter (Signed)
Inbound call from pharmacy stating that patients Insurance will not cover Suprep and is requesting either Clenpiq or  Peg 3350 be sent in for patient. Please advise.

## 2022-01-16 ENCOUNTER — Telehealth: Payer: Self-pay

## 2022-01-16 NOTE — Telephone Encounter (Signed)
Patient approved for Novo Nordisk PAP through 02/09/22.

## 2022-01-22 ENCOUNTER — Ambulatory Visit (AMBULATORY_SURGERY_CENTER): Payer: Medicare HMO | Admitting: Gastroenterology

## 2022-01-22 ENCOUNTER — Encounter: Payer: Self-pay | Admitting: Gastroenterology

## 2022-01-22 VITALS — BP 142/87 | HR 99 | Temp 97.3°F | Resp 12 | Ht 63.0 in | Wt 116.0 lb

## 2022-01-22 DIAGNOSIS — R634 Abnormal weight loss: Secondary | ICD-10-CM

## 2022-01-22 DIAGNOSIS — D125 Benign neoplasm of sigmoid colon: Secondary | ICD-10-CM

## 2022-01-22 DIAGNOSIS — Z1211 Encounter for screening for malignant neoplasm of colon: Secondary | ICD-10-CM

## 2022-01-22 DIAGNOSIS — K640 First degree hemorrhoids: Secondary | ICD-10-CM | POA: Diagnosis not present

## 2022-01-22 DIAGNOSIS — D123 Benign neoplasm of transverse colon: Secondary | ICD-10-CM

## 2022-01-22 MED ORDER — SODIUM CHLORIDE 0.9 % IV SOLN: 500.0000 mL | Freq: Once | INTRAVENOUS | Status: DC

## 2022-01-22 NOTE — Progress Notes (Signed)
A and O x3. Report to RN. Tolerated MAC anesthesia well. 

## 2022-01-22 NOTE — Progress Notes (Signed)
Called to room to assist during endoscopic procedure.  Patient ID and intended procedure confirmed with present staff. Received instructions for my participation in the procedure from the performing physician.  

## 2022-01-22 NOTE — Op Note (Signed)
[place] Patient Name: Francenia Chimenti Procedure Date: 01/22/2022 8:43 AM MRN: [id number] Endoscopist: Ladene Artist , MD, [id number] Age: 71 Referring MD:  Date of Birth: 10/29/1950 Gender: Female Account #: [id number] Procedure:                Colonoscopy Indications:              Weight loss Medicines:                Monitored Anesthesia Care Procedure:                Pre-Anesthesia Assessment:                           - Prior to the procedure, a History and Physical                            was performed, and patient medications and                            allergies were reviewed. The patient's tolerance of                            previous anesthesia was also reviewed. The risks                            and benefits of the procedure and the sedation                            options and risks were discussed with the patient.                            All questions were answered, and informed consent                            was obtained. Prior Anticoagulants: The patient has                            taken no anticoagulant or antiplatelet agents. ASA                            Grade Assessment: III - A patient with severe                            systemic disease. After reviewing the risks and                            benefits, the patient was deemed in satisfactory                            condition to undergo the procedure.                           After obtaining informed consent, the colonoscope  was passed under direct vision. Throughout the                            procedure, the patient's blood pressure, pulse, and                            oxygen saturations were monitored continuously. The                            [id number]-HQ190L Colonoscope was introduced through the                            anus and advanced to the the cecum, identified by                            appendiceal orifice and ileocecal  valve. The                            ileocecal valve, appendiceal orifice, and rectum                            were photographed. The quality of the bowel                            preparation was good. The colonoscopy was performed                            without difficulty. The patient tolerated the                            procedure well. Scope In: 8:53:07 AM Scope Out: 9:12:10 AM Scope Withdrawal Time: 0 hours 15 minutes 9 seconds  Total Procedure Duration: 0 hours 19 minutes 3 seconds  Findings:                 The perianal and digital rectal examinations were                            normal.                           Five sessile polyps were found in the sigmoid colon                            (1) and transverse colon (4). The polyps were 6 to                            8 mm in size. These polyps were removed with a cold                            snare. Resection and retrieval were complete.                           Internal hemorrhoids were found during  retroflexion. The hemorrhoids were small and Grade                            I (internal hemorrhoids that do not prolapse).                           The exam was otherwise without abnormality on                            direct and retroflexion views. Complications:            No immediate complications. Estimated blood loss:                            None. Estimated Blood Loss:     Estimated blood loss: none. Impression:               - Five 6 to 8 mm polyps in the sigmoid colon and in                            the transverse colon, removed with a cold snare.                            Resected and retrieved.                           - Internal hemorrhoids.                           - The examination was otherwise normal on direct                            and retroflexion views. Recommendation:           - Repeat colonoscopy after studies are complete for                             surveillance based on pathology results.                           - Patient has a contact number available for                            emergencies. The signs and symptoms of potential                            delayed complications were discussed with the                            patient. Return to normal activities tomorrow.                            Written discharge instructions were provided to the                            patient.                           -  Resume previous diet.                           - Continue present medications.                           - Await pathology results.                           - Reconsider EGD if early satiety or weight loss                            persists. Ladene Artist, MD 01/22/2022 9:16:04 AM This report has been signed electronically.

## 2022-01-22 NOTE — Patient Instructions (Signed)
Handout on hemorrhoids and polyps given to patient.  Await pathology results. Resume previous diet and continue present medications. Repeat colonoscopy for surveillance will be determined based off of pathology results. Reconsider EGD if early satiety or weight loss persists.   YOU HAD AN ENDOSCOPIC PROCEDURE TODAY AT [facility]:   Refer to the procedure report that was given to you for any specific questions about what was found during the examination.  If the procedure report does not answer your questions, please call your gastroenterologist to clarify.  If you requested that your care partner not be given the details of your procedure findings, then the procedure report has been included in a sealed envelope for you to review at your convenience later.  YOU SHOULD EXPECT: Some feelings of bloating in the abdomen. Passage of more gas than usual.  Walking can help get rid of the air that was put into your GI tract during the procedure and reduce the bloating. If you had a lower endoscopy (such as a colonoscopy or flexible sigmoidoscopy) you may notice spotting of blood in your stool or on the toilet paper. If you underwent a bowel prep for your procedure, you may not have a normal bowel movement for a few days.  Please Note:  You might notice some irritation and congestion in your nose or some drainage.  This is from the oxygen used during your procedure.  There is no need for concern and it should clear up in a day or so.  SYMPTOMS TO REPORT IMMEDIATELY:  Following lower endoscopy (colonoscopy or flexible sigmoidoscopy):  Excessive amounts of blood in the stool  Significant tenderness or worsening of abdominal pains  Swelling of the abdomen that is new, acute  Fever of 100F or higher  For urgent or emergent issues, a gastroenterologist can be reached at any hour by calling [phone]. Do not use MyChart messaging for urgent concerns.    DIET:  We do recommend a  small meal at first, but then you may proceed to your regular diet.  Drink plenty of fluids but you should avoid alcoholic beverages for 24 hours.  ACTIVITY:  You should plan to take it easy for the rest of today and you should NOT DRIVE or use heavy machinery until tomorrow (because of the sedation medicines used during the test).    FOLLOW UP: Our staff will call the number listed on your records the next business day following your procedure.  We will call around 7:15- 8:00 am to check on you and address any questions or concerns that you may have regarding the information given to you following your procedure. If we do not reach you, we will leave a message.     If any biopsies were taken you will be contacted by phone or by letter within the next 1-3 weeks.  Please call us at [phone] if you have not heard about the biopsies in 3 weeks.    SIGNATURES/CONFIDENTIALITY: You and/or your care partner have signed paperwork which will be entered into your electronic medical record.  These signatures attest to the fact that that the information above on your After Visit Summary has been reviewed and is understood.  Full responsibility of the confidentiality of this discharge information lies with you and/or your care-partner.

## 2022-01-22 NOTE — Progress Notes (Signed)
Pt's states no medical or surgical changes since previsit or office visit. 

## 2022-01-22 NOTE — Progress Notes (Signed)
See 12/4/20203 H&P, no changes

## 2022-01-23 ENCOUNTER — Telehealth: Payer: Self-pay | Admitting: *Deleted

## 2022-01-23 NOTE — Telephone Encounter (Signed)
No answer for post procedure callback. Unable to leave VM. 

## 2022-01-28 ENCOUNTER — Telehealth: Payer: Self-pay | Admitting: Pharmacist

## 2022-01-28 NOTE — Chronic Care Management (AMB) (Signed)
    Chronic Care Management Pharmacy Assistant   Name: Yailyn Strack  MRN: [id number] DOB: 1950-08-15  01/29/2022 APPOINTMENT REMINDER  Called [name], No answer, voice mail not set up, unable to leave message of appointment on 01/29/2022 at 1:00 via office visit with Jeni Salles, Pharm D.   Care Gaps: AWV - scheduled 10/03/2022 Last BP - 100/62 on 01/13/2022 Last A1C - 8.6 on 11/30/2021 Hep C Screen - never done Tdap - overdue Foot exam - overdue Urine ACR - overdue Pneumonia - postponed Mammogram - postponed Covid - postponed Shingrix - postponed Flu - postponed  Star Rating Drug: Atorvastatin 40 mg - last filled 12/13/2021 60 DS at [facility] Losartan 50 mg - last filled 04/17/2021 90 DS at [facility] verified with pharm tech, Patient not taking: Reported on 09/18/2021. Metformin 1000 mg - last filled 11/06/2021 90 DS at [facility]   Any gaps in medications fill history? Yes  [place] Pharmacist Assistant [phone]

## 2022-01-29 ENCOUNTER — Other Ambulatory Visit: Payer: Self-pay | Admitting: Adult Health

## 2022-01-29 ENCOUNTER — Ambulatory Visit (INDEPENDENT_AMBULATORY_CARE_PROVIDER_SITE_OTHER): Payer: Medicare HMO | Admitting: Pharmacist

## 2022-01-29 ENCOUNTER — Other Ambulatory Visit: Payer: Self-pay | Admitting: Internal Medicine

## 2022-01-29 VITALS — BP 108/62

## 2022-01-29 DIAGNOSIS — I10 Essential (primary) hypertension: Secondary | ICD-10-CM

## 2022-01-29 DIAGNOSIS — E1165 Type 2 diabetes mellitus with hyperglycemia: Secondary | ICD-10-CM

## 2022-01-29 MED ORDER — FIASP FLEXTOUCH 100 UNIT/ML ~~LOC~~ SOPN: 10.0000 [IU] | PEN_INJECTOR | Freq: Two times a day (BID) | SUBCUTANEOUS | 0 refills | Status: DC

## 2022-01-29 NOTE — Progress Notes (Signed)
Chronic Care Management Pharmacy Note  01/29/2022 Name:  [name] [name] MRN:  [id number] DOB:  [birth year]  Summary: A1c not at goal < 7% Fasting blood sugars are elevated but patient has been eating more sweets  Recommendations/Changes made from today's visit: -Recommended addition of Fiasp 4 units for lunch time -Recommended purchasing a BP monitor -Requested updated testing supplies  Plan: Follow up in 2-3 weeks on blood sugar readings  Subjective: [name] [name] is an 71 y.o. year old female who is a primary patient of Panosh, Standley Brooking, MD.  The CCM team was consulted for assistance with disease management and care coordination needs.    Engaged with patient by telephone for follow up visit in response to provider referral for pharmacy case management and/or care coordination services.   Consent to Services:  The patient was given information about Chronic Care Management services, agreed to services, and gave verbal consent prior to initiation of services.  Please see initial visit note for detailed documentation.   Patient Care Team: Panosh, Standley Brooking, MD as PCP - General Haygood, Seymour Bars, MD (Inactive) as Attending Physician (Obstetrics and Gynecology) Viona Gilmore, [facility] as Pharmacist (Pharmacist)  Recent office visits: 12/09/21 [name], MD: Patient presented for video visit for follow up from [place]. Plan to revisit bisphosphonate after GI assessment.  08/27/21 [name] MD - Patient was seen for Type 2 diabetes mellitus with hyperglycemia, without long-term current use of insulin and additional issues. Discontinued Glimepiride, Pioglitazone and Valsartan. Prescribed vitamin D 50,000 units once weekly x 12 weeks.  Recent consult visits: 01/13/22 Carl Best, NP (gastro): Patient presented for colonoscopy prep.   Hospital visits: Patient was seen at [facility] ED on 08/27/2021 (5 hours) due to Hyperglycemia.    New?Medications  Started at [facility] Discharge:?? No medications started Medication Changes at Hospital Discharge: No medications changed Medications Discontinued at Hospital Discharge: No medications discontinued Medications that remain the same after Hospital Discharge:??  -All other medications will remain the same.     Objective:  Lab Results  Component Value Date   CREATININE 0.57 01/13/2022   BUN 10 01/13/2022   GFR 92.57 01/13/2022   GFRNONAA >60 08/27/2021   GFRAA 96 02/23/2007   NA 136 01/13/2022   K 4.2 01/13/2022   CALCIUM 9.0 01/13/2022   CO2 27 01/13/2022   GLUCOSE 331 (H) 01/13/2022    Lab Results  Component Value Date/Time   HGBA1C 8.6 (A) 12/10/2021 02:13 PM   HGBA1C 11.1 (A) 08/27/2021 11:39 AM   HGBA1C 11.0 (H) 11/05/2020 11:16 AM   HGBA1C 12.3 (H) 06/20/2020 09:39 AM   GFR 92.57 01/13/2022 03:38 PM   GFR 92.82 08/27/2021 12:13 PM   MICROALBUR <0.7 06/20/2020 09:39 AM   MICROALBUR <0.7 01/04/2019 10:05 AM    Last diabetic Eye exam:  Lab Results  Component Value Date/Time   HMDIABEYEEXA No Retinopathy 12/13/2021 12:00 AM    Last diabetic Foot exam:  Lab Results  Component Value Date/Time   HMDIABFOOTEX Done 05/25/2009 12:00 AM     Lab Results  Component Value Date   CHOL 211 (H) 08/27/2021   HDL 72.30 08/27/2021   LDLCALC 73 10/09/2011   LDLDIRECT 105.0 08/27/2021   TRIG 277.0 (H) 08/27/2021   CHOLHDL 3 08/27/2021       Latest Ref Rng & Units 01/13/2022    3:38 PM 08/27/2021   12:13 PM 11/05/2020   11:16 AM  Hepatic Function  Total Protein 6.0 - 8.3 g/dL 6.5  6.1  6.0   Albumin 3.5 - 5.2 g/dL 4.0  3.9  3.9   AST 0 - 37 U/L _0 ALT 0 - 35 U/L _1 Alk Phosphatase 39 - 117 U/L 62  56  54   Total Bilirubin 0.2 - 1.2 mg/dL 0.6  0.6  0.8   Bilirubin, Direct 0.0 - 0.3 mg/dL  0.1  0.1     Lab Results  Component Value Date/Time   TSH 1.58 08/27/2021 12:13 PM   TSH 1.61 06/20/2020 09:39 AM   FREET4 1.07 08/27/2021 12:13 PM   FREET4  1.04 06/20/2020 09:39 AM       Latest Ref Rng & Units 01/13/2022    3:38 PM 08/27/2021    5:53 PM 08/27/2021   12:13 PM  CBC  WBC 4.0 - 10.5 K/uL 6.9  6.0  5.7   Hemoglobin 12.0 - 15.0 g/dL 14.9  15.7  14.9   Hematocrit 36.0 - 46.0 % 42.1  44.2  43.8   Platelets 150.0 - 400.0 K/uL 391.0  328  344.0     Lab Results  Component Value Date/Time   VD25OH 13.16 (L) 08/27/2021 12:13 PM   VD25OH 44 03/27/2009 08:11 PM   VD25OH 22 (L) 09/05/2008 09:48 PM    Clinical ASCVD: No  The 10-year ASCVD risk score (Arnett DK, et al., 2019) is: 14.6%   Values used to calculate the score:     Age: 71 years     Sex: Female     Is Non-Hispanic African American: No     Diabetic: Yes     Tobacco smoker: No     Systolic Blood Pressure: 735 mmHg     Is BP treated: Yes     HDL Cholesterol: 72.3 mg/dL     Total Cholesterol: 211 mg/dL       10/01/2021   12:54 PM 08/27/2021   11:34 AM 11/05/2020   11:15 AM  Depression screen PHQ 2/9  Decreased Interest 0 0 0  Down, Depressed, Hopeless 0 0 0  PHQ - 2 Score 0 0 0  Altered sleeping 0 2 2  Tired, decreased energy 0 2 2  Change in appetite 0 0 1  Feeling bad or failure about yourself  0 0 0  Trouble concentrating 0 0 0  Moving slowly or fidgety/restless 0 0 0  Suicidal thoughts 0 0 0  PHQ-9 Score 0 4 5  Difficult doing work/chores Not difficult at all Not difficult at all Somewhat difficult      Social History   Tobacco Use  Smoking Status Never  Smokeless Tobacco Never   BP Readings from Last 3 Encounters:  01/29/22 108/62  01/22/22 (!) 142/87  01/13/22 100/62   Pulse Readings from Last 3 Encounters:  01/22/22 99  01/13/22 (!) 120  08/27/21 96   Wt Readings from Last 3 Encounters:  01/22/22 116 lb (52.6 kg)  01/13/22 116 lb 4 oz (52.7 kg)  12/09/21 115 lb (52.2 kg)   BMI Readings from Last 3 Encounters:  01/22/22 20.55 kg/m  01/13/22 20.59 kg/m  12/09/21 20.37 kg/m    Assessment/Interventions: Review of patient past  medical history, allergies, medications, health status, including review of consultants reports, laboratory and other test data, was performed as part of comprehensive evaluation and provision of chronic care management services.   SDOH:  (Social Determinants of Health) assessments and interventions performed: Yes (last 10/01/21) SDOH Interventions  Flowsheet Row Clinical Support from 10/01/2021 in [place] [facility] [place] Management from 09/18/2021 in [place] [facility] [place] Interventions Intervention Not Indicated --  Housing Interventions Intervention Not Indicated --  Transportation Interventions Intervention Not Indicated Intervention Not Indicated  Financial Strain Interventions Intervention Not Indicated Intervention Not Indicated  Physical Activity Interventions Intervention Not Indicated --  Stress Interventions Intervention Not Indicated --  Social Connections Interventions Intervention Not Indicated --      SDOH Screenings   Food Insecurity: No Food Insecurity (10/01/2021)  Housing: Low Risk  (10/01/2021)  Transportation Needs: No Transportation Needs (10/01/2021)  Alcohol Screen: Low Risk  (10/01/2021)  Depression (PHQ2-9): Low Risk  (10/01/2021)  Financial Resource Strain: Low Risk  (10/01/2021)  Physical Activity: Inactive (10/01/2021)  Social Connections: Socially Isolated (10/01/2021)  Stress: No Stress Concern Present (10/01/2021)  Tobacco Use: Low Risk  (01/22/2022)     CCM Care Plan  Allergies  Allergen Reactions   Naproxen     REACTION: Dizziness and nausea, slowed down pt's reaction time   Simvastatin     Joint Pain    Medications Reviewed Today     Reviewed by Ladene Artist, MD (Physician) on 01/22/22 at 0847  Med List Status: <None>   Medication Order Taking? Sig Documenting Provider Last Dose Status Informant  0.9 %  sodium chloride infusion [id number]   Ladene Artist, MD  Active    Accu-Chek Softclix Lancets lancets [id number] Yes 1 each by Other route daily. Dx E11.9 Burnis Medin, MD 01/22/2022 Active   atorvastatin (LIPITOR) 40 MG tablet [id number] Yes TAKE ONE TABLET BY MOUTH ONCE DAILY Panosh, Standley Brooking, MD Past Week Active   Blood Glucose Monitoring Suppl (ACCU-CHEK GUIDE) w/Device KIT [id number] Yes Daily use Panosh, Standley Brooking, MD 01/22/2022 Active   Cholecalciferol (VITAMIN D) 125 MCG (5000 UT) CAPS [id number] Yes Take 50,000 Units by mouth once a week. Panosh, Standley Brooking, MD Past Week Active   glucose blood (ACCU-CHEK GUIDE) test strip [id number] Yes 1 each by Other route daily. Dx E11.9 Burnis Medin, MD 01/22/2022 Active   glucose blood test strip [id number] Yes Use to check blood sugar 1-2 times daily Panosh, Standley Brooking, MD 01/22/2022 Active   insulin aspart (FIASP FLEXTOUCH) 100 UNIT/ML FlexTouch Pen [id number] No Sample, Lot:[id number] Exp: 03/13/2023 Panosh, Standley Brooking, MD Unknown Active   Insulin Aspart, w/Niacinamide, (FIASP) 100 UNIT/ML SOLN [id number] No Inject 4 Units as directed daily. Before dinner [provider] Unknown Active   insulin degludec (TRESIBA FLEXTOUCH) 100 UNIT/ML FlexTouch Pen [id number] Yes Inject 22 Units into the skin daily. Panosh, Standley Brooking, MD Past Week Active   Insulin Pen Needle (CAREFINE PEN NEEDLES) 32G X 4 MM MISC [id number] Yes Use to inject insulin daily Panosh, Standley Brooking, MD Past Week Active   losartan (COZAAR) 50 MG tablet [id number] No Take 1 tablet (50 mg total) by mouth daily.  Patient not taking: Reported on 09/18/2021   Burnis Medin, MD Not Taking Active            Med Note Ruthe Mannan   Mon Jan 13, 2022  2:38 PM) Temp not taking  metFORMIN (GLUCOPHAGE) 1000 MG tablet [id number] Yes TAKE 1 TABLET BY MOUTH 2 TIMES DAILY WITH A MEAL. Burnis Medin, MD 01/21/2022 Active             Patient Active Problem List   Diagnosis Date Noted   Type 2 diabetes  mellitus with hyperglycemia, without long-term current use of insulin ([place])  03/03/2016   Lump in neck 07/28/2014   Pain in shoulder region 07/28/2014   Jaw pain, non-TMJ 10/06/2013   Leg pain, lateral 06/18/2012   Skin lesion 01/15/2012   IBS (irritable bowel syndrome) 10/07/2011   Breast nodule 08/11/2011   Intentional underdosing of medication regimen by patient due to financial hardship 05/30/2011   Stress at home 05/30/2011   Breast tenderness in female 01/10/2011   Contact dermatitis and eczema due to plant 09/12/2010   NON ADHERENCE  TO MEDICATION 01/21/2010   OBESITY 09/10/2009   PLANTAR FASCIITIS, BILATERAL 05/25/2009   CHOLELITHIASIS 10/09/2008   SCIATICA 10/09/2008   THYROID FUNCTION TEST, ABNORMAL 10/09/2008   IBS 07/12/2008   LEG PAIN, LEFT 07/12/2008   SHOULDER PAIN, LEFT 07/07/2007   HIP PAIN, LEFT 07/07/2007   Vitamin D deficiency 03/22/2007   Hyperlipidemia 01/12/2007   Essential hypertension 01/12/2007   ALLERGIC RHINITIS 01/12/2007   MUSCULOSKELETAL PAIN 01/12/2007    Immunization History  Administered Date(s) Administered   Fluad Quad(high Dose 65+) 11/30/2018, 11/05/2020   H1N1 04/28/2008   Influenza Split 01/10/2011, 01/15/2012   Influenza Whole 01/12/2007, 05/11/2008, 01/21/2010   Influenza, High Dose Seasonal PF 12/08/2017   Influenza,inj,Quad PF,6+ Mos 10/19/2012, 10/06/2013, 03/22/2015, 01/18/2016, 12/07/2016   Influenza-Unspecified 10/19/2012, 10/06/2013, 03/22/2015, 01/18/2016, 12/07/2016   PFIZER(Purple Top)SARS-COV-2 Vaccination 05/24/2019, 06/15/2019   Pneumococcal Polysaccharide-23 01/21/2010   Td 02/10/2005   Patient reports she is still having after meal blood sugar elevated readings. She is still injecting Fiasp only prior to dinner time but inquired about lunch time. Patient also needs updated directions for her lancets and test strips to be for 3 times a day testing because she is doing that frequently.  Patient is not checking BP at home but plans to. She is going to call [employer] today to ask about getting a BP  cuff from their catalog as she hasn't used up the money yet from her stipend.  Conditions to be addressed/monitored:  Hypertension, Hyperlipidemia, Diabetes, and Vitamin D deficiency  Conditions addressed this visit: Diabetes, hypertension   Care Plan : Pharmacy Care Plan  Updates made by Viona Gilmore, [place] since 01/29/2022 12:00 AM     Problem: Problem: Hypertension, Hyperlipidemia, Diabetes, and Vitamin D deficiency      Long-Range Goal: Patient-Specific Goal   Start Date: 09/18/2021  Expected End Date: 09/19/2022  Recent Progress: On track  Priority: High  Note:   Current Barriers:  Unable to independently monitor therapeutic efficacy Unable to achieve control of diabetes  Unable to maintain control of cholesterol Unable to self administer medications as prescribed  Pharmacist Clinical Goal(s):  Patient will achieve adherence to monitoring guidelines and medication adherence to achieve therapeutic efficacy achieve control of diabetes as evidenced by A1c maintain control of cholesterol as evidenced by next lipid panel  through collaboration with PharmD and provider.   Interventions: 1:1 collaboration with Panosh, Standley Brooking, MD regarding development and update of comprehensive plan of care as evidenced by provider attestation and co-signature Inter-disciplinary care team collaboration (see longitudinal plan of care) Comprehensive medication review performed; medication list updated in electronic medical record  Hypertension (BP goal <130/80) -Controlled -Current treatment: No medications -Medications previously tried: losartan  -Current home readings: does not check at home; does not own a BP cuff yet but plans to purchase one -Current dietary habits: cooks with very little salt, ocasional TV dinner; soup now and then; looking at sodium content -Current exercise  habits: no structured exercise; worried about falling -Denies hypotensive/hypertensive symptoms -Educated on  BP goals and benefits of medications for prevention of heart attack, stroke and kidney damage; Importance of home blood pressure monitoring; Proper BP monitoring technique; Symptoms of hypotension and importance of maintaining adequate hydration; -Counseled to monitor BP at home a few times a week, document, and provide log at future appointments -Counseled on diet and exercise extensively Recommended purchasing a BP cuff and start to monitor at home.  Hyperlipidemia: (LDL goal < 70, TGs < 150) -Uncontrolled -Current treatment: Atorvastatin 40 mg 1 tablet daily - Appropriate, Query effective, Safe, Accessible -Medications previously tried: none  -Current dietary patterns: doesn't eat much fried foods -Current exercise habits: no structured exercise -Educated on Cholesterol goals;  Benefits of statin for ASCVD risk reduction; Importance of limiting foods high in cholesterol; Exercise goal of 150 minutes per week; -Counseled on diet and exercise extensively Recommended to continue current medication Recommended repeat lipid panel in 3 months after compliant with treatment.  Diabetes (A1c goal <7%) -Uncontrolled -Current medications: Tresiba inject 22 units into the skin daily  - Appropriate, Query effective, Safe, Accessible Metformin 1000 mg 1 tablet twice daily - Appropriate, Query effective, Safe, Accessible Fiasp inject once daily at dinner - Appropriate, Query effective, Safe, Accessible -Medications previously tried: Trulicity (side effects) -Current home glucose readings fasting glucose: 129, 152, 131, 132, 143, 237, 170, 154, 129, 177 post prandial glucose: 200-300 -Reports hypoglycemic/hyperglycemic symptoms -Current meal patterns: sometimes 2 meals a day; appetite varies breakfast: skips  lunch: mini subs, frozen pizza, sometimes sandwiches dinner: stir fry, baked chicken & spaghetti, pork chops snacks: was eating more snacks with higher blood sugars; nuts more now; was  eating more desserts, craves sugar  drinks: water with flavors (3 cups a day), diet drinks -Current exercise: no structured exercise right now -Educated on A1c and blood sugar goals; Complications of diabetes including kidney damage, retinal damage, and cardiovascular disease; Exercise goal of 150 minutes per week; Benefits of routine self-monitoring of blood sugar; Continuous glucose monitoring; Carbohydrate counting and/or plate method -Counseled to check feet daily and get yearly eye exams -Provided healthy plate handout. -Counseled on diet and exercise extensively Recommended addition of Fiasp 4 units before lunch.  Vitamin D deficiency (Goal: 30-100) -Uncontrolled -Current treatment  Vitamin D 50,000 units 1 capsule weekly - Appropriate, Query effective, Safe, Accessible -Medications previously tried: none  -Recommended repeat vitamin D level.  Osteoporosis (Goal prevent fractures) -Uncontrolled -Last DEXA Scan: 11/15/21   T-Score femoral neck: -3.5  T-Score total hip: n/a  T-Score lumbar spine: -2.7  T-Score forearm radius: n/a  10-year probability of major osteoporotic fracture: n/a  10-year probability of hip fracture: n/a -Patient is a candidate for pharmacologic treatment due to T-Score < -2.5 in femoral neck and T-Score < -2.5 in lumbar spine -Current treatment  Vitamin D 50,000 units once weekly - Appropriate, Effective, Safe, Accessible -Medications previously tried: none  -Recommend [phone] units of vitamin D daily. Recommend 1200 mg of calcium daily from dietary and supplemental sources. Recommend weight-bearing and muscle strengthening exercises for building and maintaining bone density. -Counseled on diet and exercise extensively Recommended supplementing with calcium citrate 500-600 mg daily in addition to 1 serving of dairy per day. Collaborated with PCP to start on osteoporosis medication.    Health Maintenance -Vaccine gaps: influenza, shingrix, tetanus,  Prevnar20 -Current therapy:  No medications -Educated on Cost vs benefit of each product must be carefully weighed by individual consumer -Patient is satisfied  with current therapy and denies issues -Recommended to continue as is  Patient Goals/Self-Care Activities Patient will:  - take medications as prescribed as evidenced by patient report and record review focus on medication adherence by using a pill box or adherence packaging. check glucose a few times a day, document, and provide at future appointments check blood pressure weekly, document, and provide at future appointments target a minimum of 150 minutes of moderate intensity exercise weekly  Follow Up Plan: The care management team will reach out to the patient again over the next 14 days.       Medication Assistance: None required.  Patient affirms current coverage meets needs.   Compliance/Adherence/Medication fill history: Care Gaps: Hep C screening, tetanus, foot exam, influenza, Prevnar20, mammogram, eye exam, colonscopy, COVID booster, shingrix, urine microalbumin  Last BP - 142/87 on 01/22/2022 Last A1C - 8.6 on 11/30/2021  Star-Rating Drugs: Atorvastatin 40 mg - last filled 12/13/2021 60 DS at [facility] Losartan 50 mg - last [facility] verified with pharm tech, Patient not taking: Reported on 09/18/2021. Metformin 1000 mg - last [facility]   Patient's preferred pharmacy is:  [facility] [place] [place] [facility] [address] [place] [address] Phone: [phone] Fax: [phone]  [facility] - [place], [place] - [address] [place] [address] Phone: [phone] Fax: [phone]  Uses pill box? No - doesn't like to keep up with one Pt endorses 80% compliance - mostly missing doses of atorvastatin  We discussed: Benefits of medication synchronization, packaging and delivery as  well as enhanced pharmacist oversight with [facility]. Patient decided to: Utilize [facility] pharmacy for medication synchronization, packaging and delivery  Care Plan and Follow Up Patient Decision:  Patient agrees to Care Plan and Follow-up.  Plan: The care management team will reach out to the patient again over the next 7 days.  Jeni Salles, PharmD, [place] Pharmacist [place] [facility] [place]

## 2022-01-30 ENCOUNTER — Other Ambulatory Visit: Payer: Self-pay

## 2022-01-30 ENCOUNTER — Other Ambulatory Visit: Payer: Self-pay | Admitting: Adult Health

## 2022-01-30 ENCOUNTER — Telehealth: Payer: Self-pay | Admitting: Pharmacist

## 2022-01-30 MED ORDER — ACCU-CHEK SOFTCLIX LANCETS MISC: 1.0000 | Freq: Three times a day (TID) | 6 refills | Status: AC

## 2022-01-30 MED ORDER — ACCU-CHEK GUIDE VI STRP: 1.0000 | ORAL_STRIP | Freq: Three times a day (TID) | 6 refills | Status: AC

## 2022-01-30 MED ORDER — FIASP FLEXTOUCH 100 UNIT/ML ~~LOC~~ SOPN: 10.0000 [IU] | PEN_INJECTOR | Freq: Two times a day (BID) | SUBCUTANEOUS | 0 refills | Status: DC

## 2022-01-30 NOTE — Addendum Note (Signed)
Addended by: Viona Gilmore on: 01/30/2022 02:39 PM   Modules accepted: Orders

## 2022-01-30 NOTE — Chronic Care Management (AMB) (Signed)
Chronic Care Management Pharmacy Assistant   Name: [name] [name]  MRN: [id number] DOB: 1950/04/08  Reason for Encounter: Medication Review / Medication Coordination Call   Recent office visits:  None  Recent consult visits:  None  Hospital visits:  None  Medications: Outpatient Encounter Medications as of 01/30/2022  Medication Sig Note   Accu-Chek Softclix Lancets lancets 1 each by Other route daily. Dx E11.9    atorvastatin (LIPITOR) 40 MG tablet TAKE ONE TABLET BY MOUTH ONCE DAILY    Blood Glucose Monitoring Suppl (ACCU-CHEK GUIDE) w/Device KIT Daily use    Cholecalciferol (VITAMIN D) 125 MCG (5000 UT) CAPS Take 50,000 Units by mouth once a week.    glucose blood (ACCU-CHEK GUIDE) test strip 1 each by Other route daily. Dx E11.9    glucose blood test strip Use to check blood sugar 1-2 times daily    insulin aspart (FIASP FLEXTOUCH) 100 UNIT/ML FlexTouch Pen Inject 10 Units into the skin in the morning and at bedtime.    Insulin Aspart, w/Niacinamide, (FIASP) 100 UNIT/ML SOLN Inject 4 Units as directed daily. Before dinner    insulin degludec (TRESIBA FLEXTOUCH) 100 UNIT/ML FlexTouch Pen Inject 22 Units into the skin daily.    Insulin Pen Needle (CAREFINE PEN NEEDLES) 32G X 4 MM MISC Use to inject insulin daily    losartan (COZAAR) 50 MG tablet Take 1 tablet (50 mg total) by mouth daily. (Patient not taking: Reported on 09/18/2021) 01/13/2022: Temp not taking   metFORMIN (GLUCOPHAGE) 1000 MG tablet TAKE 1 TABLET BY MOUTH 2 TIMES DAILY WITH A MEAL.    No facility-administered encounter medications on file as of 01/30/2022.   Reviewed chart for medication changes ahead of medication coordination call.  No OVs, Consults, or hospital visits since last care coordination call/Pharmacist visit. (If appropriate, list visit date, provider name)  No medication changes indicated OR if recent visit, treatment plan here.  BP Readings from Last 3 Encounters:  01/29/22 108/62   01/22/22 (!) 142/87  01/13/22 100/62    Lab Results  Component Value Date   HGBA1C 8.6 (A) 12/10/2021     Patient obtains medications through Adherence Packaging  30 Days    Last adherence delivery included:  Atorvastatin 40 mg 1 tablet with dinner Tresiba flex 100 units inject 10 units daily   Patient declined last month: Metformin 1000 mg 1 Tablet with lunch and dinner - [facility] Carefine pen needles 32g x 85m use daily with insulin - [facility]     Patient is due for next adherence delivery on: 02/12/2022   Called patient and reviewed medications and coordinated delivery.   This delivery to include: Atorvastatin 40 mg - 1 tablet with dinner Metformin 1000 mg - 1 Tablet with lunch and dinner Vitamin D3 1250 mcg - 1 capsule once weekly (Vial) Accu-chek Guide test strips - 3 time daily  Accu-chek softclix lancets - 3 time daily   Coordinated acute fill: No acute fills needed   Patient declined the following medications:  Tresiba flex 100 units - PAP Carefine pen needles 32g x 484muse daily with insulin PAP Fiasp Flextouch - PAP   Notes:  Patient states she was advised at her visit 01/29/22 with MaJeni Sallesclinical pharmacist,  both FiClaiborne [place] TrTyler Aasave been approved for PAP and she picked [place] her [place], she will not need this sent through [facility].  She also mentioned she is now checking blood sugars 3 times daily and will need a new prescription sent to [facility] reflecting this change.   Confirmed delivery date of 02/12/2022, advised patient that pharmacy will contact them the morning of delivery.   Care Gaps: AWV - scheduled 10/03/2022 Last BP - 100/62 on 01/13/2022 Last A1C - 8.6 on 11/30/2021 Hep C Screen - never done Tdap - overdue Foot exam - overdue Urine ACR - overdue Pneumonia - postponed Mammogram - postponed Covid - postponed Shingrix - postponed Flu -  postponed  Star Rating Drugs: Atorvastatin 40 mg - last filled 12/13/2021 60 DS at [facility] Metformin 1000 mg - last filled [facility] Losartan 50 mg - last [facility] Pt reported not taking 09/2021.  [place] Pharmacist Assistant [phone]

## 2022-02-05 NOTE — Chronic Care Management (AMB) (Signed)
Spoke with [employer] at [employer]. Patient has been approved for [place] through 02/10/2023. Order will process 02/10/2022, allow 10-14 days for shipping. Medication will ship to to the office. Patient notified.

## 2022-02-06 ENCOUNTER — Encounter: Payer: Self-pay | Admitting: Gastroenterology

## 2022-02-09 DIAGNOSIS — E785 Hyperlipidemia, unspecified: Secondary | ICD-10-CM

## 2022-02-09 DIAGNOSIS — I10 Essential (primary) hypertension: Secondary | ICD-10-CM

## 2022-02-13 DIAGNOSIS — E1165 Type 2 diabetes mellitus with hyperglycemia: Secondary | ICD-10-CM | POA: Diagnosis not present

## 2022-02-17 ENCOUNTER — Other Ambulatory Visit: Payer: Self-pay

## 2022-02-17 DIAGNOSIS — E559 Vitamin D deficiency, unspecified: Secondary | ICD-10-CM

## 2022-02-17 DIAGNOSIS — R634 Abnormal weight loss: Secondary | ICD-10-CM

## 2022-02-17 NOTE — Telephone Encounter (Signed)
Called patient to try to get income documents as [employer] is needing those prior to approving her for 2024. Patient was able to get hers but still working on son and grandson's income information. She will try to stop by tomorrow afternoon if she can with the information.   Patient is also good to call back to schedule a lab visit for her vitamin D level. She is aware that [place] signed the order so that she will review the result and act on it once it has resulted.

## 2022-02-28 ENCOUNTER — Other Ambulatory Visit: Payer: Self-pay | Admitting: Adult Health

## 2022-02-28 ENCOUNTER — Other Ambulatory Visit: Payer: Self-pay | Admitting: Internal Medicine

## 2022-03-03 ENCOUNTER — Telehealth: Payer: Self-pay | Admitting: Pharmacist

## 2022-03-03 NOTE — Progress Notes (Unsigned)
Care Management & Coordination Services Pharmacy Team   Name: [name] [name]  MRN: [id number] DOB: Apr 23, 1950  Reason for Encounter: Medication coordination and delivery  Contacted patient on 03/03/2022 to discuss medications   Recent office visits:  None  Recent consult visits:  None  Hospital visits:  None  Medications: Outpatient Encounter Medications as of 03/03/2022  Medication Sig   Accu-Chek Softclix Lancets lancets 1 each by Other route in the morning, at noon, and at bedtime. Dx E11.9   atorvastatin (LIPITOR) 40 MG tablet TAKE ONE TABLET BY MOUTH ONCE DAILY   Blood Glucose Monitoring Suppl (ACCU-CHEK GUIDE) w/Device KIT Daily use   Cholecalciferol (VITAMIN D) 125 MCG (5000 UT) CAPS Take 50,000 Units by mouth once a week.   FIASP FLEXTOUCH 100 UNIT/ML FlexTouch Pen Inject 10 Units into THE SKIN in THE morning AND AT bedtime.   glucose blood (ACCU-CHEK GUIDE) test strip 1 each by Other route in the morning, at noon, and at bedtime. Dx E11.9   glucose blood test strip Use to check blood sugar 1-2 times daily   insulin degludec (TRESIBA FLEXTOUCH) 100 UNIT/ML FlexTouch Pen Inject 22 Units into the skin daily.   Insulin Pen Needle (CAREFINE PEN NEEDLES) 32G X 4 MM MISC Use to inject insulin daily   metFORMIN (GLUCOPHAGE) 1000 MG tablet TAKE 1 TABLET BY MOUTH 2 TIMES DAILY WITH A MEAL.   No facility-administered encounter medications on file as of 03/03/2022.   BP Readings from Last 3 Encounters:  01/29/22 108/62  01/22/22 (!) 142/87  01/13/22 100/62    Pulse Readings from Last 3 Encounters:  01/22/22 99  01/13/22 (!) 120  08/27/21 96    Lab Results  Component Value Date/Time   HGBA1C 8.6 (A) 12/10/2021 02:13 PM   HGBA1C 11.1 (A) 08/27/2021 11:39 AM   HGBA1C 11.0 (H) 11/05/2020 11:16 AM   HGBA1C 12.3 (H) 06/20/2020 09:39 AM   Lab Results  Component Value Date   CREATININE 0.57 01/13/2022   BUN 10 01/13/2022   GFR 92.57 01/13/2022   GFRNONAA >60 08/27/2021    GFRAA 96 02/23/2007   NA 136 01/13/2022   K 4.2 01/13/2022   CALCIUM 9.0 01/13/2022   CO2 27 01/13/2022    Last adherence delivery date:02/12/2022      Patient is due for next adherence delivery on: 03/13/2022  {Med Review:25223}  This delivery to include: Adherence Packaging  30 Days  Atorvastatin 40 mg - 1 tablet with dinner Metformin 1000 mg - 1 Tablet with lunch and dinner Accu-chek Guide test strips - 3 time daily Accu-chek softclix lancets - 3 time daily Vitamin D3 1250 mcg - 1 capsule once weekly (Vial) Fiasp Flextouch 100 units - inj. 10 units twice daily (should be pap)  Patient declined the following medications this month: Carefine pen needles 32g x 52m use daily with insulin PAP Fiasp Flextouch - PAP (should have been sent to the office) TTyler Aasflex 100 units - PAP (should have been sent to the office)  {Delivery dHKVQ:25956} Any concerns about your medications? {yes/no:20286}  How often do you forget or accidentally miss a dose? {Missed doses:25554}  Is patient in packaging Yes   Recent blood pressure readings are as follows:***  Recent blood glucose readings are as follows:***  Cycle dispensing form sent to *** for review.  Care Gaps: AWV - scheduled 10/03/2022 Last BP - 100/62 on 01/13/2022 Last A1C - 8.6 on 11/30/2021 Hep C Screen - never done Shingrix - never done Tdap -  overdue Pneumovax - overdue Covid - overdue Mammogram - overdue Foot exam - overdue Urine ACR - overdue Flu - postponed  Star Rating Drugs: Atorvastatin 40 mg - last filled 02/07/2022 30 DS at [facility] Metformin 1000 mg - last filled 02/07/2022 30 DS at [facility] Pharmacist Assistant [phone]

## 2022-03-04 ENCOUNTER — Telehealth: Payer: Self-pay

## 2022-03-04 ENCOUNTER — Other Ambulatory Visit (INDEPENDENT_AMBULATORY_CARE_PROVIDER_SITE_OTHER): Payer: Medicare HMO

## 2022-03-04 DIAGNOSIS — E559 Vitamin D deficiency, unspecified: Secondary | ICD-10-CM

## 2022-03-04 NOTE — Telephone Encounter (Signed)
Pt assistance medication came in today. Called pt to advised that medication was ready for pick up. Vm has not been set up. Medication placed in mini fridge. Will route to CMA for f/u.

## 2022-03-05 ENCOUNTER — Other Ambulatory Visit: Payer: Self-pay | Admitting: Family

## 2022-03-05 LAB — VITAMIN D 25 HYDROXY (VIT D DEFICIENCY, FRACTURES): VITD: 24.21 ng/mL — ABNORMAL LOW (ref 30.00–100.00)

## 2022-03-07 NOTE — Telephone Encounter (Signed)
Pt reports she was seen on Tuesday and pick up the insulin medication from the fridge.

## 2022-04-02 ENCOUNTER — Telehealth: Payer: Self-pay

## 2022-04-02 NOTE — Progress Notes (Signed)
Care Management & Coordination Services Pharmacy Team  Reason for Encounter: Medication coordination and delivery  Contacted patient to discuss medications and coordinate delivery from [facility] pharmacy. Unsuccessful outreach. Left voicemail for patient to return call. Multiple attempts.   Cycle dispensing form sent to Burman Riis for review.   Last adherence delivery date:03/13/2022      Patient is due for next adherence delivery on: 04/14/2022  This delivery to include: Vials  30 Days  Atorvastatin 40 mg - 1 tablet with dinner Metformin 1000 mg - 1 tablet with lunch and dinner Vitamin D3 1250 mcg - 1 capsule once weekly (Vial)  Patient declined the following medications this month: No medications declined  Medication filled by patient assistance: Fiasp Flextouch - filled through patient assistance  Tresiba flex 100 units - filled through patient assistance Carefine pen needles 32g x 55m filled through patient assistance  Delivery scheduled for 04/14/2022. Unable to speak with patient to confirm date.   Any concerns about your medications?   How often do you forget or accidentally miss a dose?   Do you use a pillbox?   Is patient in packaging No   Recent blood pressure readings are as follows:  Recent blood glucose readings are as follows:   Contacted patient for general health update and medication adherence call.  Unsuccessful outreach. Left voicemail for patient to return call.  What concerns do you have about your medications?  The patient  side effects with their medications.   How often do you forget or accidentally miss a dose?   Are you having any problems getting your medications from your pharmacy?   Has the cost of your medications been a concern?  If yes, what medication and is patient assistance available or has it been applied for?  Since last visit with PharmD, no interventions have been made.   The patient has not had an ED visit since  last contact.   The patient  problems with their health.   Patient  concerns or questions for , PharmD at this time.   Chart review: Recent office visits:  None  Recent consult visits:  None  Hospital visits:  None  Medications: Outpatient Encounter Medications as of 04/02/2022  Medication Sig   Accu-Chek Softclix Lancets lancets 1 each by Other route in the morning, at noon, and at bedtime. Dx E11.9   atorvastatin (LIPITOR) 40 MG tablet TAKE ONE TABLET BY MOUTH ONCE DAILY   Blood Glucose Monitoring Suppl (ACCU-CHEK GUIDE) w/Device KIT Daily use   Cholecalciferol (VITAMIN D3) 1.25 MG (50000 UT) CAPS TAKE ONE CAPSULE BY MOUTH ONCE A WEEK   glucose blood (ACCU-CHEK GUIDE) test strip 1 each by Other route in the morning, at noon, and at bedtime. Dx E11.9   glucose blood test strip Use to check blood sugar 1-2 times daily   insulin degludec (TRESIBA FLEXTOUCH) 100 UNIT/ML FlexTouch Pen Inject 22 Units into the skin daily.   Insulin Pen Needle (CAREFINE PEN NEEDLES) 32G X 4 MM MISC Use to inject insulin daily   metFORMIN (GLUCOPHAGE) 1000 MG tablet TAKE 1 TABLET BY MOUTH 2 TIMES DAILY WITH A MEAL.   No facility-administered encounter medications on file as of 04/02/2022.   BP Readings from Last 3 Encounters:  01/29/22 108/62  01/22/22 (!) 142/87  01/13/22 100/62    Pulse Readings from Last 3 Encounters:  01/22/22 99  01/13/22 (!) 120  08/27/21 96    Lab Results  Component Value Date/Time   HGBA1C 8.6 (  A) 12/10/2021 02:13 PM   HGBA1C 11.1 (A) 08/27/2021 11:39 AM   HGBA1C 11.0 (H) 11/05/2020 11:16 AM   HGBA1C 12.3 (H) 06/20/2020 09:39 AM   Lab Results  Component Value Date   CREATININE 0.57 01/13/2022   BUN 10 01/13/2022   GFR 92.57 01/13/2022   GFRNONAA >60 08/27/2021   GFRAA 96 02/23/2007   NA 136 01/13/2022   K 4.2 01/13/2022   CALCIUM 9.0 01/13/2022   CO2 27 01/13/2022   [place]  Clinical Pharmacist Assistant [phone]

## 2022-04-03 ENCOUNTER — Telehealth: Payer: Self-pay

## 2022-04-03 NOTE — Telephone Encounter (Signed)
Received [name] [name] from Patient Assistant Program.  Attempt to reach pt and let her know. Voicemail has not been set up yet.

## 2022-04-07 NOTE — Telephone Encounter (Signed)
Spoke to patient. Pt is aware her [name] [name] and [name] [name] are ready to pick up. Verbalized understanding.   Pt reports she received individual vials for Fiasp? And that it is incorrect. Will bring it tomorrow when pick up the medication.

## 2022-04-10 NOTE — Telephone Encounter (Signed)
Patient pick up it on Monday and brought the Fiasp vial. Brought it up to pharmacist, Levada Dy regarding pt on [place] and the vials. She states she will reach to patient.

## 2022-04-30 ENCOUNTER — Other Ambulatory Visit: Payer: Self-pay | Admitting: Internal Medicine

## 2022-05-01 ENCOUNTER — Telehealth: Payer: Self-pay

## 2022-05-01 NOTE — Progress Notes (Signed)
Care Management & Coordination Services Pharmacy Team  Reason for Encounter: Medication coordination and delivery  Contacted patient to discuss medications and coordinate delivery from [facility]. Unsuccessful outreach. Left voicemail for patient to return call. Multiple calls  Cycle dispensing form sent to Burman Riis for review.   Last adherence delivery date: 04/14/2022      Patient is due for next adherence delivery on: 05/13/2022  This delivery to include: Vials  30 Days  Atorvastatin 40 mg - 1 tablet with dinner Metformin 1000 mg - 1 tablet with lunch and dinner Vitamin D3 1250 mcg - 1 capsule once weekly   Patient declined the following medications this month: No medications declined   Medication filled by patient assistance: Fiasp Flextouch - filled through patient assistance  Tresiba flex 100 units - filled through patient assistance Carefine pen needles 32g x 16mm filled through patient assistance  Delivery scheduled for 05/13/2022. Unable to speak with patient to confirm date.   Any concerns about your medications?   How often do you forget or accidentally miss a dose?   Do you use a pillbox?   Is patient in packaging   If yes  What is the date on your next pill pack?  Any concerns or issues with your packaging?  Recent blood pressure readings are as follows:  Recent blood glucose readings are as follows:   Chart review: Recent office visits:  None  Recent consult visits:  None  Hospital visits:  None  Medications: Outpatient Encounter Medications as of 05/01/2022  Medication Sig   Accu-Chek Softclix Lancets lancets 1 each by Other route in the morning, at noon, and at bedtime. Dx E11.9   atorvastatin (LIPITOR) 40 MG tablet TAKE ONE TABLET BY MOUTH ONCE DAILY   Blood Glucose Monitoring Suppl (ACCU-CHEK GUIDE) w/Device KIT Daily use   Cholecalciferol (VITAMIN D3) 1.25 MG (50000 UT) CAPS TAKE ONE CAPSULE BY MOUTH ONCE A WEEK   glucose  blood (ACCU-CHEK GUIDE) test strip 1 each by Other route in the morning, at noon, and at bedtime. Dx E11.9   glucose blood test strip Use to check blood sugar 1-2 times daily   insulin degludec (TRESIBA FLEXTOUCH) 100 UNIT/ML FlexTouch Pen Inject 22 Units into the skin daily.   Insulin Pen Needle (CAREFINE PEN NEEDLES) 32G X 4 MM MISC Use to inject insulin daily   metFORMIN (GLUCOPHAGE) 1000 MG tablet TAKE ONE TABLET BY MOUTH AT NOON and TAKE ONE TABLET BY MOUTH EVERY EVENING   No facility-administered encounter medications on file as of 05/01/2022.   BP Readings from Last 3 Encounters:  01/29/22 108/62  01/22/22 (!) 142/87  01/13/22 100/62    Pulse Readings from Last 3 Encounters:  01/22/22 99  01/13/22 (!) 120  08/27/21 96    Lab Results  Component Value Date/Time   HGBA1C 8.6 (A) 12/10/2021 02:13 PM   HGBA1C 11.1 (A) 08/27/2021 11:39 AM   HGBA1C 11.0 (H) 11/05/2020 11:16 AM   HGBA1C 12.3 (H) 06/20/2020 09:39 AM   Lab Results  Component Value Date   CREATININE 0.57 01/13/2022   BUN 10 01/13/2022   GFR 92.57 01/13/2022   GFRNONAA >60 08/27/2021   GFRAA 96 02/23/2007   NA 136 01/13/2022   K 4.2 01/13/2022   CALCIUM 9.0 01/13/2022   CO2 27 01/13/2022   [place]  Clinical Pharmacist Assistant [phone]

## 2022-05-08 ENCOUNTER — Telehealth: Payer: Self-pay | Admitting: Internal Medicine

## 2022-05-08 NOTE — Telephone Encounter (Signed)
Juliann Pulse from [employer] called stating that the patient's insurance has denied to refill her insulin pen needles because the directions are wrong. The current directions state to inject once a day, however patient states she uses then 2-3 times a day.

## 2022-05-17 ENCOUNTER — Other Ambulatory Visit: Payer: Self-pay | Admitting: Internal Medicine

## 2022-05-20 NOTE — Telephone Encounter (Signed)
In office appt sch to review sugars and medications on 05/23/22

## 2022-05-22 NOTE — Progress Notes (Signed)
  Care Management & Coordination Services Pharmacy Note  05/22/2022 Name:  [name] [name] MRN:  [id number] DOB:  1950/02/19  Summary: -A1C not at goal <7 (8.6 at last check in October), reports seeing no decrease in sugars with Fiasp use -GMI for last 2 weeks with Dexcom sensor shows predicted sugar control of 9.0%   Recommendations/Changes made from today's visit: INCREASE Fiasp to 8 units BID, 15 min before meals START Farxiga 5mg  (2 weeks of samples provided in office)  Follow up plan: 1 week to assess toleration of farxiga   Subjective: [name] [name] is an 72 y.o. year old female who is a primary patient of Panosh, Neta Mends, MD.  The care coordination team was consulted for assistance with disease management and care coordination needs.    Engaged with patient face to face for follow up visit.   Objective:  Lab Results  Component Value Date   CREATININE 0.57 01/13/2022   BUN 10 01/13/2022   GFR 92.57 01/13/2022   GFRNONAA >60 08/27/2021   GFRAA 96 02/23/2007   NA 136 01/13/2022   K 4.2 01/13/2022   CALCIUM 9.0 01/13/2022   CO2 27 01/13/2022   GLUCOSE 331 (H) 01/13/2022    Lab Results  Component Value Date/Time   HGBA1C 8.6 (A) 12/10/2021 02:13 PM   HGBA1C 11.1 (A) 08/27/2021 11:39 AM   HGBA1C 11.0 (H) 11/05/2020 11:16 AM   HGBA1C 12.3 (H) 06/20/2020 09:39 AM   GFR 92.57 01/13/2022 03:38 PM   GFR 92.82 08/27/2021 12:13 PM   MICROALBUR <0.7 06/20/2020 09:39 AM   MICROALBUR <0.7 01/04/2019 10:05 AM    Last diabetic Eye exam:  Lab Results  Component Value Date/Time   HMDIABEYEEXA No Retinopathy 12/13/2021 12:00 AM    Last diabetic Foot exam:  Lab Results  Component Value Date/Time   HMDIABFOOTEX Done 05/25/2009 12:00 AM    Assessment/Plan   Diabetes (A1c goal <7%) -Uncontrolled -Current medications: Fiasp 6 units BID before a meal Appropriate, Query Effective Tresiba 22 units qd Appropriate, Effective, Safe, Accessible Meformin 1000mg  1 BID  Appropriate, Effective, Safe, Accessible -Medications previously tried: Science writer, Jardiance, Glimepiride, Victoza, Onglyza, Actos -Current home glucose readings  -Denies hypoglycemic/hyperglycemic symptoms -Current meal patterns:  Eats 2 meals a day and tries to be mindful of carb intake Reports seeing little to no impact from fiasp use -Current exercise: Not discussed -Educated on A1c and blood sugar goals; Complications of diabetes including kidney damage, retinal damage, and cardiovascular disease; Proper insulin injection technique; Prevention and management of hypoglycemic episodes; Continuous glucose monitoring; -Counseled to check feet daily and get yearly eye exams -Recommended increasing fiasp to 8 units BID, START Farxiga 5mg  once daily x 2 weeks  Sherrill Raring Clinical Pharmacist [phone]

## 2022-05-23 ENCOUNTER — Ambulatory Visit: Payer: Medicare HMO

## 2022-05-26 ENCOUNTER — Other Ambulatory Visit: Payer: Self-pay

## 2022-05-26 MED ORDER — DAPAGLIFLOZIN PROPANEDIOL 5 MG PO TABS: 5.0000 mg | ORAL_TABLET | Freq: Every day | ORAL | 0 refills | Status: DC

## 2022-05-30 ENCOUNTER — Ambulatory Visit: Payer: Medicare HMO

## 2022-05-30 ENCOUNTER — Other Ambulatory Visit: Payer: Self-pay | Admitting: Internal Medicine

## 2022-05-30 ENCOUNTER — Telehealth: Payer: Self-pay

## 2022-05-30 NOTE — Telephone Encounter (Signed)
2nd attempt to reach pt. Voicemail has not set up.

## 2022-05-30 NOTE — Telephone Encounter (Signed)
Patient was informed and plans to pick up 06/02/22.

## 2022-05-30 NOTE — Progress Notes (Signed)
  Care Management & Coordination Services Pharmacy Note  05/30/2022 Name:  [name] [name] MRN:  [id number] DOB:  07-20-1950  Summary: [name] of low sugars in the low 60s each morning since starting Farxiga Reports improved daytime BG readings (less than 250, had been as high as 350 prior to farxiga)  Recommendations/Changes made from today's visit: Continue Farxiga  once daily on samples while we wait for PAP approval Continue Fiasp  twice daily with meals Decrease tresiba to 20 units in the evening  Follow up plan: 06/20/22 in office pharmacist appt to review Dexcom log   Subjective: [name] [name] is an 72 y.o. year old female who is a primary patient of Panosh, Neta Mends, MD.  The care coordination team was consulted for assistance with disease management and care coordination needs.    Engaged with patient by telephone for follow up visit - focused diabetes review. See summary above.    Sherrill Raring Clinical Pharmacist [phone]

## 2022-05-30 NOTE — Telephone Encounter (Signed)
Attempted to reach pt to inform her that we received NOVOfine 32G tip supply from Patient Assistant Program. Unable to leave voicemail as it has not been set up.

## 2022-06-02 ENCOUNTER — Telehealth: Payer: Self-pay

## 2022-06-02 NOTE — Progress Notes (Signed)
Care Management & Coordination Services Pharmacy Team  Reason for Encounter: Medication coordination and delivery  Contacted patient to discuss medications and coordinate delivery from [facility] pharmacy. {US HC Outreach:28874}  Cycle dispensing form sent to *** for review.   Last adherence delivery date:05/13/2022      Patient is due for next adherence delivery on: 06/11/2022  This delivery to include: Vials  30 Days  Atorvastatin 40 mg - 1 tablet with dinner Metformin 1000 mg - 1 tablet with lunch and dinner Vitamin D3 1250 mcg - 1 capsule once weekly   Patient declined the following medications this month: No medications declined   Medication filled by patient assistance: Fiasp Flextouch - filled through patient assistance  Tresiba flex 100 units - filled through patient assistance Carefine pen needles 32g x 4mm filled through patient assistance  {Delivery ZOXW:96045}  Any concerns about your medications? {yes/no:20286}  How often do you forget or accidentally miss a dose? {Missed doses:25554}  Do you use a pillbox? {yes/no:20286}  Recent blood pressure readings are as follows:***  Recent blood glucose readings are as follows:***   Chart review: Recent office visits:  None  Recent consult visits:  None  Hospital visits:  None  Medications: Outpatient Encounter Medications as of 06/02/2022  Medication Sig   Accu-Chek Softclix Lancets lancets 1 each by Other route in the morning, at noon, and at bedtime. Dx E11.9   atorvastatin (LIPITOR) 40 MG tablet TAKE ONE TABLET BY MOUTH ONCE DAILY   BD PEN NEEDLE NANO 2ND GEN 32G X 4 MM MISC USE TO INJECT INSULIN DAILY   Blood Glucose Monitoring Suppl (ACCU-CHEK GUIDE) w/Device KIT Daily use   Cholecalciferol (VITAMIN D3) 1.25 MG (50000 UT) CAPS TAKE ONE CAPSULE BY MOUTH ONCE A WEEK   dapagliflozin propanediol (FARXIGA) 5 MG TABS tablet Take 1 tablet (5 mg total) by mouth daily before breakfast.   FIASP FLEXTOUCH 100  UNIT/ML FlexTouch Pen Inject 6 Units into the skin. Once or twice a day   glucose blood (ACCU-CHEK GUIDE) test strip 1 each by Other route in the morning, at noon, and at bedtime. Dx E11.9   glucose blood test strip Use to check blood sugar 1-2 times daily   insulin degludec (TRESIBA FLEXTOUCH) 100 UNIT/ML FlexTouch Pen Inject 22 Units into the skin daily.   metFORMIN (GLUCOPHAGE) 1000 MG tablet TAKE ONE TABLET BY MOUTH AT NOON and TAKE ONE TABLET BY MOUTH EVERY EVENING   No facility-administered encounter medications on file as of 06/02/2022.   BP Readings from Last 3 Encounters:  01/29/22 108/62  01/22/22 (!) 142/87  01/13/22 100/62    Pulse Readings from Last 3 Encounters:  01/22/22 99  01/13/22 (!) 120  08/27/21 96    Lab Results  Component Value Date/Time   HGBA1C 8.6 (A) 12/10/2021 02:13 PM   HGBA1C 11.1 (A) 08/27/2021 11:39 AM   HGBA1C 11.0 (H) 11/05/2020 11:16 AM   HGBA1C 12.3 (H) 06/20/2020 09:39 AM   Lab Results  Component Value Date   CREATININE 0.57 01/13/2022   BUN 10 01/13/2022   GFR 92.57 01/13/2022   GFRNONAA >60 08/27/2021   GFRAA 96 02/23/2007   NA 136 01/13/2022   K 4.2 01/13/2022   CALCIUM 9.0 01/13/2022   CO2 27 01/13/2022   Inetta Fermo CMA  Clinical Pharmacist Assistant [phone]

## 2022-06-11 ENCOUNTER — Other Ambulatory Visit: Payer: Self-pay | Admitting: Family

## 2022-06-11 MED ORDER — DAPAGLIFLOZIN PROPANEDIOL 5 MG PO TABS: 5.0000 mg | ORAL_TABLET | Freq: Every day | ORAL | 0 refills | Status: DC

## 2022-06-19 ENCOUNTER — Other Ambulatory Visit: Payer: Self-pay

## 2022-06-19 ENCOUNTER — Telehealth: Payer: Self-pay

## 2022-06-19 MED ORDER — DAPAGLIFLOZIN PROPANEDIOL 5 MG PO TABS: 5.0000 mg | ORAL_TABLET | Freq: Every day | ORAL | 0 refills | Status: DC

## 2022-06-19 NOTE — Progress Notes (Signed)
Care Management & Coordination Services Pharmacy Team  Reason for Encounter: Appointment Reminder  Contacted patient to confirm in office appointment with Delano Metz, PharmD on 06/20/2022 at 3:30. Unsuccessful outreach. Unable to leave voicemail.  Do you have any problems getting your medications?   What is your top health concern you would like to discuss at your upcoming visit?   Have you seen any other providers since your last visit with PCP?   Care Gaps: AWV - completed 10/01/2021 Last eye exam - 12/13/2021 Last foot exam - 06/18/2020 Last BP - 100/62 on 01/13/2022 Last A1C - 8.6 on 12/10/2021   Star Rating Drugs: Atorvastatin 40 mg - last filled 06/09/2022 30 DS at [facility] Farxiga 5 mg - no fill history Metformin 1000 mg - last filled 06/09/2022 30 DS at [facility]   Inetta Fermo [facility]  Clinical Pharmacist Assistant [phone]

## 2022-06-20 ENCOUNTER — Ambulatory Visit: Payer: Medicare HMO

## 2022-06-20 NOTE — Progress Notes (Cosign Needed Addendum)
  Care Management & Coordination Services Pharmacy Note  06/20/2022 Name:  [name] [name] MRN:  [id number] DOB:  26-Apr-1950  Summary: Still complaining of low sugars, denies any readings >200 2 hours post-prandial  Recommendations/Changes made from today's visit: Approved for Farxiga 5mg  PAP Continue Fiasp 8mg  twice daily with meals Decrease tresiba to 14 units in the evening Future consideration: Increase to [place] 10mg  at next visit and further decrease insulin regimen.  Follow up plan: 07/01/22 PCP appt - due for updated A1C check 1 month DM review   Subjective: [name] [name] is an 72 y.o. year old female who is a primary patient of Panosh, Neta Mends, MD.  The care coordination team was consulted for assistance with disease management and care coordination needs.    Engaged with patient by telephone for follow up visit - focused diabetes review. See summary above.    Sherrill Raring Clinical Pharmacist [phone]

## 2022-07-01 ENCOUNTER — Encounter: Payer: Self-pay | Admitting: Internal Medicine

## 2022-07-01 ENCOUNTER — Ambulatory Visit (INDEPENDENT_AMBULATORY_CARE_PROVIDER_SITE_OTHER): Payer: Medicare HMO | Admitting: Internal Medicine

## 2022-07-01 VITALS — BP 116/68 | HR 117 | Temp 98.6°F | Ht 63.0 in | Wt 124.2 lb

## 2022-07-01 DIAGNOSIS — Z23 Encounter for immunization: Secondary | ICD-10-CM

## 2022-07-01 DIAGNOSIS — E1165 Type 2 diabetes mellitus with hyperglycemia: Secondary | ICD-10-CM | POA: Diagnosis not present

## 2022-07-01 DIAGNOSIS — H547 Unspecified visual loss: Secondary | ICD-10-CM

## 2022-07-01 DIAGNOSIS — I10 Essential (primary) hypertension: Secondary | ICD-10-CM | POA: Diagnosis not present

## 2022-07-01 DIAGNOSIS — Z79899 Other long term (current) drug therapy: Secondary | ICD-10-CM

## 2022-07-01 DIAGNOSIS — M79672 Pain in left foot: Secondary | ICD-10-CM | POA: Diagnosis not present

## 2022-07-01 DIAGNOSIS — M79671 Pain in right foot: Secondary | ICD-10-CM

## 2022-07-01 DIAGNOSIS — Z794 Long term (current) use of insulin: Secondary | ICD-10-CM | POA: Diagnosis not present

## 2022-07-01 DIAGNOSIS — M810 Age-related osteoporosis without current pathological fracture: Secondary | ICD-10-CM

## 2022-07-01 DIAGNOSIS — R634 Abnormal weight loss: Secondary | ICD-10-CM | POA: Diagnosis not present

## 2022-07-01 LAB — HEPATIC FUNCTION PANEL
ALT: 10 U/L (ref 0–35)
AST: 15 U/L (ref 0–37)
Albumin: 4 g/dL (ref 3.5–5.2)
Alkaline Phosphatase: 68 U/L (ref 39–117)
Bilirubin, Direct: 0.1 mg/dL (ref 0.0–0.3)
Total Bilirubin: 0.5 mg/dL (ref 0.2–1.2)
Total Protein: 6.6 g/dL (ref 6.0–8.3)

## 2022-07-01 LAB — VITAMIN D 25 HYDROXY (VIT D DEFICIENCY, FRACTURES): VITD: 70.39 ng/mL (ref 30.00–100.00)

## 2022-07-01 LAB — CBC WITH DIFFERENTIAL/PLATELET
Basophils Absolute: 0.1 10*3/uL (ref 0.0–0.1)
Basophils Relative: 1.2 % (ref 0.0–3.0)
Eosinophils Absolute: 0.1 10*3/uL (ref 0.0–0.7)
Eosinophils Relative: 1.4 % (ref 0.0–5.0)
HCT: 44.8 % (ref 36.0–46.0)
Hemoglobin: 15 g/dL (ref 12.0–15.0)
Lymphocytes Relative: 29.4 % (ref 12.0–46.0)
Lymphs Abs: 2.1 10*3/uL (ref 0.7–4.0)
MCHC: 33.5 g/dL (ref 30.0–36.0)
MCV: 89.5 fl (ref 78.0–100.0)
Monocytes Absolute: 0.6 10*3/uL (ref 0.1–1.0)
Monocytes Relative: 8.1 % (ref 3.0–12.0)
Neutro Abs: 4.2 10*3/uL (ref 1.4–7.7)
Neutrophils Relative %: 59.9 % (ref 43.0–77.0)
Platelets: 368 10*3/uL (ref 150.0–400.0)
RBC: 5 Mil/uL (ref 3.87–5.11)
RDW: 13.6 % (ref 11.5–15.5)
WBC: 7.1 10*3/uL (ref 4.0–10.5)

## 2022-07-01 LAB — LIPID PANEL
Cholesterol: 182 mg/dL (ref 0–200)
HDL: 75.1 mg/dL (ref >39.00)
LDL Cholesterol: 84 mg/dL (ref 0–99)
NonHDL: 106.41
Total CHOL/HDL Ratio: 2
Triglycerides: 111 mg/dL (ref 0.0–149.0)
VLDL: 22.2 mg/dL (ref 0.0–40.0)

## 2022-07-01 LAB — POCT GLYCOSYLATED HEMOGLOBIN (HGB A1C): Hemoglobin A1C: 7.1 % — AB (ref 4.0–5.6)

## 2022-07-01 LAB — BASIC METABOLIC PANEL
BUN: 14 mg/dL (ref 6–23)
CO2: 28 mEq/L (ref 19–32)
Calcium: 9.6 mg/dL (ref 8.4–10.5)
Chloride: 101 mEq/L (ref 96–112)
Creatinine, Ser: 0.69 mg/dL (ref 0.40–1.20)
GFR: 88.12 mL/min (ref >60.00)
Glucose, Bld: 197 mg/dL — ABNORMAL HIGH (ref 70–99)
Potassium: 4.5 mEq/L (ref 3.5–5.1)
Sodium: 137 mEq/L (ref 135–145)

## 2022-07-01 LAB — MICROALBUMIN / CREATININE URINE RATIO
Creatinine,U: 55.2 mg/dL
Microalb Creat Ratio: 2.3 mg/g (ref 0.0–30.0)
Microalb, Ur: 1.3 mg/dL (ref 0.0–1.9)

## 2022-07-01 LAB — TSH: TSH: 1.23 u[IU]/mL (ref 0.35–5.50)

## 2022-07-01 LAB — VITAMIN B12: Vitamin B-12: 239 pg/mL (ref 211–911)

## 2022-07-01 MED ORDER — DEXCOM G7 SENSOR MISC: 1.0000 | 0 refills | Status: DC

## 2022-07-01 NOTE — Progress Notes (Signed)
Chief Complaint  Patient presents with   Medical Management of Chronic Issues    Follow up on DM    HPI: [name] [name] 72 y.o. come in for Chronic disease management  Out of control dm and weight loss: see last year notes  under care coordination  as  transport has been difficut to get to office  but doing better  Did see gi 12 23  Advised to have  panendoscopy colon schowed pre cancer polyps doing ok  On farxiga atorva and metformin 1000bid  treseiba 14 and Fiasp 6-8 untils bid depending on meals on high dose rx vit d last level around 22  Now on rx med Vision is down  grandson  lives in as driver. But he will be going to school in Jan . And not available  Has cataracts and plan was to have surgery for better vision  plans to get back with eye team. Feet ache hurt at night but no claudication and no numbness or lesions  Asks for extra sample of dexcom because of 3 mos cost   No recent hypoglycmeia since tresiba dec to 14 per day .  ROS: See pertinent positives and negatives per HPI. Has clear runny nose all the time without illness or other sx   Past Medical History:  Diagnosis Date   Allergic rhinitis    Anxiety    Diabetes mellitus    Gallstones    Hyperlipidemia    Hypertension    IBS (irritable bowel syndrome)    Normal nuclear stress test 02/10/2006   low risk study    Obesity    Sciatica     Family History  Problem Relation Age of Onset   Acute myelogenous leukemia Mother        died when pat age 30    Diabetes Father    Lung cancer Maternal Grandmother    Skin cancer Maternal Grandfather    Breast cancer Paternal Grandmother    Diabetes Paternal Grandfather     Social History   Socioeconomic History   Marital status: Divorced    Spouse name: Not on file   Number of children: 3   Years of education: Not on file   Highest education level: Not on file  Occupational History   Occupation: retired  Tobacco Use   Smoking status: Never   Smokeless  tobacco: Never  Substance and Sexual Activity   Alcohol use: No   Drug use: No   Sexual activity: Not on file  Other Topics Concern   Not on file  Social History Narrative   Separated single mom   HH of 4   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          Social Determinants of Health   Financial Resource Strain: Low Risk  (10/01/2021)   Overall Financial Resource Strain (CARDIA)    Difficulty of Paying Living Expenses: Not hard at all  Food Insecurity: No Food Insecurity (05/23/2022)   Hunger Vital Sign    Worried About Running Out of Food in the Last Year: Never true    Ran Out of Food in the Last Year: Never true  Transportation Needs: No Transportation Needs (10/01/2021)   PRAPARE -  Administrator, Civil Service (Medical): No    Lack of Transportation (Non-Medical): No  Physical Activity: Inactive (10/01/2021)   Exercise Vital Sign    Days of Exercise per Week: 0 days    Minutes of Exercise per Session: 0 min  Stress: No Stress Concern Present (10/01/2021)   [employer] of Occupational Health - Occupational Stress Questionnaire    Feeling of Stress : Not at all  Social Connections: Socially Isolated (10/01/2021)   Social Connection and Isolation Panel [NHANES]    Frequency of Communication with Friends and Family: More than three times a week    Frequency of Social Gatherings with Friends and Family: More than three times a week    Attends Religious Services: Never    Database administrator or Organizations: No    Attends Banker Meetings: Never    Marital Status: Divorced    Outpatient Medications Prior to Visit  Medication Sig Dispense Refill   Accu-Chek Softclix Lancets lancets 1 each by Other route in the morning, at noon, and at bedtime. Dx E11.9 100 each 6    atorvastatin (LIPITOR) 40 MG tablet TAKE ONE TABLET BY MOUTH ONCE DAILY 90 tablet 1   BD PEN NEEDLE NANO 2ND GEN 32G X 4 MM MISC USE TO INJECT INSULIN DAILY 100 each 5   Blood Glucose Monitoring Suppl (ACCU-CHEK GUIDE) w/Device KIT Daily use 1 kit 0   Cholecalciferol (VITAMIN D3) 1.25 MG (50000 UT) CAPS TAKE ONE CAPSULE BY MOUTH ONCE A WEEK 12 capsule 2   dapagliflozin propanediol (FARXIGA) 5 MG TABS tablet Take 1 tablet (5 mg total) by mouth daily before breakfast. 14 tablet 0   FIASP FLEXTOUCH 100 UNIT/ML FlexTouch Pen Inject 6 Units into the skin. Once or twice a day     glucose blood (ACCU-CHEK GUIDE) test strip 1 each by Other route in the morning, at noon, and at bedtime. Dx E11.9 100 each 6   glucose blood test strip Use to check blood sugar 1-2 times daily 100 each 3   insulin degludec (TRESIBA FLEXTOUCH) 100 UNIT/ML FlexTouch Pen Inject 22 Units into the skin daily. 3 mL 0   metFORMIN (GLUCOPHAGE) 1000 MG tablet TAKE ONE TABLET BY MOUTH AT NOON and TAKE ONE TABLET BY MOUTH EVERY EVENING 180 tablet 3   No facility-administered medications prior to visit.     EXAM:  BP 116/68 (BP Location: Left Arm, Patient Position: Sitting, Cuff Size: Normal)   Pulse (!) 117   Temp 98.6 F (37 C) (Oral)   Ht 5\' 3"  (1.6 m)   Wt 124 lb 3.2 oz (56.3 kg)   SpO2 95%   BMI 22.00 kg/m   Body mass index is 22 kg/m. Last Weight  Most recent update: 07/01/2022  1:46 PM    Weight  56.3 kg (124 lb 3.2 oz)            Wt Readings from Last 3 Encounters:  07/01/22 124 lb 3.2 oz (56.3 kg)  01/22/22 116 lb (52.6 kg)  01/13/22 116 lb 4 oz (52.7 kg)   Says HR goes  up only at med visits   GENERAL: vitals reviewed and listed above, alert, oriented, appears well hydrated and in no acute distress HEENT: atraumatic, conjunctiva  clear, no obvious abnormalities on inspection of external nose and ears  NECK: no obvious masses on inspection palpation  LUNGS: clear to auscultation bilaterally, no wheezes,  rales or rhonchi, good air movement CV: HRRR, no  clubbing cyanosis or  peripheral edema nl cap refill  distal feet some discoloration but no callus  or ulcer   Abdomen:  Sof,t normal bowel sounds without hepatosplenomegaly, no guarding rebound or masses no CVA tenderness MS: moves all extremities without noticeable focal  abnormality PSYCH: pleasant and cooperative, no obvious depression or anxiety Lab Results  Component Value Date   WBC 6.9 01/13/2022   HGB 14.9 01/13/2022   HCT 42.1 01/13/2022   PLT 391.0 01/13/2022   GLUCOSE 331 (H) 01/13/2022   CHOL 211 (H) 08/27/2021   TRIG 277.0 (H) 08/27/2021   HDL 72.30 08/27/2021   LDLDIRECT 105.0 08/27/2021   LDLCALC 73 10/09/2011   ALT 11 01/13/2022   AST 11 01/13/2022   NA 136 01/13/2022   K 4.2 01/13/2022   CL 102 01/13/2022   CREATININE 0.57 01/13/2022   BUN 10 01/13/2022   CO2 27 01/13/2022   TSH 1.58 08/27/2021   HGBA1C 7.1 (A) 07/01/2022   MICROALBUR <0.7 06/20/2020   BP Readings from Last 3 Encounters:  07/01/22 116/68  01/29/22 108/62  01/22/22 (!) 142/87    ASSESSMENT AND PLAN:  Discussed the following assessment and plan:  Type 2 diabetes mellitus with hyperglycemia, with long-term current use of insulin (HCC) - improved on tresiba 14 meale time ins 6-8 untios metformin and farxiga - Plan: POC HgB A1c, Basic metabolic panel, CBC with Differential/Platelet, Hepatic function panel, Lipid panel, TSH, VITAMIN D 25 Hydroxy (Vit-D Deficiency, Fractures), Vitamin B12, Microalbumin / creatinine urine ratio  Medication management - Plan: Basic metabolic panel, CBC with Differential/Platelet, Hepatic function panel, Lipid panel, TSH, VITAMIN D 25 Hydroxy (Vit-D Deficiency, Fractures), Vitamin B12  Essential hypertension - Plan: Basic metabolic panel, CBC with Differential/Platelet, Hepatic function panel, Lipid panel, TSH, VITAMIN D 25 Hydroxy (Vit-D Deficiency, Fractures), Vitamin B12  Unintentional weight loss - Plan: Basic  metabolic panel, CBC with Differential/Platelet, Hepatic function panel, Lipid panel, TSH, VITAMIN D 25 Hydroxy (Vit-D Deficiency, Fractures), Vitamin B12  Age-related osteoporosis by dexa  without current pathological fracture - vit d level today consider adding actonel althgouth she prefers no med at all t -3.5+ fn. - Plan: Basic metabolic panel, CBC with Differential/Platelet, Hepatic function panel, Lipid panel, TSH, VITAMIN D 25 Hydroxy (Vit-D Deficiency, Fractures), Vitamin B12  Pain in both feet - consdier early  version of neuropathy wolesion - Plan: Basic metabolic panel, CBC with Differential/Platelet, Hepatic function panel, Lipid panel, TSH, VITAMIN D 25 Hydroxy (Vit-D Deficiency, Fractures), Vitamin B12  Impaired vision  Encounter for Prevnar pneumococcal vaccination - Plan: Pneumococcal conjugate vaccine 20-valent (Prevnar 20) Overdue for lab monitoring  ? Prevnar 20  Vision most of problem   agree proeed with rx cataracts  at this time  needs driver otherwise . Feet pain could be  early neuropathy doesn't seem vascular .  Dexa done last year -3.5 and lower hesitant to take med but  now stable gi tract and dm will chek lab and poss order actonel  7.1 is best a 1c she has had in a while .  Pharmacy to continue to follow  -Patient advised to return or notify health care team  if  new concerns arise.  Patient Instructions  Good to see you today . Checking lab  A1c is now controlled Agree see eye doctor .  Prevnar 20 updated pneumonia vaccine today. We may want  you to begin medication for osteoporosis actonel weekly or monthly will wait on lab results  Get [place] information  for  jury duty  medical reason for excuse.    Neta Mends. Shianne Zeiser M.D.

## 2022-07-01 NOTE — Patient Instructions (Addendum)
Good to see you today . Checking lab  A1c is now controlled Agree see eye doctor .  Prevnar 20 updated pneumonia vaccine today. We may want  you to begin medication for osteoporosis actonel weekly or monthly will wait on lab results  Get [place] information  for jury duty  medical reason for excuse.

## 2022-07-02 ENCOUNTER — Telehealth: Payer: Self-pay

## 2022-07-02 NOTE — Progress Notes (Signed)
Care Management & Coordination Services Pharmacy Team  Reason for Encounter: Medication coordination and delivery  Contacted patient to discuss medications and coordinate delivery from [facility]. [name] [name] to complete  [facility] dispensing form sent to [name] [name] for review.   Last adherence delivery date:06/11/2022      Patient is due for next adherence delivery on: 07/11/2022  This delivery to include: Vials  30 Days  Atorvastatin 40 mg - 1 tablet with dinner Metformin 1000 mg - 1 tablet with lunch and dinner  Carefine pen needles 32g x 4mm  Vitamin D3 1250 mcg - 1 capsule once weekly   Patient declined the following medications this month:    Medication filled by patient assistance: Fiasp Flextouch - filled through patient assistance  Tresiba flex 100 units - filled through patient assistance  Delivery scheduled for 07/11/2022. Unable to speak with patient to confirm date.   Any concerns about your medications?   How often do you forget or accidentally miss a dose?   Do you use a pillbox?   Chart review: Recent office visits:  07/01/2022 [name] Andreas MD - Patient was seen for Type 2 diabetes mellitus with hyperglycemia, with long-term current use of insulin and additional concerns. No medication changes.   Recent consult visits:  None  Hospital visits:  None  Medications: Outpatient Encounter Medications as of 07/02/2022  Medication Sig   Accu-Chek Softclix Lancets lancets 1 each by Other route in the morning, at noon, and at bedtime. Dx E11.9   atorvastatin (LIPITOR) 40 MG tablet TAKE ONE TABLET BY MOUTH ONCE DAILY   BD PEN NEEDLE NANO 2ND GEN 32G X 4 MM MISC USE TO INJECT INSULIN DAILY   Blood Glucose Monitoring Suppl (ACCU-CHEK GUIDE) w/Device KIT Daily use   Cholecalciferol (VITAMIN D3) 1.25 MG (50000 UT) CAPS TAKE ONE CAPSULE BY MOUTH ONCE A WEEK   Continuous Glucose Sensor (DEXCOM G7 SENSOR) MISC 1 Device by Does not apply route every 14  (fourteen) days.   dapagliflozin propanediol (FARXIGA) 5 MG TABS tablet Take 1 tablet (5 mg total) by mouth daily before breakfast.   FIASP FLEXTOUCH 100 UNIT/ML FlexTouch Pen Inject 6 Units into the skin. Once or twice a day   glucose blood (ACCU-CHEK GUIDE) test strip 1 each by Other route in the morning, at noon, and at bedtime. Dx E11.9   glucose blood test strip Use to check blood sugar 1-2 times daily   insulin degludec (TRESIBA FLEXTOUCH) 100 UNIT/ML FlexTouch Pen Inject 22 Units into the skin daily.   metFORMIN (GLUCOPHAGE) 1000 MG tablet TAKE ONE TABLET BY MOUTH AT NOON and TAKE ONE TABLET BY MOUTH EVERY EVENING   No facility-administered encounter medications on file as of 07/02/2022.   BP Readings from Last 3 Encounters:  07/01/22 116/68  01/29/22 108/62  01/22/22 (!) 142/87    Pulse Readings from Last 3 Encounters:  07/01/22 (!) 117  01/22/22 99  01/13/22 (!) 120    Lab Results  Component Value Date/Time   HGBA1C 7.1 (A) 07/01/2022 01:58 PM   HGBA1C 8.6 (A) 12/10/2021 02:13 PM   HGBA1C 11.0 (H) 11/05/2020 11:16 AM   HGBA1C 12.3 (H) 06/20/2020 09:39 AM   Lab Results  Component Value Date   CREATININE 0.69 07/01/2022   BUN 14 07/01/2022   GFR 88.12 07/01/2022   GFRNONAA >60 08/27/2021   GFRAA 96 02/23/2007   NA 137 07/01/2022   K 4.5 07/01/2022   CALCIUM 9.6 07/01/2022   CO2 28 07/01/2022  Inetta Fermo [facility]  Clinical Pharmacist Assistant [phone]

## 2022-07-02 NOTE — Progress Notes (Signed)
Patient informed of results and very happy. Is willing to try once monthly actonel for her bone health. [facility] [address]

## 2022-07-02 NOTE — Progress Notes (Signed)
Results are better  kidney liver thyroid good and no anemia  Cholesterol panel much better  VIt d 70 much better  B12  low normal  advise add b12 or vitamin b12 comolex to daily vitamins  Will forward info to [facility]  Consider adding actonel  medication for bone health

## 2022-07-03 NOTE — Progress Notes (Signed)
Wonderful news about the A1C! I have a f/u scheduled with her in 1 month.  Patient is fine with starting Actonel once monthly for her bones if you want to go ahead and send that in. [id number] [place], [place] - [address] Phone: [phone]  Fax: [phone]     Sherrill Raring Clinical Pharmacist [phone]

## 2022-07-09 ENCOUNTER — Telehealth: Payer: Self-pay

## 2022-07-09 MED ORDER — RISEDRONATE SODIUM 35 MG PO TABS: 35.0000 mg | ORAL_TABLET | ORAL | 5 refills | Status: DC

## 2022-07-09 NOTE — Telephone Encounter (Signed)
Rx Risedornate 35 mg one po per wk dis 4 refill x5 sent per Dr. Fabian Sharp.

## 2022-07-09 NOTE — Addendum Note (Signed)
Addended byVickii Chafe on: 07/09/2022 09:17 AM   Modules accepted: Orders

## 2022-07-11 MED ORDER — RISEDRONATE SODIUM 150 MG PO TABS: 150.0000 mg | ORAL_TABLET | ORAL | 3 refills | Status: DC

## 2022-07-11 NOTE — Addendum Note (Signed)
Addended by: Vickii Chafe on: 07/11/2022 10:21 AM   Modules accepted: Orders

## 2022-07-16 ENCOUNTER — Telehealth: Payer: Self-pay | Admitting: Internal Medicine

## 2022-07-16 NOTE — Telephone Encounter (Signed)
Letter printed  on desk

## 2022-07-16 NOTE — Telephone Encounter (Signed)
Calling with info regarding a jury summons she is trying to avoid, says her juror number is [phone]. The date is 08/07/2022. Says to tell them she needs diabetic meds and insulin and has bad cataracts, bad vision

## 2022-07-21 DIAGNOSIS — E1165 Type 2 diabetes mellitus with hyperglycemia: Secondary | ICD-10-CM | POA: Diagnosis not present

## 2022-07-24 ENCOUNTER — Telehealth: Payer: Self-pay

## 2022-07-24 NOTE — Telephone Encounter (Signed)
Spoke to pt. Inform pt Jury Duty letter is ready to be pick up at the front desk. Verbalized understanding.

## 2022-08-07 ENCOUNTER — Other Ambulatory Visit: Payer: Self-pay | Admitting: Internal Medicine

## 2022-08-15 ENCOUNTER — Telehealth: Payer: Self-pay

## 2022-08-15 NOTE — Telephone Encounter (Signed)
Called pt to advised that [facility] is ready for pick but no answer. Unable to leave vm due to vm being full. Pt numbers are not working so called "friend' on file.

## 2022-08-15 NOTE — Telephone Encounter (Signed)
Spoke to pt and advised pen needles were in the office for pick up. Pt also wants to know what she should do about [place]. Pt stated that the pharmacist got her approved and the shipment was suppose to come a month ago but didn't. I advised that the pharmacist are no longer here but will send to Dr. Fabian Sharp for advise.

## 2022-08-18 NOTE — Telephone Encounter (Signed)
Please send this to the covering pharmacist  for help  I think Cherly (?spp)Gentry? (

## 2022-08-19 ENCOUNTER — Telehealth: Payer: Self-pay

## 2022-08-19 NOTE — Telephone Encounter (Signed)
Noted  

## 2022-08-19 NOTE — Progress Notes (Unsigned)
   08/19/2022  Patient ID: [name] [name], female   DOB: May 26, 1950, 72 y.o.   MRN: [id number]  Contacted [employer] in response to clinic routed request stating [name] was approved for Farxiga PAP but has not received the medication.  Patient was approved for PAP 4/29, but the provider portion of the application is missing.  Office needs to send over a new prescription to [facility], which will suffice as the patient portion.  Prescription is pending for Farxiga 5mg  daily for Dr. Fabian Sharp to sign.  Patient has been given my direct number to contact me if the medication is not received by 7/20.  Lenna Gilford, PharmD, DPLA

## 2022-08-20 MED ORDER — DAPAGLIFLOZIN PROPANEDIOL 5 MG PO TABS: 5.0000 mg | ORAL_TABLET | Freq: Every day | ORAL | 0 refills | Status: AC

## 2022-08-20 NOTE — Telephone Encounter (Signed)
Ok to give samples  requested if we have them

## 2022-08-20 NOTE — Telephone Encounter (Signed)
I tried contacting patient to let her know that there are samples of Farxiga 10 mg up front for her. She will have to cut the tablets in 1/2 for 5 mg. Directions are on the box of samples.

## 2022-09-03 ENCOUNTER — Telehealth: Payer: Self-pay

## 2022-09-03 NOTE — Telephone Encounter (Signed)
Attempted to reach pt and inform her we receive the NovoFine 32G supplies from [facility].    Voicemail has not been set up. Will try again.

## 2022-09-09 NOTE — Telephone Encounter (Signed)
Was able to reach to pt . Pt states she was out for vacation.   Updates pt on the message below. Verbalized understanding.   Pt ask about Marcelline Deist if we have any supply arrive. She states she has been waiting for 2 months now. Inform pt we have not received it yet. Advise pt to call him to get an update on the status. Verbalized understanding.

## 2022-10-03 ENCOUNTER — Ambulatory Visit (INDEPENDENT_AMBULATORY_CARE_PROVIDER_SITE_OTHER): Payer: Medicare HMO

## 2022-10-03 VITALS — Ht 63.0 in | Wt 128.0 lb

## 2022-10-03 DIAGNOSIS — Z0000 Encounter for general adult medical examination without abnormal findings: Secondary | ICD-10-CM

## 2022-10-03 NOTE — Patient Instructions (Addendum)
[name] [name] , Thank you for taking time to come for your Medicare Wellness Visit. I appreciate your ongoing commitment to your health goals. Please review the following plan we discussed and let me know if I can assist you in the future.   Referrals/Orders/Follow-Ups/Clinician Recommendations:   This is a list of the screening recommended for you and due dates:  Health Maintenance  Topic Date Due   Hepatitis C Screening  Never done   Zoster (Shingles) Vaccine (1 of 2) Never done   DTaP/Tdap/Td vaccine (2 - Tdap) 02/11/2015   COVID-19 Vaccine (3 - Pfizer risk series) 07/13/2019   Mammogram  11/10/2019   Complete foot exam   06/18/2021   Flu Shot  09/11/2022   Eye exam for diabetics  12/14/2022   Hemoglobin A1C  01/01/2023   Yearly kidney function blood test for diabetes  07/01/2023   Yearly kidney health urinalysis for diabetes  07/01/2023   Medicare Annual Wellness Visit  10/03/2023   Colon Cancer Screening  01/22/2025   Pneumonia Vaccine  Completed   DEXA scan (bone density measurement)  Completed   HPV Vaccine  Aged Out    Advanced directives: (Declined) Advance directive discussed with you today. Even though you declined this today, please call our office should you change your mind, and we can give you the proper paperwork for you to fill out.  Next Medicare Annual Wellness Visit scheduled for next year: Yes

## 2022-10-03 NOTE — Progress Notes (Signed)
Subjective:   [name] [name] is a 72 y.o. female who presents for Medicare Annual (Subsequent) preventive examination.  Visit Complete: Virtual  I connected with  [name] on 10/03/22 by a audio enabled telemedicine application and verified that I am speaking with the correct person using two identifiers.  Patient Location: Home  Provider Location: Home Office  I discussed the limitations of evaluation and management by telemedicine. The patient expressed understanding and agreed to proceed.   Review of Systems    Vital Signs: Unable to obtain new vitals due to this being a telehealth visit.  Cardiac Risk Factors include: advanced age (>37men, >51 women);diabetes mellitus;hypertension     Objective:    Today's Vitals   10/03/22 1553  Weight: 128 lb (58.1 kg)  Height: 5\' 3"  (1.6 m)   Body mass index is 22.67 kg/m.     10/03/2022    4:02 PM 10/01/2021   12:59 PM 08/27/2021    4:55 PM  Advanced Directives  Does Patient Have a Medical Advance Directive? No No No  Would patient like information on creating a medical advance directive? No - Patient declined No - Patient declined No - Patient declined    Current Medications (verified) Outpatient Encounter Medications as of 10/03/2022  Medication Sig   Accu-Chek Softclix Lancets lancets 1 each by Other route in the morning, at noon, and at bedtime. Dx E11.9   atorvastatin (LIPITOR) 40 MG tablet TAKE ONE TABLET BY MOUTH EVERY EVENING   BD PEN NEEDLE NANO 2ND GEN 32G X 4 MM MISC USE TO INJECT INSULIN DAILY   Blood Glucose Monitoring Suppl (ACCU-CHEK GUIDE) w/Device KIT Daily use   Cholecalciferol (VITAMIN D3) 1.25 MG (50000 UT) CAPS TAKE ONE CAPSULE BY MOUTH ONCE A WEEK   Continuous Glucose Sensor (DEXCOM G7 SENSOR) MISC 1 Device by Does not apply route every 14 (fourteen) days.   dapagliflozin propanediol (FARXIGA) 5 MG TABS tablet Take 1 tablet (5 mg total) by mouth daily before breakfast.   FIASP FLEXTOUCH 100  UNIT/ML FlexTouch Pen Inject 6 Units into the skin. Once or twice a day   glucose blood (ACCU-CHEK GUIDE) test strip 1 each by Other route in the morning, at noon, and at bedtime. Dx E11.9   glucose blood test strip Use to check blood sugar 1-2 times daily   insulin degludec (TRESIBA FLEXTOUCH) 100 UNIT/ML FlexTouch Pen Inject 22 Units into the skin daily.   metFORMIN (GLUCOPHAGE) 1000 MG tablet TAKE ONE TABLET BY MOUTH AT NOON and TAKE ONE TABLET BY MOUTH EVERY EVENING   risedronate (ACTONEL) 150 MG tablet Take 1 tablet (150 mg total) by mouth every 30 (thirty) days. with water on empty stomach, nothing by mouth or lie down for next 30 minutes.   No facility-administered encounter medications on file as of 10/03/2022.    Allergies (verified) Naproxen and Simvastatin   History: Past Medical History:  Diagnosis Date   Allergic rhinitis    Anxiety    Diabetes mellitus    Gallstones    Hyperlipidemia    Hypertension    IBS (irritable bowel syndrome)    Normal nuclear stress test 02/10/2006   low risk study    Obesity    Sciatica    Past Surgical History:  Procedure Laterality Date   CESAREAN SECTION     x 3; 40,98,11   NM MYOVIEW LTD  02/2006   low risk study   Family History  Problem Relation Age of Onset   Acute myelogenous  leukemia Mother        died when pat age 35    Diabetes Father    Lung cancer Maternal Grandmother    Skin cancer Maternal Grandfather    Breast cancer Paternal Grandmother    Diabetes Paternal Grandfather    Social History   Socioeconomic History   Marital status: Divorced    Spouse name: Not on file   Number of children: 3   Years of education: Not on file   Highest education level: Not on file  Occupational History   Occupation: retired  Tobacco Use   Smoking status: Never   Smokeless tobacco: Never  Substance and Sexual Activity   Alcohol use: No   Drug use: No   Sexual activity: Not on file  Other Topics Concern   Not on file   Social History Narrative   Separated single mom   HH of 4   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          Social Determinants of Health   Financial Resource Strain: Low Risk  (10/03/2022)   Overall Financial Resource Strain (CARDIA)    Difficulty of Paying Living Expenses: Not hard at all  Food Insecurity: No Food Insecurity (10/03/2022)   Hunger Vital Sign    Worried About Running Out of Food in the Last Year: Never true    Ran Out of Food in the Last Year: Never true  Transportation Needs: No Transportation Needs (10/03/2022)   PRAPARE - Administrator, Civil Service (Medical): No    Lack of Transportation (Non-Medical): No  Physical Activity: Inactive (10/03/2022)   Exercise Vital Sign    Days of Exercise per Week: 0 days    Minutes of Exercise per Session: 0 min  Stress: No Stress Concern Present (10/03/2022)   [employer] of Occupational Health - Occupational Stress Questionnaire    Feeling of Stress : Not at all  Social Connections: Socially Isolated (10/03/2022)   Social Connection and Isolation Panel [NHANES]    Frequency of Communication with Friends and Family: More than three times a week    Frequency of Social Gatherings with Friends and Family: More than three times a week    Attends Religious Services: Never    Database administrator or Organizations: No    Attends Engineer, structural: Never    Marital Status: Divorced    Tobacco Counseling Counseling given: Not Answered   Clinical Intake:  Pre-visit preparation completed: Yes  Pain : No/denies pain     BMI - recorded: 22.67 Nutritional Status: BMI of 19-24  Normal Nutritional Risks: None Diabetes: Yes CBG done?: Yes (CBG) CBG resulted in Enter/ Edit results?: Yes Did pt. bring in CBG monitor from  home?: No  How often do you need to have someone help you when you read instructions, pamphlets, or other written materials from your doctor or pharmacy?: 3 - Sometimes (Family Assist)  Interpreter Needed?: No  Information entered by :: Theresa Mulligan LPN   Activities of Daily Living    10/03/2022    4:00 PM  In your present state of health, do you have any difficulty performing the following activities:  Hearing? 0  Vision? 0  Difficulty concentrating or making decisions? 0  Walking or climbing stairs? 0  Dressing or bathing? 0  Doing errands, shopping? 0  Preparing Food and eating ? N  Using the Toilet? N  In the past six months, have you accidently leaked urine? N  Do you have problems with loss of bowel control? N  Managing your Medications? N  Managing your Finances? N  Housekeeping or managing your Housekeeping? N    Patient Care Team: Panosh, Neta Mends, MD as PCP - General Haygood, Maris Berger, MD (Inactive) as Attending Physician (Obstetrics and Gynecology) Sherrill Raring, [facility] (Inactive) (Pharmacist)  Indicate any recent Medical Services you may have received from other than [facility] providers in the past year (date may be approximate).     Assessment:   This is a routine wellness examination for Talan.  Hearing/Vision screen Hearing Screening - Comments:: Denies hearing difficulties   Vision Screening - Comments:: Wears rx glasses - up to date with routine eye exams with  [facility]  Dietary issues and exercise activities discussed:     Goals Addressed               This Visit's Progress     Cataract Surgery (pt-stated)        I want to have cataract surgery       Depression Screen    10/03/2022    3:59 PM 10/01/2021   12:54 PM 08/27/2021   11:34 AM 11/05/2020   11:15 AM 12/08/2017    9:38 AM  PHQ 2/9 Scores  PHQ - 2 Score 0 0 0 0 0  PHQ- 9 Score  0 4 5     Fall Risk    10/03/2022    4:01 PM 07/01/2022    1:59 PM 10/01/2021   12:58 PM  08/27/2021   11:34 AM 11/05/2020   10:36 AM  Fall Risk   Falls in the past year? 0 0 0 0 0  Number falls in past yr: 0 0 0 0 0  Injury with Fall? 0 0 0 0 0  Risk for fall due to : No Fall Risks No Fall Risks No Fall Risks No Fall Risks   Follow up Falls prevention discussed Falls evaluation completed  Falls prevention discussed     MEDICARE RISK AT HOME: Medicare Risk at Home Any stairs in or around the home?: Yes If so, are there any without handrails?: No Home free of loose throw rugs in walkways, pet beds, electrical cords, etc?: Yes Adequate lighting in your home to reduce risk of falls?: Yes Life alert?: No Use of a cane, walker or w/c?: No Grab bars in the bathroom?: No Shower chair or bench in shower?: No Elevated toilet seat or a handicapped toilet?: No  TIMED UP AND GO:  Was the test performed?  No    Cognitive Function:        10/03/2022    4:03 PM 10/01/2021   12:59 PM  6CIT Screen  What Year? 0 points 0 points  What month? 0 points 0 points  What time? 0 points 0 points  Count back from 20 0 points 0 points  Months in reverse 0 points 0 points  Repeat phrase 0 points 0 points  Total Score 0 points 0 points    Immunizations Immunization History  Administered Date(s) Administered   Fluad Quad(high Dose 65+) 11/30/2018, 11/05/2020   H1N1 04/28/2008   Influenza Split 01/10/2011, 01/15/2012   Influenza Whole 01/12/2007, 05/11/2008, 01/21/2010  Influenza, High Dose Seasonal PF 12/08/2017   Influenza,inj,Quad PF,6+ Mos 10/19/2012, 10/06/2013, 03/22/2015, 01/18/2016, 12/07/2016   Influenza-Unspecified 10/19/2012, 10/06/2013, 03/22/2015, 01/18/2016, 12/07/2016   PFIZER(Purple Top)SARS-COV-2 Vaccination 05/24/2019, 06/15/2019   PNEUMOCOCCAL CONJUGATE-20 07/01/2022   Pneumococcal Polysaccharide-23 01/21/2010   Td 02/10/2005    TDAP status: Due, Education has been provided regarding the importance of this vaccine. Advised may receive this vaccine at local  pharmacy or Health Dept. Aware to provide a copy of the vaccination record if obtained from local pharmacy or Health Dept. Verbalized acceptance and understanding.  Flu Vaccine status: Due, Education has been provided regarding the importance of this vaccine. Advised may receive this vaccine at local pharmacy or Health Dept. Aware to provide a copy of the vaccination record if obtained from local pharmacy or Health Dept. Verbalized acceptance and understanding.  Pneumococcal vaccine status: Up to date  Covid-19 vaccine status: Declined, Education has been provided regarding the importance of this vaccine but patient still declined. Advised may receive this vaccine at local pharmacy or Health Dept.or vaccine clinic. Aware to provide a copy of the vaccination record if obtained from local pharmacy or Health Dept. Verbalized acceptance and understanding.  Qualifies for Shingles Vaccine? Yes   Zostavax completed No   Shingrix Completed?: No.    Education has been provided regarding the importance of this vaccine. Patient has been advised to call insurance company to determine out of pocket expense if they have not yet received this vaccine. Advised may also receive vaccine at local pharmacy or Health Dept. Verbalized acceptance and understanding.  Screening Tests Health Maintenance  Topic Date Due   Hepatitis C Screening  Never done   Zoster Vaccines- Shingrix (1 of 2) Never done   DTaP/Tdap/Td (2 - Tdap) 02/11/2015   COVID-19 Vaccine (3 - Pfizer risk series) 07/13/2019   MAMMOGRAM  11/10/2019   FOOT EXAM  06/18/2021   INFLUENZA VACCINE  09/11/2022   OPHTHALMOLOGY EXAM  12/14/2022   HEMOGLOBIN A1C  01/01/2023   Diabetic kidney evaluation - eGFR measurement  07/01/2023   Diabetic kidney evaluation - Urine ACR  07/01/2023   Medicare Annual Wellness (AWV)  10/03/2023   Colonoscopy  01/22/2025   Pneumonia Vaccine 45+ Years old  Completed   DEXA SCAN  Completed   HPV VACCINES  Aged Out     Health Maintenance  Health Maintenance Due  Topic Date Due   Hepatitis C Screening  Never done   Zoster Vaccines- Shingrix (1 of 2) Never done   DTaP/Tdap/Td (2 - Tdap) 02/11/2015   COVID-19 Vaccine (3 - Pfizer risk series) 07/13/2019   MAMMOGRAM  11/10/2019   FOOT EXAM  06/18/2021   INFLUENZA VACCINE  09/11/2022    Colorectal cancer screening: Type of screening: Colonoscopy. Completed 01/22/22. Repeat every 3 years  Mammogram status: Ordered Patient deferred. Pt provided with contact info and advised to call to schedule appt.   Bone Density status: Completed 11/15/21. Results reflect: Bone density results: OSTEOPOROSIS. Repeat every   years.  Lung Cancer Screening: (Low Dose CT Chest recommended if Age 9-80 years, 20 pack-year currently smoking OR have quit w/in 15years.) does not qualify.     Additional Screening:  Hepatitis C Screening: does qualify; Completed Deferred  Vision Screening: Recommended annual ophthalmology exams for early detection of glaucoma and other disorders of the eye. Is the patient up to date with their annual eye exam?  Yes  Who is the provider or what is the name of the office in which the patient  attends annual eye exams? Dr Louanna Raw If pt is not established with a provider, would they like to be referred to a provider to establish care? No .   Dental Screening: Recommended annual dental exams for proper oral hygiene  Diabetic Foot Exam: Diabetic Foot Exam: Overdue, Pt has been advised about the importance in completing this exam. Pt is scheduled for diabetic foot exam on Followed by PCP.  Community Resource Referral / Chronic Care Management:  CRR required this visit?  No   CCM required this visit?  No     Plan:     I have personally reviewed and noted the following in the patient's chart:   Medical and social history Use of alcohol, tobacco or illicit drugs  Current medications and supplements including opioid prescriptions. Patient  is not currently taking opioid prescriptions. Functional ability and status Nutritional status Physical activity Advanced directives List of other physicians Hospitalizations, surgeries, and ER visits in previous 12 months Vitals Screenings to include cognitive, depression, and falls Referrals and appointments  In addition, I have reviewed and discussed with patient certain preventive protocols, quality metrics, and best practice recommendations. A written personalized care plan for preventive services as well as general preventive health recommendations were provided to patient.     Tillie Rung, LPN   04/17/6576   After Visit Summary: (MyChart) Due to this being a telephonic visit, the after visit summary with patients personalized plan was offered to patient via MyChart   Nurse Notes: None

## 2022-12-15 ENCOUNTER — Other Ambulatory Visit: Payer: Medicare HMO

## 2022-12-15 DIAGNOSIS — E1165 Type 2 diabetes mellitus with hyperglycemia: Secondary | ICD-10-CM

## 2022-12-15 NOTE — Progress Notes (Signed)
12/15/2022 Name: [name] [name] MRN: [id number] DOB: 06-05-1950  Chief Complaint  Patient presents with   Diabetes   Medication Management    [name] [name] is a 72 y.o. year old female who presented for a telephone visit.   They were referred to the pharmacist by their PCP for assistance in managing diabetes and complex medication management.    Subjective:  Care Team: Primary Care Provider: Madelin Headings, MD   Medication Access/Adherence  Current Pharmacy:  [facility] [address] [place], [place] - [address] [place] [address] Phone: [phone] Fax: [phone]  [facility] - [place], [place] - [address] [place] [address] Phone: [phone] Fax: [phone]  [facility] - [place], [place] - [address] [place] [address] Phone: [phone] Fax: [phone]   Patient reports affordability concerns with their medications: No  Patient reports access/transportation concerns to their pharmacy: No  Patient reports adherence concerns with their medications:  No     Diabetes:  Current medications: Fiasp 8 units 1-2x/day with a meal, Tresiba 14 units daily, Farxiga 5mg , Metformin 1000mg  BID Medications tried in the past: Trulicity, Glimepiride, Actos, Onglyza Current glucose readings: Denies any lows or highs >200 2 hours post-meal Using Dexcom G7 Sensoros   Patient denies hypoglycemic s/sx including dizziness, shakiness, sweating. Patient denies hyperglycemic symptoms including polyuria, polydipsia, polyphagia, nocturia, neuropathy, blurred vision.  Current meal patterns:  -Typically two meals a day, mindful of carbs   Current medication access support: Tresiba/Fiasp through [employer]; Farxiga through AZ&Me   Objective:  Lab Results  Component Value Date   HGBA1C 7.1 (A) 07/01/2022    Lab Results  Component Value Date   CREATININE  0.69 07/01/2022   BUN 14 07/01/2022   NA 137 07/01/2022   K 4.5 07/01/2022   CL 101 07/01/2022   CO2 28 07/01/2022    Lab Results  Component Value Date   CHOL 182 07/01/2022   HDL 75.10 07/01/2022   LDLCALC 84 07/01/2022   LDLDIRECT 105.0 08/27/2021   TRIG 111.0 07/01/2022   CHOLHDL 2 07/01/2022    Medications Reviewed Today     Reviewed by [name] [name], RPH (Pharmacist) on 12/15/22 at 1449  Med List Status: <None>   Medication Order Taking? Sig Documenting Provider Last Dose Status Informant  Accu-Chek Softclix Lancets lancets [id number] No 1 each by Other route in the morning, at noon, and at bedtime. Dx E11.9 [name] Headings, MD Taking Active   atorvastatin (LIPITOR) 40 MG tablet [id number]  TAKE ONE TABLET BY MOUTH EVERY EVENING [name], [name] Mends, MD  Active   BD PEN NEEDLE NANO 2ND GEN 32G X 4 MM MISC [id number] No USE TO INJECT INSULIN DAILY [name] Foster, FNP Taking Active   Blood Glucose Monitoring Suppl (ACCU-CHEK GUIDE) w/Device KIT [id number] No Daily use [name], [name] Mends, MD Taking Active   Cholecalciferol (VITAMIN D3) 1.25 MG (50000 UT) CAPS [id number] No TAKE ONE CAPSULE BY MOUTH ONCE A WEEK [name] Rancher B, FNP Taking Active   Continuous Glucose Sensor (DEXCOM G7 SENSOR) MISC [id number]  1 Device by Does not apply route every 14 (fourteen) days. [name], [name] Mends, MD  Active   dapagliflozin propanediol (FARXIGA) 5 MG TABS tablet [id number]  Take 1 tablet (5 mg total) by mouth daily before breakfast. [name], [name] Mends, MD  Active   FIASP FLEXTOUCH 100 UNIT/ML FlexTouch Pen [id number]  No Inject 6 Units into the skin. Once or twice a day [provider] Taking Active   glucose blood (ACCU-CHEK GUIDE) test strip [id number] No 1 each by Other route in the morning, at noon, and at bedtime. Dx E11.9 [name] Headings, MD Taking Active   glucose blood test strip [id number] No Use to check blood sugar 1-2 times daily [name], [name] Mends, MD Taking Active   insulin degludec (TRESIBA  FLEXTOUCH) 100 UNIT/ML FlexTouch Pen [id number] No Inject 22 Units into the skin daily. [name] Headings, MD Taking Active   metFORMIN (GLUCOPHAGE) 1000 MG tablet [id number] No TAKE ONE TABLET BY MOUTH AT NOON and TAKE ONE TABLET BY MOUTH EVERY [name] Kinsman B, FNP Taking Active   risedronate (ACTONEL) 150 MG tablet [id number]  Take 1 tablet (150 mg total) by mouth every 30 (thirty) days. with water on empty stomach, nothing by mouth or lie down for next 30 minutes. [name], [name] Mends, MD  Active               Assessment/Plan:   Diabetes: - Currently controlled - Reviewed long term cardiovascular and renal outcomes of uncontrolled blood sugar - Reviewed goal A1c, goal fasting, and goal 2 hour post prandial glucose - Recommend to continue current medication therapy  - Recommend to check glucose throughout day with sensors - Meets financial criteria for Tresiba/Fiasp patient assistance program through [place]. Will collaborate with provider, CPhT, and patient to pursue assistance renewal for 2025.[name] [name] conditionally approved through 02/10/24, reminded patient to fill out online consent    Follow Up Plan: None indicated at this time, instructed patient to call with any concerns  [name] [name], PharmD Clinical Pharmacist [phone]

## 2022-12-23 ENCOUNTER — Telehealth: Payer: Self-pay

## 2022-12-23 NOTE — Telephone Encounter (Signed)
Attempted to reach pt. In regards to PAP for [place].   They are ready for pt to pick it up.   Voicemail has not been set up. Will try again.

## 2022-12-24 NOTE — Telephone Encounter (Signed)
Attempted to reach pt. Has not set up voicemail.

## 2022-12-24 NOTE — Telephone Encounter (Signed)
Attempted to reach pt.  Voicemail is full.

## 2022-12-31 NOTE — Telephone Encounter (Signed)
Spoke with patient and relayed message that her medication was received from the patient assistance program. Pt states she will come in to pick it up

## 2022-12-31 NOTE — Telephone Encounter (Signed)
Attempted  to pt. Voicemail has not set up.   Attempted to reach pt's son. Call got hung up.

## 2023-01-23 ENCOUNTER — Telehealth: Payer: Self-pay

## 2023-01-23 NOTE — Progress Notes (Unsigned)
   01/23/2023  Patient ID: [name] [name], female   DOB: Nov 29, 1950, 72 y.o.   MRN: [id number]  Attempted to contact patient to let her know that [employer]Me is still requiring her verbal consent to keep her renewed in [place] assistance for 2025. She would need to call [phone]. Was unable to reach and no voicemail set up.  Sherrill Raring, PharmD Clinical Pharmacist [phone]

## 2023-01-27 NOTE — Telephone Encounter (Signed)
Spoke with patient, she will contact the company today. Instructed patient to reach out with any further questions/concerns.  [name] [name], PharmD Clinical Pharmacist [phone]

## 2023-02-10 ENCOUNTER — Telehealth: Payer: Self-pay

## 2023-02-10 NOTE — Telephone Encounter (Signed)
Call [employer] regarding update on her PAP for Tresiaba/Novolog ,Sander Radon has not make a decision on the application but will notify pt in a few days.will follow up.

## 2023-02-20 ENCOUNTER — Telehealth: Payer: Self-pay

## 2023-02-20 NOTE — Telephone Encounter (Signed)
 Attempted to reach pt about NovoFine 32G tip supply from PAP.   Voicemail has not been set up. Will try again

## 2023-02-24 NOTE — Telephone Encounter (Signed)
 Spoke to pt. Inform pt that the supply is ready for pick up.   Pt states she will try to come pick it up tomorrow.   No further action is needed.

## 2023-03-04 NOTE — Telephone Encounter (Signed)
Call pt regarding her AZ&ME(Farxiga)pap,pt did not answer and does not have a voice mail to leave a msg, will follow up tomorrow. per AZ&ME is not showing an application for this year 2025, they only show last year application 05/2022,we have to submit one for this year.

## 2023-03-04 NOTE — Telephone Encounter (Signed)
Following up on pt PAP Novo Nordisk [employer] pt has been Approved for 2025 and have started a refill and will let pt know when fill it's at Dr office.

## 2023-03-06 ENCOUNTER — Telehealth: Payer: Self-pay

## 2023-03-06 NOTE — Progress Notes (Signed)
   03/06/2023  Patient ID: [name] [name], female   DOB: [birth year], 73 y.o.   MRN: [id number]  Attempted to contact patient to let her know that AZ&Me is still requiring her verbal consent to keep her renewed in [place] assistance for 2025. She would need to call [phone]. Was unable to reach and no voicemail set up.  Sherrill Raring, PharmD Clinical Pharmacist [phone]

## 2023-03-24 ENCOUNTER — Telehealth: Payer: Self-pay

## 2023-03-24 NOTE — Progress Notes (Signed)
   03/24/2023  Patient ID: [name] [name], female   DOB: 03/05/1950, 73 y.o.   MRN: [id number]  Attempted to contact patient to follow up on her farxiga application (she still needs to complete her portion). No answer, unable to leave voicemail.

## 2023-03-24 NOTE — Telephone Encounter (Signed)
Gave pt a call following up on pt pap AZ&ME pt did not answer pt is not set up to leave voice msg.(Per RPH pt needs to get in contact with AZ&ME before she can get any father pt's assistance with  AZ&ME.

## 2023-03-27 NOTE — Telephone Encounter (Signed)
Try reaching out to pt both number listed for pt are disconnected.was following up on pap AZ&ME.

## 2023-04-02 ENCOUNTER — Telehealth: Payer: Self-pay

## 2023-04-02 NOTE — Telephone Encounter (Signed)
 Copied from CRM [id number]. Topic: General - Other >> Apr 02, 2023 10:03 AM Kathryne Eriksson wrote: Reason for CRM: Health [employer] >> Apr 02, 2023 10:07 AM Kathryne Eriksson wrote: [facility] Team [employer] [phone]  States they have faxed a form to the office 4 times so far and haven't had a response as of yet. States patient signed up for medicare advantage special needs insurance and they are needing to confirm patient has either diabetes or congestive heart failure.

## 2023-04-02 NOTE — Telephone Encounter (Signed)
 Attempted to reach pt. Voicemail has not been set up yet.

## 2023-04-02 NOTE — Telephone Encounter (Signed)
 Have call and left several messages pt has not answer back at this time I will leave it at up pt  when needed on pt's assistance to call us back.

## 2023-04-02 NOTE — Telephone Encounter (Signed)
 Form was faxed back with confirmation.

## 2023-04-14 ENCOUNTER — Telehealth: Payer: Self-pay

## 2023-04-14 NOTE — Telephone Encounter (Signed)
 Attempted to reach pt on patient assistant program, to inform her that her Evaristo Bury and novofine needle are in and ready for a pick up.  Call was unsuccessful, voicemail has not been set up.   Will try again.

## 2023-04-15 ENCOUNTER — Telehealth: Payer: Self-pay

## 2023-04-15 NOTE — Telephone Encounter (Signed)
 Copied from CRM [id number]. Topic: General - Other >> Apr 15, 2023  3:34 PM Gurney Maxin H wrote: Reason for CRM: Patient returned call to clinic, gave patient message from notes as stated that her Evaristo Bury and novofine needle are in and ready for a pick up patient acknowledged.

## 2023-04-16 NOTE — Telephone Encounter (Signed)
 Noted! Thank you

## 2023-04-16 NOTE — Telephone Encounter (Signed)
 See other encounter.

## 2023-04-29 ENCOUNTER — Telehealth: Payer: Self-pay

## 2023-04-29 NOTE — Progress Notes (Signed)
   04/29/2023  Patient ID: [name] [name], female   DOB: 09-01-1950, 73 y.o.   MRN: [id number]  Contacted patient to schedule follow up appt and follow up on status of her farxiga assistance (patient had needed to provide company with verbal consent). Patient admits she never called the company despite several reminders.  Will leave paper copy at front office and patient plans to come by tmrw to sign when she picks up her insulin PAP.  Follow Up: 05/04/23  Sherrill Raring, PharmD Clinical Pharmacist [phone]

## 2023-05-04 ENCOUNTER — Other Ambulatory Visit

## 2023-05-04 DIAGNOSIS — Z79899 Other long term (current) drug therapy: Secondary | ICD-10-CM

## 2023-05-04 DIAGNOSIS — E1165 Type 2 diabetes mellitus with hyperglycemia: Secondary | ICD-10-CM

## 2023-05-04 NOTE — Progress Notes (Signed)
 05/04/2023 Name: [name] [name] MRN: [id number] DOB: Aug 04, 1950  Chief Complaint  Patient presents with   Diabetes   Medication Management    [name] [name] is a 73 y.o. year old female who presented for a telephone visit.   They were referred to the pharmacist by their PCP for assistance in managing diabetes and complex medication management.    Subjective:  Care Team: Primary Care Provider: Madelin Headings, MD ; Next Scheduled Visit: 06/02/23  Medication Access/Adherence  Current Pharmacy:  [id number] - [place], [place] - [address] [place] [address] Phone: [phone] Fax: [phone]  [facility] - [place], [place] - [address] [place] [address] Phone: [phone] Fax: [phone]   Patient reports affordability concerns with their medications: Yes  - sensors Patient reports access/transportation concerns to their pharmacy: Yes  - relies on son for transport, okay with this at this time Patient reports adherence concerns with their medications:  Yes  - skips metformin some if she eats out, had been skipping farxiga if she didn't eat breakfast   Diabetes:  Current medications: Farxiga 5mg , Fiasp 8 units once to twice daily with meal (skips second injection if only one meal that day), Metformin, Tresiba 15 units daily Medications tried in the past: Glimepiride, Victoza, Actos  Current glucose readings: 135 this morning fasting, reports not seeing higher than that unless she checks post-prandial, usually checking fasting  Been without sensors due to insurance change, so pricking finger instead   Observed patterns:  Patient denies hypoglycemic s/sx including dizziness, shakiness, sweating. Patient denies hyperglycemic symptoms including polyuria, polydipsia, polyphagia, nocturia, neuropathy, blurred vision.  Current meal patterns:  -Only eats 1-2 meals per day  Current medication access  support: Tresiba/Fiasp/Pen needles (NOVO), Farxiga 5mg  (renewal pending with AZ&Me)   Objective:  Lab Results  Component Value Date   HGBA1C 7.1 (A) 07/01/2022    Lab Results  Component Value Date   CREATININE 0.69 07/01/2022   BUN 14 07/01/2022   NA 137 07/01/2022   K 4.5 07/01/2022   CL 101 07/01/2022   CO2 28 07/01/2022    Lab Results  Component Value Date   CHOL 182 07/01/2022   HDL 75.10 07/01/2022   LDLCALC 84 07/01/2022   LDLDIRECT 105.0 08/27/2021   TRIG 111.0 07/01/2022   CHOLHDL 2 07/01/2022    Medications Reviewed Today     Reviewed by Sherrill Raring, RPH (Pharmacist) on 05/04/23 at 1559  Med List Status: <None>   Medication Order Taking? Sig Documenting Provider Last Dose Status Informant  Accu-Chek Softclix Lancets lancets [id number]  1 each by Other route in the morning, at noon, and at bedtime. Dx E11.9 Madelin Headings, MD  Active   atorvastatin (LIPITOR) 40 MG tablet [id number] Yes TAKE ONE TABLET BY MOUTH EVERY EVENING Panosh, Neta Mends, MD Taking Active   BD PEN NEEDLE NANO 2ND GEN 32G X 4 MM MISC [id number]  USE TO INJECT INSULIN DAILY Worthy Rancher B, FNP  Active   Blood Glucose Monitoring Suppl (ACCU-CHEK GUIDE) w/Device KIT [id number]  Daily use Panosh, Neta Mends, MD  Active   Cholecalciferol (VITAMIN D3) 1.25 MG (50000 UT) CAPS [id number]  TAKE ONE CAPSULE BY MOUTH ONCE A WEEK Worthy Rancher B, FNP  Active   Continuous Glucose Sensor (DEXCOM G7 SENSOR) MISC [id number]  1 Device by Does not apply route every 14 (fourteen) days. Panosh, Neta Mends, MD  Active  dapagliflozin propanediol (FARXIGA) 5 MG TABS tablet [id number] Yes Take 1 tablet (5 mg total) by mouth daily before breakfast. Panosh, Neta Mends, MD Taking Active   FIASP FLEXTOUCH 100 UNIT/ML FlexTouch Pen [id number] Yes Inject 6 Units into the skin. Once or twice a day [provider] Taking Active   glucose blood (ACCU-CHEK GUIDE) test strip [id number]  1 each by Other route in the morning, at noon,  and at bedtime. Dx E11.9 Madelin Headings, MD  Active   glucose blood test strip [id number]  Use to check blood sugar 1-2 times daily Panosh, Neta Mends, MD  Active   insulin degludec (TRESIBA FLEXTOUCH) 100 UNIT/ML FlexTouch Pen [id number]  Inject 22 Units into the skin daily. Panosh, Neta Mends, MD  Active            Med Note Barnie Mort May 04, 2023  3:59 PM) 15 units daily  metFORMIN (GLUCOPHAGE) 1000 MG tablet [id number] Yes TAKE ONE TABLET BY MOUTH AT NOON and TAKE ONE TABLET BY MOUTH EVERY Herma Carson, Oran Rein B, FNP Taking Active   risedronate (ACTONEL) 150 MG tablet [id number]  Take 1 tablet (150 mg total) by mouth every 30 (thirty) days. with water on empty stomach, nothing by mouth or lie down for next 30 minutes. Panosh, Neta Mends, MD  Active            Med Note Leitha Bleak, Milas Kocher   Mon May 04, 2023  3:59 PM) Never started              Assessment/Plan:   Diabetes: - Currently uncontrolled - Reviewed long term cardiovascular and renal outcomes of uncontrolled blood sugar - Reviewed goal A1c, goal fasting, and goal 2 hour post prandial glucose - Reviewed dietary modifications including low carb diet - Recommend to continue current medication therapy, stressed the importance of adherence (made note of several late to fill dates with patient)  -Farxiga 5mg  AZ&Me renewal app pending as patient allowed renewal to lapse - Patient denies personal or family history of multiple endocrine neoplasia type 2, medullary thyroid cancer; personal history of pancreatitis or gallbladder disease. - Recommend to check glucose daily -Due for f/u with PCP and A1C recheck, recommend updated CMP, lipid panel as well. Patient request vit d recheck also if possible. Made note for appt sch 06/02/23    Follow Up Plan: 2 months (sees PCP in 1 month)  Sherrill Raring, PharmD Clinical Pharmacist [phone]

## 2023-06-01 NOTE — Progress Notes (Unsigned)
 No chief complaint on file.   HPI: [name] [name] 73 y.o. come in for Chronic disease management  Last visit with me 5 24  but has been  followed  by pharamcy and med team   ROS: See pertinent positives and negatives per HPI.  Past Medical History:  Diagnosis Date   Allergic rhinitis    Anxiety    Diabetes mellitus    Gallstones    Hyperlipidemia    Hypertension    IBS (irritable bowel syndrome)    Normal nuclear stress test 02/10/2006   low risk study    Obesity    Sciatica     Family History  Problem Relation Age of Onset   Acute myelogenous leukemia Mother        died when pat age 13    Diabetes Father    Lung cancer Maternal Grandmother    Skin cancer Maternal Grandfather    Breast cancer Paternal Grandmother    Diabetes Paternal Grandfather     Social History   Socioeconomic History   Marital status: Divorced    Spouse name: Not on file   Number of children: 3   Years of education: Not on file   Highest education level: Not on file  Occupational History   Occupation: retired  Tobacco Use   Smoking status: Never   Smokeless tobacco: Never  Substance and Sexual Activity   Alcohol use: No   Drug use: No   Sexual activity: Not on file  Other Topics Concern   Not on file  Social History Narrative   Separated single mom   HH of 4   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          Social Drivers of Corporate investment banker Strain: Low Risk  (10/03/2022)   Overall Financial Resource Strain (CARDIA)    Difficulty of Paying Living Expenses: Not hard at all  Food Insecurity: No Food Insecurity (10/03/2022)   Hunger Vital Sign    Worried About Running Out of Food in the Last Year: Never true    Ran Out of Food in the Last Year: Never true  Transportation Needs: No  Transportation Needs (10/03/2022)   PRAPARE - Administrator, Civil Service (Medical): No    Lack of Transportation (Non-Medical): No  Physical Activity: Inactive (10/03/2022)   Exercise Vital Sign    Days of Exercise per Week: 0 days    Minutes of Exercise per Session: 0 min  Stress: No Stress Concern Present (10/03/2022)   [employer] of Occupational Health - Occupational Stress Questionnaire    Feeling of Stress : Not at all  Social Connections: Socially Isolated (10/03/2022)   Social Connection and Isolation Panel [NHANES]    Frequency of Communication with Friends and Family: More than three times a week    Frequency of Social Gatherings with Friends and Family: More than three times a week    Attends Religious Services: Never    Database administrator or Organizations: No    Attends Banker Meetings: Never    Marital Status: Divorced    Outpatient Medications Prior to Visit  Medication Sig Dispense Refill   Accu-Chek Softclix Lancets  lancets 1 each by Other route in the morning, at noon, and at bedtime. Dx E11.9 100 each 6   atorvastatin  (LIPITOR) 40 MG tablet TAKE ONE TABLET BY MOUTH EVERY EVENING 90 tablet 1   BD PEN NEEDLE NANO 2ND GEN 32G X 4 MM MISC USE TO INJECT INSULIN  DAILY 100 each 5   Blood Glucose Monitoring Suppl (ACCU-CHEK GUIDE) w/Device KIT Daily use 1 kit 0   Cholecalciferol (VITAMIN D3) 1.25 MG (50000 UT) CAPS TAKE ONE CAPSULE BY MOUTH ONCE A WEEK 12 capsule 2   Continuous Glucose Sensor (DEXCOM G7 SENSOR) MISC 1 Device by Does not apply route every 14 (fourteen) days. 2 each 0   dapagliflozin  propanediol (FARXIGA ) 5 MG TABS tablet Take 1 tablet (5 mg total) by mouth daily before breakfast. 90 tablet 0   FIASP  FLEXTOUCH 100 UNIT/ML FlexTouch Pen Inject 6 Units into the skin. Once or twice a day     glucose blood (ACCU-CHEK GUIDE) test strip 1 each by Other route in the morning, at noon, and at bedtime. Dx E11.9 100 each 6   glucose  blood test strip Use to check blood sugar 1-2 times daily 100 each 3   insulin  degludec (TRESIBA  FLEXTOUCH) 100 UNIT/ML FlexTouch Pen Inject 22 Units into the skin daily. 3 mL 0   metFORMIN  (GLUCOPHAGE ) 1000 MG tablet TAKE ONE TABLET BY MOUTH AT NOON and TAKE ONE TABLET BY MOUTH EVERY EVENING 180 tablet 3   risedronate  (ACTONEL ) 150 MG tablet Take 1 tablet (150 mg total) by mouth every 30 (thirty) days. with water on empty stomach, nothing by mouth or lie down for next 30 minutes. 3 tablet 3   No facility-administered medications prior to visit.     EXAM:  There were no vitals taken for this visit.  There is no height or weight on file to calculate BMI. Wt Readings from Last 3 Encounters:  10/03/22 128 lb (58.1 kg)  07/01/22 124 lb 3.2 oz (56.3 kg)  01/22/22 116 lb (52.6 kg)    GENERAL: vitals reviewed and listed above, alert, oriented, appears well hydrated and in no acute distress HEENT: atraumatic, conjunctiva  clear, no obvious abnormalities on inspection of external nose and ears OP : no lesion edema or exudate  NECK: no obvious masses on inspection palpation  LUNGS: clear to auscultation bilaterally, no wheezes, rales or rhonchi, good air movement CV: HRRR, no clubbing cyanosis or  peripheral edema nl cap refill  MS: moves all extremities without noticeable focal  abnormality PSYCH: pleasant and cooperative, no obvious depression or anxiety Lab Results  Component Value Date   WBC 7.1 07/01/2022   HGB 15.0 07/01/2022   HCT 44.8 07/01/2022   PLT 368.0 07/01/2022   GLUCOSE 197 (H) 07/01/2022   CHOL 182 07/01/2022   TRIG 111.0 07/01/2022   HDL 75.10 07/01/2022   LDLDIRECT 105.0 08/27/2021   LDLCALC 84 07/01/2022   ALT 10 07/01/2022   AST 15 07/01/2022   NA 137 07/01/2022   K 4.5 07/01/2022   CL 101 07/01/2022   CREATININE 0.69 07/01/2022   BUN 14 07/01/2022   CO2 28 07/01/2022   TSH 1.23 07/01/2022   HGBA1C 7.1 (A) 07/01/2022   MICROALBUR 1.3 07/01/2022   BP  Readings from Last 3 Encounters:  07/01/22 116/68  01/29/22 108/62  01/22/22 (!) 142/87    ASSESSMENT AND PLAN:  Discussed the following assessment and plan:  Type 2 diabetes mellitus with hyperglycemia, without long-term current use of insulin  (HCC)  Vitamin D  deficiency  Hyperlipidemia, unspecified hyperlipidemia type  Medication management  -Patient advised to return or notify health care team  if  new concerns arise.  There are no Patient Instructions on file for this visit.   Tyasia Packard K. Naliya Gish M.D.

## 2023-06-02 ENCOUNTER — Ambulatory Visit (INDEPENDENT_AMBULATORY_CARE_PROVIDER_SITE_OTHER): Payer: Self-pay | Admitting: Internal Medicine

## 2023-06-02 ENCOUNTER — Ambulatory Visit: Payer: Self-pay | Admitting: Internal Medicine

## 2023-06-02 ENCOUNTER — Encounter: Payer: Self-pay | Admitting: Internal Medicine

## 2023-06-02 VITALS — BP 130/82 | HR 109 | Temp 97.5°F | Ht 63.0 in | Wt 134.6 lb

## 2023-06-02 DIAGNOSIS — E785 Hyperlipidemia, unspecified: Secondary | ICD-10-CM

## 2023-06-02 DIAGNOSIS — Z794 Long term (current) use of insulin: Secondary | ICD-10-CM | POA: Diagnosis not present

## 2023-06-02 DIAGNOSIS — I10 Essential (primary) hypertension: Secondary | ICD-10-CM

## 2023-06-02 DIAGNOSIS — M810 Age-related osteoporosis without current pathological fracture: Secondary | ICD-10-CM

## 2023-06-02 DIAGNOSIS — E1165 Type 2 diabetes mellitus with hyperglycemia: Secondary | ICD-10-CM

## 2023-06-02 DIAGNOSIS — E559 Vitamin D deficiency, unspecified: Secondary | ICD-10-CM

## 2023-06-02 DIAGNOSIS — Z91120 Patient's intentional underdosing of medication regimen due to financial hardship: Secondary | ICD-10-CM | POA: Diagnosis not present

## 2023-06-02 DIAGNOSIS — Z79899 Other long term (current) drug therapy: Secondary | ICD-10-CM | POA: Diagnosis not present

## 2023-06-02 DIAGNOSIS — H547 Unspecified visual loss: Secondary | ICD-10-CM

## 2023-06-02 LAB — BASIC METABOLIC PANEL WITH GFR
BUN: 12 mg/dL (ref 6–23)
CO2: 26 meq/L (ref 19–32)
Calcium: 8.9 mg/dL (ref 8.4–10.5)
Chloride: 101 meq/L (ref 96–112)
Creatinine, Ser: 0.58 mg/dL (ref 0.40–1.20)
GFR: 91.29 mL/min (ref >60.00)
Glucose, Bld: 168 mg/dL — ABNORMAL HIGH (ref 70–99)
Potassium: 3.9 meq/L (ref 3.5–5.1)
Sodium: 136 meq/L (ref 135–145)

## 2023-06-02 LAB — TSH: TSH: 1.43 u[IU]/mL (ref 0.35–5.50)

## 2023-06-02 LAB — CBC WITH DIFFERENTIAL/PLATELET
Basophils Absolute: 0.1 10*3/uL (ref 0.0–0.1)
Basophils Relative: 1.1 % (ref 0.0–3.0)
Eosinophils Absolute: 0.1 10*3/uL (ref 0.0–0.7)
Eosinophils Relative: 1.5 % (ref 0.0–5.0)
HCT: 44.2 % (ref 36.0–46.0)
Hemoglobin: 15.2 g/dL — ABNORMAL HIGH (ref 12.0–15.0)
Lymphocytes Relative: 27.9 % (ref 12.0–46.0)
Lymphs Abs: 2 10*3/uL (ref 0.7–4.0)
MCHC: 34.4 g/dL (ref 30.0–36.0)
MCV: 89.2 fl (ref 78.0–100.0)
Monocytes Absolute: 0.5 10*3/uL (ref 0.1–1.0)
Monocytes Relative: 7.3 % (ref 3.0–12.0)
Neutro Abs: 4.4 10*3/uL (ref 1.4–7.7)
Neutrophils Relative %: 62.2 % (ref 43.0–77.0)
Platelets: 350 10*3/uL (ref 150.0–400.0)
RBC: 4.95 Mil/uL (ref 3.87–5.11)
RDW: 13.1 % (ref 11.5–15.5)
WBC: 7.1 10*3/uL (ref 4.0–10.5)

## 2023-06-02 LAB — LIPID PANEL
Cholesterol: 219 mg/dL — ABNORMAL HIGH (ref 0–200)
HDL: 73.1 mg/dL (ref >39.00)
LDL Cholesterol: 117 mg/dL — ABNORMAL HIGH (ref 0–99)
NonHDL: 145.91
Total CHOL/HDL Ratio: 3
Triglycerides: 146 mg/dL (ref 0.0–149.0)
VLDL: 29.2 mg/dL (ref 0.0–40.0)

## 2023-06-02 LAB — MICROALBUMIN / CREATININE URINE RATIO
Creatinine,U: 90.7 mg/dL
Microalb Creat Ratio: 34.7 mg/g — ABNORMAL HIGH (ref 0.0–30.0)
Microalb, Ur: 3.1 mg/dL — ABNORMAL HIGH (ref 0.0–1.9)

## 2023-06-02 LAB — HEPATIC FUNCTION PANEL
ALT: 9 U/L (ref 0–35)
AST: 15 U/L (ref 0–37)
Albumin: 4 g/dL (ref 3.5–5.2)
Alkaline Phosphatase: 66 U/L (ref 39–117)
Bilirubin, Direct: 0.1 mg/dL (ref 0.0–0.3)
Total Bilirubin: 0.6 mg/dL (ref 0.2–1.2)
Total Protein: 6.6 g/dL (ref 6.0–8.3)

## 2023-06-02 LAB — VITAMIN D 25 HYDROXY (VIT D DEFICIENCY, FRACTURES): VITD: 46.76 ng/mL (ref 30.00–100.00)

## 2023-06-02 LAB — POCT GLYCOSYLATED HEMOGLOBIN (HGB A1C): Hemoglobin A1C: 9.5 % — AB (ref 4.0–5.6)

## 2023-06-02 NOTE — Patient Instructions (Signed)
 Sugars are out of control  today as  you expected.  Will as Shelvy Dickens to see if can help plan  on better insulins  coverage in day  you may be able .  To take even if a bit later.   Checking vit d today  Get eye ophthalmology appt .   The actonel  is for bone  health  osteoporosis .   And may not be too expensive   will also ask angela bout  cost.   Plan fu in  4 months or as needed.

## 2023-06-05 ENCOUNTER — Telehealth: Payer: Self-pay

## 2023-06-05 NOTE — Progress Notes (Signed)
   06/05/2023  Patient ID: [name] [name], female   DOB: 03/06/1950, 73 y.o.   MRN: [id number]  Attempted to contact patient to follow up on diabetes management and discuss dexcom sensors. Unable to leave voicemail, will continue to try to reach.  Carnell Christian, PharmD Clinical Pharmacist [phone]

## 2023-06-23 DIAGNOSIS — E1165 Type 2 diabetes mellitus with hyperglycemia: Secondary | ICD-10-CM | POA: Diagnosis not present

## 2023-07-01 ENCOUNTER — Other Ambulatory Visit: Payer: Self-pay

## 2023-07-01 DIAGNOSIS — Z794 Long term (current) use of insulin: Secondary | ICD-10-CM

## 2023-07-01 DIAGNOSIS — M810 Age-related osteoporosis without current pathological fracture: Secondary | ICD-10-CM

## 2023-07-01 DIAGNOSIS — E1165 Type 2 diabetes mellitus with hyperglycemia: Secondary | ICD-10-CM

## 2023-07-01 DIAGNOSIS — Z79899 Other long term (current) drug therapy: Secondary | ICD-10-CM

## 2023-07-01 NOTE — Progress Notes (Signed)
 07/01/2023 Name: [name] [name] MRN: [id number] DOB: 1950/05/18  Chief Complaint  Patient presents with   Medication Management   Diabetes    [name] [name] is a 73 y.o. year old female who presented for a telephone visit.   They were referred to the pharmacist by their PCP for assistance in managing diabetes and complex medication management.    Subjective:  Care Team: Primary Care Provider: Reginal Capra, MD ; Next Scheduled Visit: 09/30/23  Medication Access/Adherence  Current Pharmacy:  [id number] - [place], [place] - [address] [place] [address] Phone: [phone] Fax: [phone]  [facility] - [place], [place] - [address] [place] [address] Phone: [phone] Fax: [phone]   Patient reports affordability concerns with their medications: No Patient reports access/transportation concerns to their pharmacy: Yes  - relies on son for transport, okay with this at this time Patient reports adherence concerns with their medications:  Yes  - skips metformin  some if she eats out, had been skipping farxiga  if she didn't eat breakfast   Diabetes:  Current medications: Farxiga  5mg , Fiasp  8 units once to twice daily with meal (skips second injection if only one meal that day), Metformin , Tresiba  15 units daily Medications tried in the past: Glimepiride , Victoza , Actos   Current glucose readings: Denies any lows, does report some highs in low 300s if she misses insulin    Observed patterns:  Patient denies hypoglycemic s/sx including dizziness, shakiness, sweating. Patient denies hyperglycemic symptoms including polyuria, polydipsia, polyphagia, nocturia, neuropathy, blurred vision.  Current meal patterns:  -Only eats 1-2 meals per day  Current medication access support: Tresiba /Fiasp /Pen needles (NOVO), Farxiga  5mg  (renewal pending with AZ&Me)  Osteoporosis:  Current medications:  None Medications tried in the past: None (prescribed risedronate  but never started)  Current supplements: None  Most recent DEXA: 11/15/21, showed osteoporosis   Objective:  Lab Results  Component Value Date   HGBA1C 9.5 (A) 06/02/2023    Lab Results  Component Value Date   CREATININE 0.58 06/02/2023   BUN 12 06/02/2023   NA 136 06/02/2023   K 3.9 06/02/2023   CL 101 06/02/2023   CO2 26 06/02/2023    Lab Results  Component Value Date   CHOL 219 (H) 06/02/2023   HDL 73.10 06/02/2023   LDLCALC 117 (H) 06/02/2023   LDLDIRECT 105.0 08/27/2021   TRIG 146.0 06/02/2023   CHOLHDL 3 06/02/2023    Medications Reviewed Today     Reviewed by Carnell Christian, RPH (Pharmacist) on 07/01/23 at 1559  Med List Status: <None>   Medication Order Taking? Sig Documenting Provider Last Dose Status Informant  Accu-Chek Softclix Lancets lancets [id number]  1 each by Other route in the morning, at noon, and at bedtime. Dx E11.9 Panosh, Wanda K, MD  Active   atorvastatin  (LIPITOR) 40 MG tablet [id number] Yes TAKE ONE TABLET BY MOUTH EVERY EVENING Panosh, Wanda K, MD Taking Active   BD PEN NEEDLE NANO 2ND GEN 32G X 4 MM MISC [id number]  USE TO INJECT INSULIN  DAILY Webb, Padonda B, FNP  Active   Blood Glucose Monitoring Suppl (ACCU-CHEK GUIDE) w/Device KIT [id number]  Daily use Panosh, Wanda K, MD  Active   Cholecalciferol (VITAMIN D3) 1.25 MG (50000 UT) CAPS [id number] No TAKE ONE CAPSULE BY MOUTH ONCE A WEEK  Patient not taking: Reported on 07/01/2023   Rondall Codding, FNP Not Taking Active   Continuous Glucose Sensor (DEXCOM G7  SENSOR) MISC [id number] Yes 1 Device by Does not apply route every 14 (fourteen) days. Panosh, Joaquim Muir, MD Taking Active   dapagliflozin  propanediol (FARXIGA ) 5 MG TABS tablet [id number] Yes Take 1 tablet (5 mg total) by mouth daily before breakfast. Panosh, Joaquim Muir, MD Taking Active   FIASP  FLEXTOUCH 100 UNIT/ML FlexTouch Pen [id number]  Inject 6 Units into the skin. Once or  twice a day [provider]  Active   glucose blood (ACCU-CHEK GUIDE) test strip [id number]  1 each by Other route in the morning, at noon, and at bedtime. Dx E11.9 Panosh, Wanda K, MD  Active   glucose blood test strip [id number]  Use to check blood sugar 1-2 times daily Panosh, Wanda K, MD  Active   insulin  degludec (TRESIBA  FLEXTOUCH) 100 UNIT/ML FlexTouch Pen [id number] Yes Inject 22 Units into the skin daily. Panosh, Joaquim Muir, MD Taking Active            Med Note Joaquin Mulberry May 04, 2023  3:59 PM) 15 units daily  metFORMIN  (GLUCOPHAGE ) 1000 MG tablet [id number] Yes TAKE ONE TABLET BY MOUTH AT NOON and TAKE ONE TABLET BY MOUTH EVERY Ulus Gammon, Padonda B, FNP Taking Active   risedronate  (ACTONEL ) 150 MG tablet [id number]  Take 1 tablet (150 mg total) by mouth every 30 (thirty) days. with water on empty stomach, nothing by mouth or lie down for next 30 minutes.  Patient not taking: Reported on 06/02/2023   Reginal Capra, MD  Active            Med Note Zilphia Hilt, Daria Eddy   Mon May 04, 2023  3:59 PM) Never started              Assessment/Plan:   Diabetes: - Currently uncontrolled - Reviewed long term cardiovascular and renal outcomes of uncontrolled blood sugar - Reviewed goal A1c, goal fasting, and goal 2 hour post prandial glucose - Reviewed dietary modifications including low carb diet - Recommend to continue current medication therapy, stressed the importance of adherence  -Counseled on proper timing of all medications and tips for remembering missed doses - Patient denies personal or family history of multiple endocrine neoplasia type 2, medullary thyroid  cancer; personal history of pancreatitis or gallbladder disease. - Recommend to check glucose daily  Osteoporosis: - Reviewed recommendation for daily calcium  intake of 1200 mg and vitamin D  intake of [phone] units - Reviewed benefits of weight bearing exercise - Recommend to START Alendronate once  weekly, reviewed proper use with patient, pending PCP sign off upon her return    Follow Up Plan: 1 month  Carnell Christian, PharmD Clinical Pharmacist [phone]

## 2023-07-21 ENCOUNTER — Telehealth: Payer: Self-pay

## 2023-07-21 NOTE — Telephone Encounter (Signed)
 Copied from CRM [id number]. Topic: General - Other >> Jul 21, 2023  2:57 PM Juleen Oakland F wrote: Reason for CRM: Patient returned Kendra's phone call, I let her know that assistant medication if ready for pick up. She said if she doesn't pick it up today she will pick it up tomorrow.

## 2023-07-21 NOTE — Telephone Encounter (Signed)
 Called pt to advised that pt assistant medication is ready for pick up. However, there was no answer. Call Shane(son) and no answer of vm set up.

## 2023-07-29 ENCOUNTER — Other Ambulatory Visit

## 2023-07-29 DIAGNOSIS — M810 Age-related osteoporosis without current pathological fracture: Secondary | ICD-10-CM

## 2023-07-29 DIAGNOSIS — Z794 Long term (current) use of insulin: Secondary | ICD-10-CM

## 2023-07-29 DIAGNOSIS — E1165 Type 2 diabetes mellitus with hyperglycemia: Secondary | ICD-10-CM

## 2023-07-29 NOTE — Progress Notes (Unsigned)
 07/29/2023 Name: Shaquna Geigle MRN: [id number] DOB: March 22, 1950  Chief Complaint  Patient presents with   Medication Management    Brizza Nathanson is a 73 y.o. year old female who presented for a telephone visit.   They were referred to the pharmacist by their PCP for assistance in managing diabetes and complex medication management.    Subjective:  Care Team: Primary Care Provider: Reginal Capra, MD ; Next Scheduled Visit: 09/30/23  Medication Access/Adherence  Current Pharmacy:  [id number] - [place], [place] - [address] [place] [address] Phone: [phone] Fax: [phone]  [facility] - [place], [place] - [address] [place] [address] Phone: [phone] Fax: [phone]   Patient reports affordability concerns with their medications: No Patient reports access/transportation concerns to their pharmacy: Yes  - relies on son for transport, okay with this at this time Patient reports adherence concerns with their medications:  Yes  - skips metformin  some if she eats out, had been skipping farxiga  if she didn't eat breakfast   Diabetes:  Current medications: Farxiga  5mg , Fiasp  8 units once to twice daily with meal (skips second injection if only one meal that day), Metformin , Tresiba  15 units daily Medications tried in the past: Glimepiride , Victoza , Actos   Current glucose readings:  3 day avg 198 7 day avg 218 14 day avg 227 30 day avg 222   Observed patterns:  Patient denies hypoglycemic s/sx including dizziness, shakiness, sweating. Patient denies hyperglycemic symptoms including polyuria, polydipsia, polyphagia, nocturia, neuropathy, blurred vision.  Current meal patterns:  -Only eats 1-2 meals per day  Still forgetting farxiga , reports she forgot that it was okay to take later in the day if she wanted. Reports she has been better about remembering the fiasp   Current medication access  support: Tresiba /Fiasp /Pen needles ([facility]), Farxiga  5mg  (renewal pending with AZ&Me)  Osteoporosis:  Current medications: None Medications tried in the past: None (prescribed risedronate  but never started)  Current supplements: None  Most recent DEXA: 11/15/21, showed osteoporosis   Objective:  Lab Results  Component Value Date   HGBA1C 9.5 (A) 06/02/2023    Lab Results  Component Value Date   CREATININE 0.58 06/02/2023   BUN 12 06/02/2023   NA 136 06/02/2023   K 3.9 06/02/2023   CL 101 06/02/2023   CO2 26 06/02/2023    Lab Results  Component Value Date   CHOL 219 (H) 06/02/2023   HDL 73.10 06/02/2023   LDLCALC 117 (H) 06/02/2023   LDLDIRECT 105.0 08/27/2021   TRIG 146.0 06/02/2023   CHOLHDL 3 06/02/2023    Medications Reviewed Today     Reviewed by Carnell Christian, RPH (Pharmacist) on 07/29/23 at 1529  Med List Status: <None>   Medication Order Taking? Sig Documenting Provider Last Dose Status Informant  Accu-Chek Softclix Lancets lancets [id number] No 1 each by Other route in the morning, at noon, and at bedtime. Dx E11.9 Reginal Capra, MD Taking Active   atorvastatin  (LIPITOR) 40 MG tablet [id number] No TAKE ONE TABLET BY MOUTH EVERY EVENING Panosh, Joaquim Muir, MD Taking Active   BD PEN NEEDLE NANO 2ND GEN 32G X 4 MM MISC [id number] No USE TO INJECT INSULIN  DAILY Webb, Padonda B, FNP Taking Active   Blood Glucose Monitoring Suppl (ACCU-CHEK GUIDE) w/Device KIT [id number] No Daily use Panosh, Wanda K, MD Taking Active   Cholecalciferol (VITAMIN D3) 1.25 MG (50000 UT) CAPS [id number] No TAKE ONE  CAPSULE BY MOUTH ONCE A WEEK  Patient not taking: Reported on 07/01/2023   Rondall Codding, FNP Not Taking Active   Continuous Glucose Sensor (DEXCOM G7 SENSOR) MISC [id number] No 1 Device by Does not apply route every 14 (fourteen) days. Panosh, Joaquim Muir, MD Taking Active   dapagliflozin  propanediol (FARXIGA ) 5 MG TABS tablet [id number] No Take 1 tablet (5 mg total) by mouth daily  before breakfast. Panosh, Wanda K, MD Taking Active   FIASP  FLEXTOUCH 100 UNIT/ML FlexTouch Pen [id number] No Inject 6 Units into the skin. Once or twice a day [provider] Taking Active   glucose blood (ACCU-CHEK GUIDE) test strip [id number] No 1 each by Other route in the morning, at noon, and at bedtime. Dx E11.9 Panosh, Wanda K, MD Taking Active   glucose blood test strip [id number] No Use to check blood sugar 1-2 times daily Panosh, Wanda K, MD Taking Active   insulin  degludec (TRESIBA  FLEXTOUCH) 100 UNIT/ML FlexTouch Pen [id number] No Inject 22 Units into the skin daily. Panosh, Joaquim Muir, MD Taking Active            Med Note Joaquin Mulberry May 04, 2023  3:59 PM) 15 units daily  metFORMIN  (GLUCOPHAGE ) 1000 MG tablet [id number] No TAKE ONE TABLET BY MOUTH AT NOON and TAKE ONE TABLET BY MOUTH EVERY Ulus Gammon, Padonda B, FNP Taking Active   risedronate  (ACTONEL ) 150 MG tablet [id number] No Take 1 tablet (150 mg total) by mouth every 30 (thirty) days. with water on empty stomach, nothing by mouth or lie down for next 30 minutes.  Patient not taking: Reported on 06/02/2023   Reginal Capra, MD Not Taking Active            Med Note Zilphia Hilt, Daria Eddy   Mon May 04, 2023  3:59 PM) Never started              Assessment/Plan:   Diabetes: - Currently uncontrolled - Reviewed long term cardiovascular and renal outcomes of uncontrolled blood sugar - Reviewed goal A1c, goal fasting, and goal 2 hour post prandial glucose - Reviewed dietary modifications including low carb diet - Recommend to continue current medication therapy, stressed the importance of adherence  -Counseled on proper timing of all medications and tips for remembering missed doses - Patient denies personal or family history of multiple endocrine neoplasia type 2, medullary thyroid  cancer; personal history of pancreatitis or gallbladder disease. - Recommend to check glucose daily. Provided sensor sample as  there is not currently away to assist patient with cost  Osteoporosis: - Reviewed recommendation for daily calcium  intake of 1200 mg and vitamin D  intake of [phone] units - Reviewed benefits of weight bearing exercise - Recommend to START Alendronate once weekly, reviewed proper use with patient, pending PCP sign off upon her return    Follow Up Plan: 1 month  Carnell Christian, PharmD Clinical Pharmacist [phone]

## 2023-08-05 MED ORDER — METFORMIN HCL 1000 MG PO TABS: ORAL_TABLET | ORAL | 3 refills | Status: AC

## 2023-08-05 MED ORDER — ATORVASTATIN CALCIUM 40 MG PO TABS
40.0000 mg | ORAL_TABLET | Freq: Every evening | ORAL | 1 refills | Status: AC
Start: 2023-08-05 — End: ?

## 2023-08-05 MED ORDER — ALENDRONATE SODIUM 70 MG PO TABS: 70.0000 mg | ORAL_TABLET | ORAL | 2 refills | Status: DC

## 2023-08-07 DIAGNOSIS — E1165 Type 2 diabetes mellitus with hyperglycemia: Secondary | ICD-10-CM | POA: Diagnosis not present

## 2023-08-12 ENCOUNTER — Telehealth: Payer: Self-pay

## 2023-08-12 NOTE — Progress Notes (Signed)
   08/12/2023  Patient ID: [name] [name], female   DOB: May 26, 1950, 73 y.o.   MRN: [id number]  Received request via parachute portal for updated documentation for patient's dexcom g7 sensor orders. Provided requested info, order pending.  Jon VEAR Lindau, PharmD Clinical Pharmacist [phone]

## 2023-08-31 ENCOUNTER — Telehealth: Payer: Self-pay | Admitting: Internal Medicine

## 2023-08-31 NOTE — Telephone Encounter (Signed)
 Copied from CRM [id number]. Topic: Appointments - Scheduling Inquiry for Clinic >> Aug 31, 2023  4:22 PM Corin V wrote: Reason for CRM: Patient cancelled her pharmacy appointment with Jon Lindau and would like to try and reschedule for the second week in August if possible. Please call back at  [phone]

## 2023-09-02 ENCOUNTER — Other Ambulatory Visit

## 2023-09-02 ENCOUNTER — Telehealth: Payer: Self-pay

## 2023-09-02 NOTE — Progress Notes (Signed)
   09/02/2023  Patient ID: [name] [name], female   DOB: [birth year], 73 y.o.   MRN: [id number]  Patient called in saying she took her first dose of alendronate  on 08/31/23. Reports that about 24 hours later pain/aches throughout her back has begun and has not resolved with tylenol.  Counseled patient that it is okay for her to take OTC ibuprofen with food to see if this helps. It may be the alendronate , offered patient to schedule an in office visit with provider here for evaluation and she declines at this time.   Patient wishes to no longer take alendronate . Plans to discuss with PCP in August to consider alternatives. Will call office for eval if back pain does not improve.  Scheduled our follow up for 1 month after in Sept  Jon VEAR Lindau, PharmD Clinical Pharmacist [phone]

## 2023-09-21 ENCOUNTER — Ambulatory Visit: Payer: Self-pay | Admitting: Internal Medicine

## 2023-09-21 NOTE — Progress Notes (Signed)
 Has upcoming visit in August   has been followed also by pharmacy team  see notes

## 2023-09-30 ENCOUNTER — Ambulatory Visit: Admitting: Internal Medicine

## 2023-09-30 ENCOUNTER — Encounter: Payer: Self-pay | Admitting: Internal Medicine

## 2023-09-30 VITALS — BP 104/70 | HR 91 | Temp 98.1°F | Ht 63.0 in | Wt 141.8 lb

## 2023-09-30 DIAGNOSIS — M810 Age-related osteoporosis without current pathological fracture: Secondary | ICD-10-CM | POA: Diagnosis not present

## 2023-09-30 DIAGNOSIS — E1165 Type 2 diabetes mellitus with hyperglycemia: Secondary | ICD-10-CM

## 2023-09-30 DIAGNOSIS — Z79899 Other long term (current) drug therapy: Secondary | ICD-10-CM | POA: Diagnosis not present

## 2023-09-30 DIAGNOSIS — Z794 Long term (current) use of insulin: Secondary | ICD-10-CM

## 2023-09-30 DIAGNOSIS — H547 Unspecified visual loss: Secondary | ICD-10-CM

## 2023-09-30 DIAGNOSIS — T887XXA Unspecified adverse effect of drug or medicament, initial encounter: Secondary | ICD-10-CM

## 2023-09-30 LAB — POCT GLYCOSYLATED HEMOGLOBIN (HGB A1C): Hemoglobin A1C: 8.6 % — AB (ref 4.0–5.6)

## 2023-09-30 NOTE — Progress Notes (Signed)
 Chief Complaint  Patient presents with   Medical Management of Chronic Issues    Pt is here for f/u. Discuss medication for bone health. Pt reports she had took Alendronate , had reaction of back pain and chest pain. Lasted 3 days.      HPI: [name] [name] 73 y.o. come in for Chronic disease management  DM :working with pharmacy team on faxiga 5 15 tresiba  8 units bid with meals Fiasp . Metformin  1000 bid . Bg can shoot up to 300 after meal but baseline in 200 range. Amd sugars ? Low 100 or not.  No sx lows. Hasnt gotten eye chek yet.  Son drives her to appts    Began fosamax  x 1 taken as directed and the next day awoke with sig pain along upper back and chest   not assoc with swallowing  other . Bad enough she almost went to ed  then was gone and no return feels it was from medication .  ROS: See pertinent positives and negatives per HPI. No syncope other medial changes no current cp sob syncope   Past Medical History:  Diagnosis Date   Allergic rhinitis    Anxiety    Diabetes mellitus    Gallstones    Hyperlipidemia    Hypertension    IBS (irritable bowel syndrome)    Normal nuclear stress test 02/10/2006   low risk study    Obesity    Sciatica     Family History  Problem Relation Age of Onset   Acute myelogenous leukemia Mother        died when pat age 65    Diabetes Father    Lung cancer Maternal Grandmother    Skin cancer Maternal Grandfather    Breast cancer Paternal Grandmother    Diabetes Paternal Grandfather     Social History   Socioeconomic History   Marital status: Divorced    Spouse name: Not on file   Number of children: 3   Years of education: Not on file   Highest education level: Not on file  Occupational History   Occupation: retired  Tobacco Use   Smoking status: Never   Smokeless tobacco: Never  Substance and Sexual Activity   Alcohol use: No   Drug use: No   Sexual activity: Not on file  Other Topics Concern   Not on file  Social  History Narrative   Separated single mom   HH of 4   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          Social Drivers of Corporate investment banker Strain: Low Risk  (10/03/2022)   Overall Financial Resource Strain (CARDIA)    Difficulty of Paying Living Expenses: Not hard at all  Food Insecurity: No Food Insecurity (10/03/2022)   Hunger Vital Sign    Worried About Running Out of Food in the Last Year: Never true    Ran Out of Food in the Last Year: Never true  Transportation Needs: No Transportation Needs (10/03/2022)   PRAPARE - Administrator, Civil Service (Medical): No    Lack of Transportation (Non-Medical): No  Physical Activity: Inactive (10/03/2022)   Exercise Vital Sign    Days of  Exercise per Week: 0 days    Minutes of Exercise per Session: 0 min  Stress: No Stress Concern Present (10/03/2022)   [employer] of Occupational Health - Occupational Stress Questionnaire    Feeling of Stress : Not at all  Social Connections: Socially Isolated (10/03/2022)   Social Connection and Isolation Panel    Frequency of Communication with Friends and Family: More than three times a week    Frequency of Social Gatherings with Friends and Family: More than three times a week    Attends Religious Services: Never    Database administrator or Organizations: No    Attends Banker Meetings: Never    Marital Status: Divorced    Outpatient Medications Prior to Visit  Medication Sig Dispense Refill   Accu-Chek Softclix Lancets lancets 1 each by Other route in the morning, at noon, and at bedtime. Dx E11.9 100 each 6   atorvastatin  (LIPITOR) 40 MG tablet Take 1 tablet (40 mg total) by mouth every evening. 90 tablet 1   BD PEN NEEDLE NANO 2ND GEN 32G X 4 MM MISC USE TO INJECT INSULIN  DAILY 100  each 5   Blood Glucose Monitoring Suppl (ACCU-CHEK GUIDE) w/Device KIT Daily use 1 kit 0   Continuous Glucose Sensor (DEXCOM G7 SENSOR) MISC 1 Device by Does not apply route every 14 (fourteen) days. 2 each 0   dapagliflozin  propanediol (FARXIGA ) 5 MG TABS tablet Take 1 tablet (5 mg total) by mouth daily before breakfast. 90 tablet 0   FIASP  FLEXTOUCH 100 UNIT/ML FlexTouch Pen Inject 6 Units into the skin. Once or twice a day     glucose blood (ACCU-CHEK GUIDE) test strip 1 each by Other route in the morning, at noon, and at bedtime. Dx E11.9 100 each 6   glucose blood test strip Use to check blood sugar 1-2 times daily 100 each 3   insulin  degludec (TRESIBA  FLEXTOUCH) 100 UNIT/ML FlexTouch Pen Inject 22 Units into the skin daily. 3 mL 0   metFORMIN  (GLUCOPHAGE ) 1000 MG tablet TAKE ONE TABLET BY MOUTH twice daily 180 tablet 3   Cholecalciferol (VITAMIN D3) 1.25 MG (50000 UT) CAPS TAKE ONE CAPSULE BY MOUTH ONCE A WEEK (Patient not taking: Reported on 09/30/2023) 12 capsule 2   alendronate  (FOSAMAX ) 70 MG tablet Take 1 tablet (70 mg total) by mouth every 7 (seven) days. Take with a full glass of water on an empty stomach. (Patient not taking: Reported on 09/30/2023) 4 tablet 2   No facility-administered medications prior to visit.     EXAM:  BP 104/70 (BP Location: Left Arm, Patient Position: Sitting, Cuff Size: Normal)   Pulse 91   Temp 98.1 F (36.7 C) (Oral)   Ht 5' 3 (1.6 m)   Wt 141 lb 12.8 oz (64.3 kg)   SpO2 98%   BMI 25.12 kg/m   Body mass index is 25.12 kg/m.  GENERAL: vitals reviewed and listed above, alert, oriented, appears well hydrated and in no acute distress HEENT: atraumatic, conjunctiva  clear, no obvious abnormalities on inspection of external nose and ears  NECK: no obvious masses on inspection palpation  LUNGS: clear to auscultation bilaterally, no wheezes, rales or rhonchi, good air movement CV: HRRR, no clubbing cyanosis or  peripheral edema nl cap refill   Abdomen:  Sof,t normal bowel sounds without hepatosplenomegaly, no guarding rebound or masses no CVA tenderness MS: moves all extremities without noticeable focal  abnormality PSYCH: pleasant and cooperative, no obvious depression or  anxiety Lab Results  Component Value Date   WBC 7.1 06/02/2023   HGB 15.2 (H) 06/02/2023   HCT 44.2 06/02/2023   PLT 350.0 06/02/2023   GLUCOSE 168 (H) 06/02/2023   CHOL 219 (H) 06/02/2023   TRIG 146.0 06/02/2023   HDL 73.10 06/02/2023   LDLDIRECT 105.0 08/27/2021   LDLCALC 117 (H) 06/02/2023   ALT 9 06/02/2023   AST 15 06/02/2023   NA 136 06/02/2023   K 3.9 06/02/2023   CL 101 06/02/2023   CREATININE 0.58 06/02/2023   BUN 12 06/02/2023   CO2 26 06/02/2023   TSH 1.43 06/02/2023   HGBA1C 8.6 (A) 09/30/2023   MICROALBUR 3.1 (H) 06/02/2023   BP Readings from Last 3 Encounters:  09/30/23 104/70  06/02/23 130/82  07/01/22 116/68  Last vitamin D  Lab Results  Component Value Date   VD25OH 46.76 06/02/2023      ASSESSMENT AND PLAN:  Discussed the following assessment and plan:  Age-related osteoporosis by dexa  without current pathological fracture  Medication side effect  Uncontrolled diabetes mellitus with hyperglycemia, with long-term current use of insulin  (HCC) - Plan: POC HgB A1c  Medication management  Impaired vision Encouraged to get her vision exam  Try inc fiasp  to 10 if 200 baseline pre meal and up  ok to use 8 ( as current) if below 200 . Her am bgs she says are often low 100s  but no hypoglycemic episodes .  Consider inc farxiga  to 10 ?   Uncertain next best step for her osteoporosis rx   seems like had se  when taken correctly   No current dysphagia  or gi sx back pain chest pain of report .  Uncertain  if reclast in fusion  would  give the same problem as oral class.  Optimize weight bearing  resistance exericse as possible.   May do better in a program?   -Patient advised to return or notify health care team  if   new concerns arise.  Patient Instructions  Try 10 units if baseline before meal 200   If below  200  cont the 8  units with meal.   WIll explore   using 10 mg farxiga  instead of  5 .   I agree that the alendronate  may have caused a side effect .   Other options   are injections and infusions.  I may have  you see a specialist about this  to help decide what may be the next best choice .   A1c  is 8.6 today .  Will  work with Jon also .    Bronwyn Belasco K. Molley Houser M.D.

## 2023-09-30 NOTE — Patient Instructions (Signed)
 Try 10 units if baseline before meal 200   If below  200  cont the 8  units with meal.   WIll explore   using 10 mg farxiga  instead of  5 .   I agree that the alendronate  may have caused a side effect .   Other options   are injections and infusions.  I may have  you see a specialist about this  to help decide what may be the next best choice .   A1c  is 8.6 today .  Will  work with Jon also .

## 2023-10-07 ENCOUNTER — Telehealth: Payer: Self-pay

## 2023-10-07 NOTE — Progress Notes (Signed)
   10/07/2023  Patient ID: [name] [name], female   DOB: 19-Jan-1951, 74 y.o.   MRN: [id number]  Attempted to contact patient to follow up on recent PCP visit to discuss increasing farxiga  and starting medication alternative for osteoporosis (boniva). Unable to leave a voicemail. Will continue to attempt to reach.  Faxed in refill to [facility] for patient's pen needles and insulin  at company request for re-order for 4 month supply  Jon VEAR Lindau, PharmD Clinical Pharmacist [phone]

## 2023-10-13 ENCOUNTER — Telehealth: Payer: Self-pay | Admitting: Pharmacy Technician

## 2023-10-13 NOTE — Progress Notes (Signed)
   10/13/2023  Patient ID: [name] [name], female   DOB: [birth year], 73 y.o.   MRN: [id number]  Patient engaged with clinical pharmacist for management of diabetes on 07/29/2023. Outreach by [employer] technician was requested.   Outreached patient to discuss diabetes medication management. Left voicemail for patient to return my call at their convenience.   Faylynn Stamos, CPhT [facility] Office: [phone] Email: [email or web address]

## 2023-10-28 ENCOUNTER — Other Ambulatory Visit (INDEPENDENT_AMBULATORY_CARE_PROVIDER_SITE_OTHER)

## 2023-10-28 DIAGNOSIS — M810 Age-related osteoporosis without current pathological fracture: Secondary | ICD-10-CM

## 2023-10-28 DIAGNOSIS — E1165 Type 2 diabetes mellitus with hyperglycemia: Secondary | ICD-10-CM

## 2023-10-28 DIAGNOSIS — Z79899 Other long term (current) drug therapy: Secondary | ICD-10-CM

## 2023-10-28 NOTE — Progress Notes (Signed)
 10/28/2023 Name: Raymona Boss MRN: [id number] DOB: 08-23-1950  Chief Complaint  Patient presents with   Medication Management   Diabetes    Kellyann Ordway is a 73 y.o. year old female who presented for a telephone visit.   They were referred to the pharmacist by their PCP for assistance in managing diabetes and complex medication management.    Subjective:  Care Team: Primary Care Provider: Charlett Apolinar POUR, MD ; Next Scheduled Visit: 01/26/24  Medication Access/Adherence  Current Pharmacy:  [id number] - [place], [place] - [address] [place] [address] Phone: [phone] Fax: [phone]  [facility] - [place], [place] - [address] [place] [address] Phone: [phone] Fax: [phone]   Patient reports affordability concerns with their medications: No Patient reports access/transportation concerns to their pharmacy: Yes  - relies on son for transport, okay with this at this time Patient reports adherence concerns with their medications:  Yes  - skips metformin  some if she eats out, had been skipping farxiga  if she didn't eat breakfast   Diabetes:  Current medications: Farxiga  10mg , Fiasp  10 units once to twice daily with meal (skips second injection if only one meal that day), Metformin , Tresiba  15 units daily Medications tried in the past: Glimepiride , Victoza , Actos   Current glucose readings:  Reports improved glucose readings since increasing fiasp  and been better about remembering her medications. No specific readings to provide today.   Observed patterns:  Patient denies hypoglycemic s/sx including dizziness, shakiness, sweating. Patient denies hyperglycemic symptoms including polyuria, polydipsia, polyphagia, nocturia, neuropathy, blurred vision.  Current meal patterns:  -Only eats 1-2 meals per day  Still forgetting farxiga , reports she forgot that it was okay to take later in the day  if she wanted. Reports she has been better about remembering the fiasp   Current medication access support: Tresiba /Fiasp /Pen needles (NOVO), Farxiga  5mg  (renewal pending with AZ&Me)  Osteoporosis:  Current medications: None Medications tried in the past: Alendronate  (reports severe, intolerable muscle aches)  Current supplements: None  Most recent DEXA: 11/15/21, showed osteoporosis   Objective:  Lab Results  Component Value Date   HGBA1C 8.6 (A) 09/30/2023    Lab Results  Component Value Date   CREATININE 0.58 06/02/2023   BUN 12 06/02/2023   NA 136 06/02/2023   K 3.9 06/02/2023   CL 101 06/02/2023   CO2 26 06/02/2023    Lab Results  Component Value Date   CHOL 219 (H) 06/02/2023   HDL 73.10 06/02/2023   LDLCALC 117 (H) 06/02/2023   LDLDIRECT 105.0 08/27/2021   TRIG 146.0 06/02/2023   CHOLHDL 3 06/02/2023    Medications Reviewed Today     Reviewed by Lionell Jon DEL, RPH (Pharmacist) on 10/28/23 at 1620  Med List Status: <None>   Medication Order Taking? Sig Documenting Provider Last Dose Status Informant  Accu-Chek Softclix Lancets lancets [id number]  1 each by Other route in the morning, at noon, and at bedtime. Dx E11.9 Panosh, Wanda K, MD  Active   atorvastatin  (LIPITOR) 40 MG tablet [id number] Yes Take 1 tablet (40 mg total) by mouth every evening. Panosh, Apolinar POUR, MD  Active   BD PEN NEEDLE NANO 2ND GEN 32G X 4 MM MISC [id number]  USE TO INJECT INSULIN  DAILY Webb, Padonda B, FNP  Active   Blood Glucose Monitoring Suppl (ACCU-CHEK GUIDE) w/Device KIT [id number]  Daily use Panosh, Wanda K, MD  Active   Cholecalciferol (VITAMIN  D3) 1.25 MG (50000 UT) CAPS [id number]  TAKE ONE CAPSULE BY MOUTH ONCE A WEEK  Patient not taking: Reported on 09/30/2023   Webb, Padonda B, FNP  Active   Continuous Glucose Sensor (DEXCOM G7 SENSOR) MISC [id number]  1 Device by Does not apply route every 14 (fourteen) days. Panosh, Apolinar POUR, MD  Active   dapagliflozin  propanediol (FARXIGA ) 5  MG TABS tablet [id number]  Take 1 tablet (5 mg total) by mouth daily before breakfast. Panosh, Wanda K, MD  Active   FIASP  FLEXTOUCH 100 UNIT/ML FlexTouch Pen [id number]  Inject 6 Units into the skin. Once or twice a day [provider]  Active   glucose blood (ACCU-CHEK GUIDE) test strip [id number]  1 each by Other route in the morning, at noon, and at bedtime. Dx E11.9 Panosh, Wanda K, MD  Active   glucose blood test strip [id number]  Use to check blood sugar 1-2 times daily Panosh, Wanda K, MD  Active   insulin  degludec (TRESIBA  FLEXTOUCH) 100 UNIT/ML FlexTouch Pen [id number]  Inject 22 Units into the skin daily. Panosh, Apolinar POUR, MD  Active            Med Note JANEAN JON DEL   Mon May 04, 2023  3:59 PM) 15 units daily  metFORMIN  (GLUCOPHAGE ) 1000 MG tablet [id number] Yes TAKE ONE TABLET BY MOUTH twice daily Panosh, Wanda K, MD  Active               Assessment/Plan:   Diabetes: - Currently uncontrolled - Reviewed long term cardiovascular and renal outcomes of uncontrolled blood sugar - Reviewed goal A1c, goal fasting, and goal 2 hour post prandial glucose - Reviewed dietary modifications including low carb diet - Recommend to continue current medication therapy, stressed the importance of adherence  -Counseled on proper timing of all medications and tips for remembering missed doses - Patient denies personal or family history of multiple endocrine neoplasia type 2, medullary thyroid  cancer; personal history of pancreatitis or gallbladder disease. - Recommend to check glucose daily. Provided sensor samples as patient reports cost is still prohibitive at local pharmacy  Osteoporosis: - Reviewed recommendation for daily calcium  intake of 1200 mg and vitamin D  intake of [phone] units - Reviewed benefits of weight bearing exercise - Recommend to START Boniva, patient declines, prefers to continue calcium  + vit d   Follow Up Plan: 1 month  JON DEL Lindau, PharmD Clinical  Pharmacist [phone]

## 2023-10-29 ENCOUNTER — Telehealth: Payer: Self-pay

## 2023-10-29 NOTE — Telephone Encounter (Signed)
 Gave [facility] a call to follow up on pt's refill's on Tresiba ,Fiast and needles,spoke with [facility] representative said they started refill on 10/12/23 and it takes 10-14 days to mail to provider office,pt should received in a few days.

## 2023-11-13 ENCOUNTER — Telehealth: Payer: Self-pay

## 2023-11-13 NOTE — Telephone Encounter (Signed)
 Attempted to reach pt and inform her PAP supplies has arrived- Tresiba , Fiasp  Flextouch and Novofine.   Voicemail has not been set up. Will try again.

## 2023-11-16 NOTE — Telephone Encounter (Signed)
 Spoke to pt and inform pt that her PAP supplies arrived. Pt states she will pick it up tomorrow. Pt's novofine needles was given to FD, Tammy. Inform Tammy that pt also has supplies in the fridge.   No further action is needed.

## 2023-11-19 ENCOUNTER — Telehealth: Payer: Self-pay

## 2023-11-19 NOTE — Telephone Encounter (Signed)
 Gave pt a call pt is due for re-enrollment AZ[employer]ME Farxiga ,left a HIPAA VM,pt needs to call AZ[employer]ME and give consent for 2026 re-enrollment.

## 2023-11-24 ENCOUNTER — Telehealth: Payer: Self-pay

## 2023-11-24 NOTE — Progress Notes (Signed)
   11/24/2023  Patient ID: [name] [name], female   DOB: 06/22/1950, 73 y.o.   MRN: [id number]  Received fax notification from [employer]Me that patient has been conditionally approved to receive Farxiga  for 2026. In order to get full approval for 2026 and continue to receive product, patient needs to provide consent that they acknowledge the terms and conditions of the program.   This can be done in the following ways: 1) Patient can go to [email or web address] to provide consent via digital assistant 2) Call [phone] to provide verbal consent 3) Complete the paper application   AZ[employer]Me Patient ID: [id number]   Forwarding to CPhT to assist with enrollment process for patient.   Jon VEAR Lindau, PharmD Clinical Pharmacist [phone]

## 2023-12-08 ENCOUNTER — Telehealth: Payer: Self-pay

## 2023-12-08 NOTE — Progress Notes (Signed)
   12/08/2023  Patient ID: [name] [name], female   DOB: 09-21-1950, 73 y.o.   MRN: [id number]  Pharmacy Quality Measure Review  This patient is appearing on a report for being at risk of failing the adherence measure for cholesterol (statin) and diabetes medications this calendar year.   Medication: Metformin  and Atorvastatin  Last fill date: 08/05/23 for 90 day supply  Contacted pharmacy to facilitate refills.  Jon VEAR Lindau, PharmD Clinical Pharmacist [phone]

## 2023-12-12 ENCOUNTER — Other Ambulatory Visit: Payer: Self-pay | Admitting: Internal Medicine

## 2023-12-14 ENCOUNTER — Other Ambulatory Visit

## 2023-12-14 DIAGNOSIS — E1165 Type 2 diabetes mellitus with hyperglycemia: Secondary | ICD-10-CM

## 2023-12-14 MED ORDER — DEXCOM G7 SENSOR MISC: 5 refills | Status: AC

## 2023-12-14 NOTE — Progress Notes (Signed)
 12/14/2023 Name: [name] [name] MRN: [id number] DOB: [birth year]  Chief Complaint  Patient presents with   Medication Management   Diabetes    [name] [name] is a 73 y.o. year old female who presented for a telephone visit.   They were referred to the pharmacist by their PCP for assistance in managing diabetes and complex medication management.    Subjective:  Care Team: Primary Care Provider: Charlett Apolinar POUR, MD ; Next Scheduled Visit: 01/26/24  Medication Access/Adherence  [id number] [place], [place] - [address] [place] [address] Phone: [phone] Fax: [phone]  [facility] - [place], [place] - [address] [place] [address] Phone: [phone] Fax: [phone]   Patient reports affordability concerns with their medications: No Patient reports access/transportation concerns to their pharmacy: Yes  - relies on son for transport, okay with this at this time Patient reports adherence concerns with their medications:  No - reports she has been much better about remembering her medications   Diabetes:  Current medications: Farxiga  10mg , Fiasp  10 units once to twice daily with meal (skips second injection if only one meal that day), Metformin , Tresiba  15 units daily Medications tried in the past: Glimepiride , Victoza , Actos   Current glucose readings:  Reports improved glucose readings since increasing fiasp  and been better about remembering her medications. No specific readings to provide today.   Observed patterns:  Patient denies hypoglycemic s/sx including dizziness, shakiness, sweating. Patient denies hyperglycemic symptoms including polyuria, polydipsia, polyphagia, nocturia, neuropathy, blurred vision.  Current meal patterns:  -Only eats 1-2 meals per day   Current medication access support: Tresiba /Fiasp /Pen needles (NOVO), Farxiga  5mg  (renewal pending with  [employer]&Me)  Osteoporosis:  Current medications: None Medications tried in the past: Alendronate  (reports severe, intolerable muscle aches)  Current supplements: None  Most recent DEXA: 11/15/21, showed osteoporosis   Objective:  Lab Results  Component Value Date   HGBA1C 8.6 (A) 09/30/2023    Lab Results  Component Value Date   CREATININE 0.58 06/02/2023   BUN 12 06/02/2023   NA 136 06/02/2023   K 3.9 06/02/2023   CL 101 06/02/2023   CO2 26 06/02/2023    Lab Results  Component Value Date   CHOL 219 (H) 06/02/2023   HDL 73.10 06/02/2023   LDLCALC 117 (H) 06/02/2023   LDLDIRECT 105.0 08/27/2021   TRIG 146.0 06/02/2023   CHOLHDL 3 06/02/2023    Medications Reviewed Today     Reviewed by Lionell Jon DEL, RPH (Pharmacist) on 12/14/23 at 1619  Med List Status: <None>   Medication Order Taking? Sig Documenting Provider Last Dose Status Informant  ACCU-CHEK GUIDE TEST test strip [id number]  USE TO TEST BLOOD SUGAR 3 TIMES DAILY Panosh, Wanda K, MD  Active   Accu-Chek Softclix Lancets lancets [id number]  1 each by Other route in the morning, at noon, and at bedtime. Dx E11.9 Panosh, Wanda K, MD  Active   atorvastatin  (LIPITOR) 40 MG tablet [id number]  Take 1 tablet (40 mg total) by mouth every evening. Panosh, Apolinar POUR, MD  Active   BD PEN NEEDLE NANO 2ND GEN 32G X 4 MM MISC [id number]  USE TO INJECT INSULIN  DAILY Webb, Padonda B, FNP  Active   Blood Glucose Monitoring Suppl (ACCU-CHEK GUIDE) w/Device KIT [id number]  Daily use Panosh, Wanda K, MD  Active   Cholecalciferol (VITAMIN D3) 1.25 MG (50000 UT) CAPS [id number]  TAKE ONE CAPSULE BY  MOUTH ONCE A WEEK  Patient not taking: Reported on 09/30/2023   Webb, Padonda B, FNP  Active   Continuous Glucose Sensor (DEXCOM G7 SENSOR) MISC [id number]  1 Device by Does not apply route every 14 (fourteen) days. Panosh, Wanda K, MD  Active   dapagliflozin  propanediol (FARXIGA ) 5 MG TABS tablet [id number]  Take 1 tablet (5 mg total) by mouth daily  before breakfast. Panosh, Wanda K, MD  Active   FIASP  FLEXTOUCH 100 UNIT/ML FlexTouch Pen [id number]  Inject 6 Units into the skin. Once or twice a day [provider]  Active   glucose blood (ACCU-CHEK GUIDE) test strip [id number]  1 each by Other route in the morning, at noon, and at bedtime. Dx E11.9 Panosh, Wanda K, MD  Active   glucose blood test strip [id number]  Use to check blood sugar 1-2 times daily Panosh, Wanda K, MD  Active   insulin  degludec (TRESIBA  FLEXTOUCH) 100 UNIT/ML FlexTouch Pen [id number]  Inject 22 Units into the skin daily. Panosh, Apolinar POUR, MD  Active            Med Note JANEAN JON DEL   Mon May 04, 2023  3:59 PM) 15 units daily  metFORMIN  (GLUCOPHAGE ) 1000 MG tablet [id number]  TAKE ONE TABLET BY MOUTH twice daily Panosh, Wanda K, MD  Active               Assessment/Plan:   Diabetes: - Currently uncontrolled - Reviewed long term cardiovascular and renal outcomes of uncontrolled blood sugar - Reviewed goal A1c, goal fasting, and goal 2 hour post prandial glucose - Reviewed dietary modifications including low carb diet - Recommend to continue current medication therapy, stressed the importance of adherence  -Counseled on proper timing of all medications and tips for remembering missed doses - Patient denies personal or family history of multiple endocrine neoplasia type 2, medullary thyroid  cancer; personal history of pancreatitis or gallbladder disease. - Recommend to check glucose continuously, [facility] as her insurance is no longer contracted with her medical supply store she was using  -Would like to come by office to sign renewal paperwork, will coordinate with CPhT    Follow Up Plan: 2 months Jon DEL Lindau, PharmD Clinical Pharmacist [phone]

## 2023-12-15 ENCOUNTER — Telehealth: Payer: Self-pay

## 2023-12-15 NOTE — Telephone Encounter (Signed)
 Filled and faxed pt and provider portion PAP Novo Nordisk Tresiba ,Fiasp  and pen needles AZ&ME Farxiga  to [facility] Jon pt is coming into office tomorrow.

## 2023-12-16 ENCOUNTER — Other Ambulatory Visit: Payer: Self-pay | Admitting: Internal Medicine

## 2023-12-16 NOTE — Telephone Encounter (Unsigned)
 Copied from CRM [id number]. Topic: Clinical - Prescription Issue >> Dec 16, 2023  4:23 PM Tysheama G wrote: Reason for CRM: Patient is calling regarding her medication ACCU-CHEK GUIDE TEST test strip, pharmacy stated they don't the prescrption. On my end I see it was signed. And this is also what I see  Transmission to pharmacy failed

## 2023-12-17 MED ORDER — ACCU-CHEK GUIDE TEST VI STRP: ORAL_STRIP | 5 refills | Status: AC

## 2023-12-23 ENCOUNTER — Telehealth: Payer: Self-pay

## 2023-12-23 ENCOUNTER — Ambulatory Visit (INDEPENDENT_AMBULATORY_CARE_PROVIDER_SITE_OTHER)

## 2023-12-23 DIAGNOSIS — Z23 Encounter for immunization: Secondary | ICD-10-CM

## 2023-12-23 NOTE — Progress Notes (Signed)
   12/23/2023  Patient ID: [name] [name], female   DOB: January 11, 1951, 73 y.o.   MRN: [id number]  Patient completed her portion of the AZ[employer]Me PAP for Farxiga  2026 renewal. Patient completed her portion of the [employer] PAP for Tresiba /Fiasp /Pen Needles 2026 renewal.  Submitted both applications, including provider portions, to each respective company. Pending decisions.  Jon VEAR Lindau, PharmD Clinical Pharmacist [phone]

## 2023-12-24 NOTE — Telephone Encounter (Signed)
 Pt came by office dropped both pap for AZ&ME and Novo Nordisk [facility] faxed to company.

## 2023-12-28 ENCOUNTER — Other Ambulatory Visit (HOSPITAL_COMMUNITY): Payer: Self-pay

## 2023-12-28 NOTE — Telephone Encounter (Signed)
 Received approval letter from [employer] Farxiga  [facility] 02/09/2025 approval letter index.

## 2024-01-14 ENCOUNTER — Telehealth: Payer: Self-pay | Admitting: Internal Medicine

## 2024-01-14 NOTE — Telephone Encounter (Signed)
 Copied from CRM [id number]. Topic: Clinical - Medication Question >> Jan 14, 2024  4:14 PM Alexandria E wrote: Reason for CRM: Patient called in wanting to reach Jon, the pharmacist. Agent tried calling phone number for Jon and got a busy signal. Please reach out to patient as she said it is a medication question.

## 2024-01-18 NOTE — Telephone Encounter (Signed)
 Attempted to reach patient to return call, unable to reach. Went straight to [employer].  Darrelyn Drum, PharmD, BCPS, CPP Clinical Pharmacist Practitioner [facility] [phone]

## 2024-01-19 NOTE — Telephone Encounter (Signed)
 Returned patient's call. She reports sensors are not covered at [facility], received letter stating that [facility] is in network with her insurance for sensors. Submitted order via parachute.

## 2024-01-26 ENCOUNTER — Encounter: Payer: Self-pay | Admitting: Internal Medicine

## 2024-01-26 ENCOUNTER — Ambulatory Visit: Admitting: Internal Medicine

## 2024-01-26 ENCOUNTER — Other Ambulatory Visit: Payer: Self-pay | Admitting: Internal Medicine

## 2024-01-26 VITALS — BP 138/78 | HR 101 | Temp 97.5°F | Ht 63.0 in | Wt 150.0 lb

## 2024-01-26 DIAGNOSIS — Z79899 Other long term (current) drug therapy: Secondary | ICD-10-CM

## 2024-01-26 DIAGNOSIS — E559 Vitamin D deficiency, unspecified: Secondary | ICD-10-CM

## 2024-01-26 DIAGNOSIS — I10 Essential (primary) hypertension: Secondary | ICD-10-CM

## 2024-01-26 DIAGNOSIS — H547 Unspecified visual loss: Secondary | ICD-10-CM

## 2024-01-26 DIAGNOSIS — E1165 Type 2 diabetes mellitus with hyperglycemia: Secondary | ICD-10-CM

## 2024-01-26 DIAGNOSIS — E785 Hyperlipidemia, unspecified: Secondary | ICD-10-CM

## 2024-01-26 DIAGNOSIS — F439 Reaction to severe stress, unspecified: Secondary | ICD-10-CM

## 2024-01-26 LAB — POCT GLYCOSYLATED HEMOGLOBIN (HGB A1C): Hemoglobin A1C: 8.5 % — AB (ref 4.0–5.6)

## 2024-01-26 NOTE — Patient Instructions (Addendum)
 Good  to see you  A1c is 8.5   Needs to decreased  Bp control average 130/80 below  Bp today is 138/78 ? Check at home and let us  know  readings . If not at goal we may add medication  losartan ?    When get dexcom plan to increase insulin  to get better control   Plan full panel of blood work in April, May.   Add b12 supplement.

## 2024-01-26 NOTE — Progress Notes (Signed)
 Chief Complaint  Patient presents with   Medical Management of Chronic Issues    HPI: [name] [name] 73 y.o. come in for Chronic disease management  DM: taking 15 u tresiba  and 8 u per meal 2 xper day  problem with getting updated cgm that has been helpful in past  had 2 episodes of low bg at night inhaled corticosteroids cautious and fearful of low bgs so may err on side of less insulin  if has a light meal or skipping.  Still on metformin  and jardiance   Hasn't checked bp    was on losartan  in past but off since bp was low .  Feels stress at x mas contributes  vision and financial limitations  No vomiting  or diarrhea  Vision limitations  left eye worse than right  due for eye check but waiting for new year  network information No recent b12 supplement ROS: See pertinent positives and negatives per HPI. No syncope cp sob  stress anxiety around x mas  remote hx of sertraline and alprazo over 20 years ago when having panic attacks  Please check ear  recent sound  no pain of hearing deficit obvious  Past Medical History:  Diagnosis Date   Allergic rhinitis    Anxiety    Diabetes mellitus    Gallstones    Hyperlipidemia    Hypertension    IBS (irritable bowel syndrome)    Normal nuclear stress test 02/10/2006   low risk study    Obesity    Sciatica     Family History  Problem Relation Age of Onset   Acute myelogenous leukemia Mother        died when pat age 66    Diabetes Father    Lung cancer Maternal Grandmother    Skin cancer Maternal Grandfather    Breast cancer Paternal Grandmother    Diabetes Paternal Grandfather     Social History   Socioeconomic History   Marital status: Divorced    Spouse name: Not on file   Number of children: 3   Years of education: Not on file   Highest education level: Not on file  Occupational History   Occupation: retired  Tobacco Use   Smoking status: Never   Smokeless tobacco: Never  Substance and Sexual Activity   Alcohol  use: No   Drug use: No   Sexual activity: Not on file  Other Topics Concern   Not on file  Social History Narrative   Separated single mom   HH of 4   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          Social Drivers of Health   Tobacco Use: Low Risk (01/26/2024)   Patient History    Smoking Tobacco Use: Never    Smokeless Tobacco Use: Never    Passive Exposure: Not on file  Financial Resource Strain: Low Risk (10/03/2022)   Overall Financial Resource Strain (CARDIA)    Difficulty of Paying Living Expenses: Not hard at all  Food Insecurity: No Food Insecurity (10/03/2022)   Hunger Vital Sign    Worried About Running Out of Food in the Last Year: Never true    Ran Out of Food in the Last Year:  Never true  Transportation Needs: No Transportation Needs (10/03/2022)   PRAPARE - Administrator, Civil Service (Medical): No    Lack of Transportation (Non-Medical): No  Physical Activity: Inactive (10/03/2022)   Exercise Vital Sign    Days of Exercise per Week: 0 days    Minutes of Exercise per Session: 0 min  Stress: No Stress Concern Present (10/03/2022)   [employer] of Occupational Health - Occupational Stress Questionnaire    Feeling of Stress : Not at all  Social Connections: Socially Isolated (10/03/2022)   Social Connection and Isolation Panel    Frequency of Communication with Friends and Family: More than three times a week    Frequency of Social Gatherings with Friends and Family: More than three times a week    Attends Religious Services: Never    Database Administrator or Organizations: No    Attends Banker Meetings: Never    Marital Status: Divorced  Depression (PHQ2-9): Low Risk (01/26/2024)   Depression (PHQ2-9)    PHQ-2 Score: 0  Alcohol Screen: Low Risk  (10/03/2022)   Alcohol Screen    Last Alcohol Screening Score (AUDIT): 0  Housing: Low Risk (10/03/2022)   Housing    Last Housing Risk Score: 0  Utilities: Not At Risk (10/03/2022)   AHC Utilities    Threatened with loss of utilities: No  Health Literacy: Adequate Health Literacy (10/03/2022)   B1300 Health Literacy    Frequency of need for help with medical instructions: Never    Outpatient Medications Prior to Visit  Medication Sig Dispense Refill   Accu-Chek Softclix Lancets lancets 1 each by Other route in the morning, at noon, and at bedtime. Dx E11.9 100 each 6   atorvastatin  (LIPITOR) 40 MG tablet Take 1 tablet (40 mg total) by mouth every evening. 90 tablet 1   BD PEN NEEDLE NANO 2ND GEN 32G X 4 MM MISC USE TO INJECT INSULIN  DAILY 100 each 5   Blood Glucose Monitoring Suppl (ACCU-CHEK GUIDE) w/Device KIT Daily use 1 kit 0   Continuous Glucose Sensor (DEXCOM G7 SENSOR) MISC Use to monitor blood sugar continuously. Change sensor every 10 days as instructed. Dx E11.69 uses insulin  up to 4 times daily 2 each 5   dapagliflozin  propanediol (FARXIGA ) 5 MG TABS tablet Take 1 tablet (5 mg total) by mouth daily before breakfast. 90 tablet 0   FIASP  FLEXTOUCH 100 UNIT/ML FlexTouch Pen Inject 6 Units into the skin. Once or twice a day     glucose blood (ACCU-CHEK GUIDE TEST) test strip Use as instructed 100 strip 5   glucose blood (ACCU-CHEK GUIDE) test strip 1 each by Other route in the morning, at noon, and at bedtime. Dx E11.9 100 each 6   glucose blood test strip Use to check blood sugar 1-2 times daily 100 each 3   insulin  degludec (TRESIBA  FLEXTOUCH) 100 UNIT/ML FlexTouch Pen Inject 22 Units into the skin daily. 3 mL 0   metFORMIN  (GLUCOPHAGE ) 1000 MG tablet TAKE ONE TABLET BY MOUTH twice daily 180 tablet 3   Cholecalciferol (VITAMIN D3) 1.25 MG (50000 UT) CAPS TAKE ONE CAPSULE BY MOUTH ONCE A WEEK (Patient not taking: Reported on 01/26/2024) 12 capsule 2   No facility-administered  medications prior to visit.     EXAM:  BP 138/78 (BP Location: Right Arm)   Pulse (!) 101   Temp (!) 97.5 F (36.4 C) (Oral)   Ht 5' 3 (1.6 m)   Wt 150  lb (68 kg)   SpO2 99%   BMI 26.57 kg/m   Body mass index is 26.57 kg/m.  GENERAL: vitals reviewed and listed above, alert, oriented, appears well hydrated and in no acute distress HEENT: atraumatic, conjunctiva  clear, no obvious abnormalities on inspection of external nose and ears tms intact light reflex some hair  grown in distal OP : no lesion edema or exudate  NECK: no obvious masses on inspection palpation  LUNGS: clear to auscultation bilaterally, no wheezes, rales or rhonchi, good air movement CV: HRRR, no clubbing cyanosis or  peripheral edema nl cap refill  MS: moves all extremities without noticeable focal  abnormality PSYCH: pleasant and cooperative, no obvious depression or anxiety Lab Results  Component Value Date   WBC 7.1 06/02/2023   HGB 15.2 (H) 06/02/2023   HCT 44.2 06/02/2023   PLT 350.0 06/02/2023   GLUCOSE 168 (H) 06/02/2023   CHOL 219 (H) 06/02/2023   TRIG 146.0 06/02/2023   HDL 73.10 06/02/2023   LDLDIRECT 105.0 08/27/2021   LDLCALC 117 (H) 06/02/2023   ALT 9 06/02/2023   AST 15 06/02/2023   NA 136 06/02/2023   K 3.9 06/02/2023   CL 101 06/02/2023   CREATININE 0.58 06/02/2023   BUN 12 06/02/2023   CO2 26 06/02/2023   TSH 1.43 06/02/2023   HGBA1C 8.5 (A) 01/26/2024   MICROALBUR 3.1 (H) 06/02/2023   BP Readings from Last 3 Encounters:  01/26/24 138/78  09/30/23 104/70  06/02/23 130/82   Last vitamin D  Lab Results  Component Value Date   VD25OH 46.76 06/02/2023   Lab Results  Component Value Date   VITAMINB12 239 07/01/2022    ASSESSMENT AND PLAN:  Discussed the following assessment and plan:  Type 2 diabetes mellitus with hyperglycemia, with long-term current use of insulin  (HCC) - Plan: POC HgB A1c  Medication management  Essential hypertension - currently off meds  losartan    ( was low in past)  may need to restart if stays up.  Hyperlipidemia, unspecified hyperlipidemia type  Impaired vision - cannot drive  interferes with  life  ie transportation shopping  interactions etc.  Stress Send in readings  bp   if not at goal we can add back low doses losartan  that she has used before.  Situational anxiety stress( every x mas time) at this time no med to use.  Get eye appt   as planned . I think that getting cgm will be helpful  as she is apprehensive about getting low BGs as appropriate  which is interfering with optimum control .  Mentions financial strain also .  Alluded to poss  transport if needed  pre appt  planned . SABRA Mentioned counseling if needed  at this time  no med and will monitor Add b12  ...low normal last level  Diabetes is till not  optimally controlled  she is fearful of hypoglycemia and  if back on a cgm she thinks can do better .  Insulin  adjustments  she seems to skip meal insulin  iif slight  eating such as a sandwich    consider using  1/2 dose but can discuss with Jon also.   -Patient advised to return or notify health care team  if  new concerns arise.  Patient Instructions  Good  to see you  A1c is 8.5   Needs to decreased  Bp control average 130/80 below  Bp today is 138/78 ? Check at home and let us  know  readings .  If not at goal we may add medication  losartan ?    When get dexcom plan to increase insulin  to get better control   Plan full panel of blood work in April, May.   Add b12 supplement.   Kelie Gainey K. Shemeika Starzyk M.D.

## 2024-01-26 NOTE — Progress Notes (Signed)
 Last vitamin D  Lab Results  Component Value Date   VD25OH 46.76 06/02/2023

## 2024-02-08 ENCOUNTER — Telehealth: Payer: Self-pay

## 2024-02-08 DIAGNOSIS — I10 Essential (primary) hypertension: Secondary | ICD-10-CM

## 2024-02-08 MED ORDER — LOSARTAN POTASSIUM 25 MG PO TABS: 12.5000 mg | ORAL_TABLET | Freq: Every day | ORAL | 0 refills | Status: AC

## 2024-02-08 NOTE — Progress Notes (Signed)
" ° °  02/08/2024  Patient ID: [name] [name], female   DOB: Aug 20, 1950, 73 y.o.   MRN: [id number]  Discussed recently elevated BP with patient at PCP request. Patient willing to go back on losartan  25mg  1/2 tab daily to help with BP and help with kidney protection in presence of diabetes. Sending in rx order.  The patients has a diagnosis of hypertension and it is medically necessary for them to have access to a home device to monitor blood pressure.  The patient does not have readily available insurance access to a device and cannot afford to purchase a device at this time.  The patient has been counseled that they do not need to continue to receive services from [facility] to receive a device.  The patient will be given a device free of charge.  Jon VEAR Lindau, PharmD Clinical Pharmacist [phone]   "

## 2024-02-15 ENCOUNTER — Other Ambulatory Visit (HOSPITAL_COMMUNITY): Payer: Self-pay

## 2024-02-15 NOTE — Telephone Encounter (Signed)
 Received approval letter from [employer] thru 02/09/2025 on Tresiba /pen needles,approval letter index.

## 2024-02-17 ENCOUNTER — Telehealth: Payer: Self-pay

## 2024-02-17 NOTE — Progress Notes (Signed)
" ° °  02/17/2024  Patient ID: [name] [name], female   DOB: 04-26-1950, 74 y.o.   MRN: [id number]  Received refill request from [employer] for patient's pen needles via PAP.  Faxed in a 4 month order.  Jon VEAR Lindau, PharmD Clinical Pharmacist [phone]  "

## 2024-02-19 NOTE — Telephone Encounter (Signed)
 Received approval letter from AZ[employer]ME Farxiga  thru 02/09/2025 approval letter index.

## 2024-02-23 ENCOUNTER — Telehealth: Payer: Self-pay

## 2024-02-23 NOTE — Telephone Encounter (Signed)
 Contacted pt and inform her that we received Patient Assistant Program supplies on Novofine needle. It will be ready to be pick at the front desk. Pt verbalized understanding.

## 2024-02-24 ENCOUNTER — Telehealth: Payer: Self-pay

## 2024-02-24 ENCOUNTER — Other Ambulatory Visit (INDEPENDENT_AMBULATORY_CARE_PROVIDER_SITE_OTHER)

## 2024-02-24 DIAGNOSIS — Z794 Long term (current) use of insulin: Secondary | ICD-10-CM

## 2024-02-24 DIAGNOSIS — E1165 Type 2 diabetes mellitus with hyperglycemia: Secondary | ICD-10-CM

## 2024-02-24 NOTE — Progress Notes (Signed)
 "   02/24/2024 Name: [name] [name] MRN: [id number] DOB: [birth year]  Chief Complaint  Patient presents with   Medication Management   Diabetes   Hypertension    [name] [name] is a 74 y.o. year old female who presented for a telephone visit.   They were referred to the pharmacist by their PCP for assistance in managing diabetes and complex medication management.    Subjective:  Care Team: Primary Care Provider: Charlett Apolinar POUR, MD ;   Medication Access/Adherence  Current Pharmacy:  [phone] - [place], [place] - [address] [place] [address] Phone: [phone] Fax: [phone]  [facility] - [place], [place] - [address] [place] [address] Phone: [phone] Fax: [phone]   Patient reports affordability concerns with their medications: No Patient reports access/transportation concerns to their pharmacy: Yes  - relies on son for transport, okay with this at this time Patient reports adherence concerns with their medications:  No - reports she has been much better about remembering her medications   Diabetes:  Current medications: Farxiga  10mg , Fiasp  10 units once to twice daily with meal (skips second injection if only one meal that day), Metformin , Tresiba  15 units daily Medications tried in the past: Glimepiride , Victoza , Actos   Current glucose readings:  Reports improved glucose readings since increasing fiasp  and been better about remembering her medications. No specific readings to provide today.   Observed patterns:  Patient denies hypoglycemic s/sx including dizziness, shakiness, sweating. Patient denies hyperglycemic symptoms including polyuria, polydipsia, polyphagia, nocturia, neuropathy, blurred vision.  Current meal patterns:  -Only eats 1-2 meals per day   Current medication access support: Tresiba /Fiasp /Pen needles (NOVO), Farxiga  5mg  (renewal pending with  [facility])  Hypertension:  Current medications: Losartan  25mg  1/2 tab daily Medications previously tried: None  Patient has a validated, automated, upper arm home BP cuff Current blood pressure readings readings: not checking yet  Patient denies hypotensive s/sx including dizziness, lightheadedness.  Patient denies hypertensive symptoms including headache, chest pain, shortness of breath     Objective:  Lab Results  Component Value Date   HGBA1C 8.5 (A) 01/26/2024    Lab Results  Component Value Date   CREATININE 0.58 06/02/2023   BUN 12 06/02/2023   NA 136 06/02/2023   K 3.9 06/02/2023   CL 101 06/02/2023   CO2 26 06/02/2023    Lab Results  Component Value Date   CHOL 219 (H) 06/02/2023   HDL 73.10 06/02/2023   LDLCALC 117 (H) 06/02/2023   LDLDIRECT 105.0 08/27/2021   TRIG 146.0 06/02/2023   CHOLHDL 3 06/02/2023    Medications Reviewed Today     Reviewed by Lionell Jon DEL, RPH (Pharmacist) on 02/24/24 at 1510  Med List Status: <None>   Medication Order Taking? Sig Documenting Provider Last Dose Status Informant  Accu-Chek Softclix Lancets lancets [id number]  1 each by Other route in the morning, at noon, and at bedtime. Dx E11.9 Panosh, Wanda K, MD  Active   atorvastatin  (LIPITOR) 40 MG tablet [id number]  Take 1 tablet (40 mg total) by mouth every evening. Panosh, Apolinar POUR, MD  Active   BD PEN NEEDLE NANO 2ND GEN 32G X 4 MM MISC [id number]  USE TO INJECT INSULIN  DAILY Webb, Padonda B, FNP  Active   Blood Glucose Monitoring Suppl (ACCU-CHEK GUIDE) w/Device KIT [id number]  Daily use Panosh, Wanda K, MD  Active   Cholecalciferol (VITAMIN D3) 1.25 MG (50000 UT) CAPS  [id number]  TAKE ONE CAPSULE BY MOUTH ONCE A WEEK  Patient not taking: Reported on 01/26/2024   Douglass Kenney NOVAK, FNP  Active   Continuous Glucose Sensor (DEXCOM G7 SENSOR) [place] [id number]  Use to monitor blood sugar continuously. Change sensor every 10 days as instructed. Dx E11.69 uses insulin  up to 4 times  daily Panosh, Wanda K, MD  Active   dapagliflozin  propanediol (FARXIGA ) 5 MG TABS tablet [id number]  Take 1 tablet (5 mg total) by mouth daily before breakfast. Panosh, Wanda K, MD  Active   FIASP  FLEXTOUCH 100 UNIT/ML FlexTouch Pen [id number]  Inject 6 Units into the skin. Once or twice a day [provider]  Active   glucose blood (ACCU-CHEK GUIDE TEST) test strip [id number]  Use as instructed Panosh, Wanda K, MD  Active   glucose blood (ACCU-CHEK GUIDE) test strip [id number]  1 each by Other route in the morning, at noon, and at bedtime. Dx E11.9 Panosh, Wanda K, MD  Active   glucose blood test strip [id number]  Use to check blood sugar 1-2 times daily Panosh, Wanda K, MD  Active   insulin  degludec (TRESIBA  FLEXTOUCH) 100 UNIT/ML FlexTouch Pen [id number]  Inject 22 Units into the skin daily. Panosh, Apolinar POUR, MD  Active            Med Note JANEAN JON DEL   Mon May 04, 2023  3:59 PM) 15 units daily  losartan  (COZAAR ) 25 MG tablet [id number] Yes Take 0.5 tablets (12.5 mg total) by mouth daily. Panosh, Apolinar POUR, MD  Active   metFORMIN  (GLUCOPHAGE ) 1000 MG tablet [id number] Yes TAKE ONE TABLET BY MOUTH twice daily Panosh, Wanda K, MD  Active               Assessment/Plan:   Diabetes: - Currently uncontrolled - Reviewed long term cardiovascular and renal outcomes of uncontrolled blood sugar - Reviewed goal A1c, goal fasting, and goal 2 hour post prandial glucose - Reviewed dietary modifications including low carb diet - Recommend to continue current medication therapy, stressed the importance of adherence  -Counseled on proper timing of all medications and tips for remembering missed doses - Patient denies personal or family history of multiple endocrine neoplasia type 2, medullary thyroid  cancer; personal history of pancreatitis or gallbladder disease. - Recommend to check glucose continuously, [facility] as her insurance is no longer contracted with her  medical supply store she was using   Hypertension: - Currently uncontrolled - Reviewed long term cardiovascular and renal outcomes of uncontrolled blood pressure - Reviewed appropriate blood pressure monitoring technique and reviewed goal blood pressure. Recommended to check home blood pressure and heart rate 2-3x/week - Recommend to continue current med therapy         Follow Up Plan: 4 weeks  Jon DEL [place], PharmD Clinical Pharmacist [phone]     "

## 2024-02-24 NOTE — Progress Notes (Signed)
" ° °  02/24/2024  Patient ID: [name] [name], female   DOB: October 05, 1950, 74 y.o.   MRN: [id number]  error "

## 2024-03-22 ENCOUNTER — Ambulatory Visit: Admitting: Family Medicine

## 2024-03-23 ENCOUNTER — Other Ambulatory Visit
